# Patient Record
Sex: Female | Born: 1960
Health system: Southern US, Community
[De-identification: ages and names within clinical notes are randomized; demographics above are authoritative.]

## PROBLEM LIST (undated history)

## (undated) DIAGNOSIS — E049 Nontoxic goiter, unspecified: Secondary | ICD-10-CM

## (undated) DIAGNOSIS — E785 Hyperlipidemia, unspecified: Secondary | ICD-10-CM

## (undated) DIAGNOSIS — N189 Chronic kidney disease, unspecified: Secondary | ICD-10-CM

## (undated) DIAGNOSIS — M1990 Unspecified osteoarthritis, unspecified site: Secondary | ICD-10-CM

## (undated) DIAGNOSIS — R030 Elevated blood-pressure reading, without diagnosis of hypertension: Secondary | ICD-10-CM

## (undated) DIAGNOSIS — K449 Diaphragmatic hernia without obstruction or gangrene: Secondary | ICD-10-CM

## (undated) DIAGNOSIS — E079 Disorder of thyroid, unspecified: Secondary | ICD-10-CM

## (undated) DIAGNOSIS — H43393 Other vitreous opacities, bilateral: Secondary | ICD-10-CM

## (undated) DIAGNOSIS — M7070 Other bursitis of hip, unspecified hip: Secondary | ICD-10-CM

## (undated) DIAGNOSIS — K219 Gastro-esophageal reflux disease without esophagitis: Secondary | ICD-10-CM

## (undated) DIAGNOSIS — N8003 Adenomyosis of the uterus: Secondary | ICD-10-CM

## (undated) DIAGNOSIS — D126 Benign neoplasm of colon, unspecified: Secondary | ICD-10-CM

## (undated) DIAGNOSIS — N200 Calculus of kidney: Secondary | ICD-10-CM

## (undated) DIAGNOSIS — J302 Other seasonal allergic rhinitis: Secondary | ICD-10-CM

## (undated) DIAGNOSIS — L940 Localized scleroderma [morphea]: Secondary | ICD-10-CM

## (undated) DIAGNOSIS — T7840XA Allergy, unspecified, initial encounter: Secondary | ICD-10-CM

## (undated) DIAGNOSIS — H35719 Central serous chorioretinopathy, unspecified eye: Secondary | ICD-10-CM

## (undated) DIAGNOSIS — F419 Anxiety disorder, unspecified: Secondary | ICD-10-CM

## (undated) DIAGNOSIS — D649 Anemia, unspecified: Secondary | ICD-10-CM

## (undated) DIAGNOSIS — N800 Endometriosis of uterus: Secondary | ICD-10-CM

## (undated) DIAGNOSIS — N809 Endometriosis, unspecified: Secondary | ICD-10-CM

## (undated) DIAGNOSIS — K589 Irritable bowel syndrome without diarrhea: Secondary | ICD-10-CM

## (undated) DIAGNOSIS — I10 Essential (primary) hypertension: Secondary | ICD-10-CM

## (undated) HISTORY — DX: Central serous chorioretinopathy, unspecified eye: H35.719

## (undated) HISTORY — DX: Irritable bowel syndrome, unspecified: K58.9

## (undated) HISTORY — DX: Chronic kidney disease, unspecified: N18.9

## (undated) HISTORY — PX: WISDOM TOOTH EXTRACTION: SHX21

## (undated) HISTORY — DX: Anxiety disorder, unspecified: F41.9

## (undated) HISTORY — DX: Allergy, unspecified, initial encounter: T78.40XA

## (undated) HISTORY — PX: UPPER GI ENDOSCOPY: SHX6162

## (undated) HISTORY — DX: Nontoxic goiter, unspecified: E04.9

## (undated) HISTORY — DX: Gastro-esophageal reflux disease without esophagitis: K21.9

## (undated) HISTORY — DX: Anemia, unspecified: D64.9

## (undated) HISTORY — DX: Hyperlipidemia, unspecified: E78.5

## (undated) HISTORY — DX: Diaphragmatic hernia without obstruction or gangrene: K44.9

## (undated) HISTORY — PX: TONSILLECTOMY: SUR1361

## (undated) HISTORY — DX: Elevated blood-pressure reading, without diagnosis of hypertension: R03.0

## (undated) HISTORY — DX: Endometriosis of uterus: N80.0

## (undated) HISTORY — DX: Benign neoplasm of colon, unspecified: D12.6

## (undated) HISTORY — PX: COLONOSCOPY: SHX174

## (undated) HISTORY — DX: Essential (primary) hypertension: I10

## (undated) HISTORY — DX: Disorder of thyroid, unspecified: E07.9

## (undated) HISTORY — DX: Adenomyosis of the uterus: N80.03

## (undated) HISTORY — DX: Localized scleroderma (morphea): L94.0

## (undated) HISTORY — DX: Calculus of kidney: N20.0

## (undated) HISTORY — PX: POLYPECTOMY: SHX149

## (undated) HISTORY — DX: Endometriosis, unspecified: N80.9

## (undated) HISTORY — DX: Unspecified osteoarthritis, unspecified site: M19.90

## (undated) HISTORY — PX: MENISCUS REPAIR: SHX5179

---

## 1998-06-19 ENCOUNTER — Other Ambulatory Visit: Admission: RE | Admit: 1998-06-19 | Discharge: 1998-06-19 | Payer: Self-pay | Admitting: Obstetrics and Gynecology

## 1999-01-26 ENCOUNTER — Ambulatory Visit (HOSPITAL_COMMUNITY): Admission: RE | Admit: 1999-01-26 | Discharge: 1999-01-26 | Payer: Self-pay | Admitting: Family Medicine

## 1999-01-26 ENCOUNTER — Encounter: Payer: Self-pay | Admitting: Family Medicine

## 2000-04-15 ENCOUNTER — Other Ambulatory Visit: Admission: RE | Admit: 2000-04-15 | Discharge: 2000-04-15 | Payer: Self-pay | Admitting: Obstetrics and Gynecology

## 2001-07-10 ENCOUNTER — Encounter: Payer: Self-pay | Admitting: Obstetrics and Gynecology

## 2001-07-10 ENCOUNTER — Ambulatory Visit (HOSPITAL_COMMUNITY): Admission: RE | Admit: 2001-07-10 | Discharge: 2001-07-10 | Payer: Self-pay | Admitting: Obstetrics and Gynecology

## 2002-06-02 ENCOUNTER — Ambulatory Visit (HOSPITAL_COMMUNITY): Admission: RE | Admit: 2002-06-02 | Discharge: 2002-06-02 | Payer: Self-pay | Admitting: Obstetrics and Gynecology

## 2002-06-02 ENCOUNTER — Encounter: Payer: Self-pay | Admitting: Obstetrics and Gynecology

## 2003-05-24 ENCOUNTER — Encounter: Payer: Self-pay | Admitting: Obstetrics and Gynecology

## 2003-05-24 ENCOUNTER — Ambulatory Visit (HOSPITAL_COMMUNITY): Admission: RE | Admit: 2003-05-24 | Discharge: 2003-05-24 | Payer: Self-pay | Admitting: Obstetrics and Gynecology

## 2003-06-15 ENCOUNTER — Ambulatory Visit (HOSPITAL_COMMUNITY): Admission: RE | Admit: 2003-06-15 | Discharge: 2003-06-15 | Payer: Self-pay | Admitting: Obstetrics and Gynecology

## 2003-06-15 ENCOUNTER — Encounter: Payer: Self-pay | Admitting: Obstetrics and Gynecology

## 2004-07-11 ENCOUNTER — Ambulatory Visit (HOSPITAL_COMMUNITY): Admission: RE | Admit: 2004-07-11 | Discharge: 2004-07-11 | Payer: Self-pay | Admitting: Obstetrics and Gynecology

## 2005-08-01 ENCOUNTER — Ambulatory Visit (HOSPITAL_COMMUNITY): Admission: RE | Admit: 2005-08-01 | Discharge: 2005-08-01 | Payer: Self-pay | Admitting: Obstetrics and Gynecology

## 2006-08-11 ENCOUNTER — Ambulatory Visit (HOSPITAL_COMMUNITY): Admission: RE | Admit: 2006-08-11 | Discharge: 2006-08-11 | Payer: Self-pay | Admitting: Obstetrics and Gynecology

## 2007-08-13 ENCOUNTER — Ambulatory Visit (HOSPITAL_COMMUNITY): Admission: RE | Admit: 2007-08-13 | Discharge: 2007-08-13 | Payer: Self-pay | Admitting: Obstetrics and Gynecology

## 2008-09-13 ENCOUNTER — Ambulatory Visit (HOSPITAL_COMMUNITY): Admission: RE | Admit: 2008-09-13 | Discharge: 2008-09-13 | Payer: Self-pay | Admitting: Obstetrics and Gynecology

## 2008-09-28 ENCOUNTER — Encounter: Admission: RE | Admit: 2008-09-28 | Discharge: 2008-09-28 | Payer: Self-pay | Admitting: Obstetrics and Gynecology

## 2009-10-16 ENCOUNTER — Ambulatory Visit (HOSPITAL_COMMUNITY): Admission: RE | Admit: 2009-10-16 | Discharge: 2009-10-16 | Payer: Self-pay | Admitting: Obstetrics and Gynecology

## 2010-06-27 ENCOUNTER — Encounter: Admission: RE | Admit: 2010-06-27 | Discharge: 2010-06-27 | Payer: Self-pay | Admitting: Family Medicine

## 2010-10-30 ENCOUNTER — Ambulatory Visit (HOSPITAL_COMMUNITY)
Admission: RE | Admit: 2010-10-30 | Discharge: 2010-10-30 | Payer: Self-pay | Source: Home / Self Care | Attending: Obstetrics and Gynecology | Admitting: Obstetrics and Gynecology

## 2011-01-03 ENCOUNTER — Encounter (INDEPENDENT_AMBULATORY_CARE_PROVIDER_SITE_OTHER): Payer: Self-pay | Admitting: *Deleted

## 2011-01-08 NOTE — Letter (Signed)
Summary: New Patient letter  [facility]  [address]   [place], [place] [address]   Phone: [phone]  Fax: [phone]       01/03/2011 MRN: [id number]  [facility] [address] [place], [place]  [address]  Dear Ms. Crissman,  Welcome to the Gastroenterology Division at [facility].    You are scheduled to see Dr.  Russella Dar on 02/15/2011 at 3:30 on the 3rd floor at [facility], [address]  We ask that you try to arrive at our office 15 minutes prior to your appointment time to allow for check-in.  We would like you to complete the enclosed self-administered evaluation form prior to your visit and bring it with you on the day of your appointment.  We will review it with you.  Also, please bring a complete list of all your medications or, if you prefer, bring the medication bottles and we will list them.  Please bring your insurance card so that we may make a copy of it.  If your insurance requires a referral to see a specialist, please bring your referral form from your primary care physician.  Co-payments are due at the time of your visit and may be paid by cash, check or credit card.     Your office visit will consist of a consult with your physician (includes a physical exam), any laboratory testing he/she may order, scheduling of any necessary diagnostic testing (e.g. x-ray, ultrasound, CT-scan), and scheduling of a procedure (e.g. Endoscopy, Colonoscopy) if required.  Please allow enough time on your schedule to allow for any/all of these possibilities.    If you cannot keep your appointment, please call [phone] to cancel or reschedule prior to your appointment date.  This allows [place] the opportunity to schedule an appointment for another patient in need of care.  If you do not cancel or reschedule by 5 p.m. the business day prior to your appointment date, you will be charged a $50.00 late cancellation/no-show fee.    Thank you for choosing [facility] for your medical needs.  We appreciate the opportunity to care for you.  Please visit [place] at our website  to learn more about our practice.                     Sincerely,                                                             The Gastroenterology Division

## 2011-02-15 ENCOUNTER — Ambulatory Visit: Payer: Self-pay | Admitting: Gastroenterology

## 2011-03-15 ENCOUNTER — Telehealth: Payer: Self-pay | Admitting: Gastroenterology

## 2011-03-15 NOTE — Telephone Encounter (Signed)
Wanted to make sure patient has not had a recent work up for her abdominal pain. The reason we have for her coming in to the office next week was consult for gallbladder. Patient states she has not had a recent work up and the only work up she has had was with [place] in 1995.

## 2011-03-20 ENCOUNTER — Ambulatory Visit (INDEPENDENT_AMBULATORY_CARE_PROVIDER_SITE_OTHER): Payer: BC Managed Care – PPO | Admitting: Gastroenterology

## 2011-03-20 ENCOUNTER — Encounter: Payer: Self-pay | Admitting: Gastroenterology

## 2011-03-20 VITALS — BP 124/80 | HR 72 | Ht 62.0 in | Wt 126.0 lb

## 2011-03-20 DIAGNOSIS — R1011 Right upper quadrant pain: Secondary | ICD-10-CM

## 2011-03-20 DIAGNOSIS — Z8371 Family history of colonic polyps: Secondary | ICD-10-CM

## 2011-03-20 DIAGNOSIS — Z800 Family history of malignant neoplasm of digestive organs: Secondary | ICD-10-CM

## 2011-03-20 MED ORDER — HYOSCYAMINE SULFATE 0.125 MG SL SUBL: SUBLINGUAL_TABLET | SUBLINGUAL | Status: DC

## 2011-03-20 MED ORDER — PEG-KCL-NACL-NASULF-NA ASC-C 100 G PO SOLR: 1.0000 | Freq: Once | ORAL | Status: AC

## 2011-03-20 NOTE — Patient Instructions (Addendum)
You have been scheduled for a Colonoscopy. Separate instructions given to patient. You have also been scheduled for a Abdominal Ultrasound at [facility] on 03/25/11 at 8:30am. Nothing to eat or drink after midnight.  Your prescription has been sent to your pharmacy.  cc. Leonides Sake, MD

## 2011-03-20 NOTE — Progress Notes (Signed)
History of Present Illness: This is a 50 year old woman that I seen in the past. In addition she has been by Dr. Garnette Czech our. He has chronic right-sided abdominal pain that radiates along her right costal margin to her right flank and back. Her symptoms are sometimes associated with abdominal bloating and belching. They have been fairly constant problem for over 20 years. Around 1990 she was evaluated with an oral cholecystogram a HIDA scan and upper endoscopy small bowel series and a CT scan of the abdomen and pelvis which were all unremarkable. She was reevaluated in 1994 with an abdominal ultrasound and a CCP I an ERCP which were all unremarkable. Her symptoms slightly with anti-spasmodic. There are frequently improved when she lays on her right side. They still correlate with bloating and belching occasionally but occasionally occur on her own. Her symptoms are exacerbated by sweet tea, nuts and fried foods. She has mild chronic constipation. She has had several colonoscopies by Dr. West Carbo due to family history of colon polyps and colon cancer. Her last colonoscopy was was in 2007 she states she is due for followup. She denies vomiting, dysphagia, odynophagia, change in bowel habits, change in stool caliber, melena and hematochezia.  . Past Medical History  Diagnosis Date  . IBS (irritable bowel syndrome)   . Hiatal hernia   . Hemorrhoids    Past Surgical History  Procedure Date  . Tonsillectomy     reports that she has never smoked. She has never used smokeless tobacco. She reports that she drinks alcohol. She reports that she does not use illicit drugs. family history includes Colon cancer in her paternal grandfather; Colon polyps in her brother; and Diabetes in her father. No Known Allergies  Outpatient Encounter Prescriptions as of 03/20/2011  Medication Sig Dispense Refill  . KRILL OIL PO Take by mouth daily.        . Multiple Vitamin (MULTIVITAMIN PO) Take by mouth daily.          Marland Kitchen VITAMIN D, ERGOCALCIFEROL, PO Take by mouth daily.        . hyoscyamine (LEVSIN/SL) 0.125 MG SL tablet 1-2 tablets by mouth four times a day as needed  100 tablet  5  . peg 3350 powder (MOVIPREP) 100 G SOLR Take 1 kit (100 g total) by mouth once.  1 kit  0    Review of Systems: Pertinent positive and negative review of systems were noted in the above HPI section. All other review of systems were otherwise negative.  Physical Exam: General: Well developed , well nourished, no acute distress Head: Normocephalic and atraumatic Eyes:  sclerae anicteric, EOMI Ears: Normal auditory acuity Mouth: No deformity or lesions Neck: Supple, no masses or thyromegaly Lungs: Clear throughout to auscultation. Mild tenderness along her right lateral costal margin Heart: Regular rate and rhythm; no murmurs, rubs or bruits Abdomen: Soft, non tender and non distended. No masses, hepatosplenomegaly or hernias noted. Normal Bowel sounds Rectal: Deferred to colonoscopy Musculoskeletal: Symmetrical with no gross deformities  Skin: No lesions on visible extremities Pulses:  Normal pulses noted Extremities: No clubbing, cyanosis, edema or deformities noted Neurological: Alert oriented x 4, grossly nonfocal Cervical Nodes:  No significant cervical adenopathy Inguinal Nodes: No significant inguinal adenopathy Psychological:  Alert and cooperative. Normal mood and affect  Assessment and Recommendations:  1. Chronic right upper quadrant, right costal margin, right flank and right back pain. Her symptoms are occasionally exacerbated by certain dietary stressors and gas. The etiology remains unclear. She  may have a component of abdominal wall pain and/or intestinal gas with bowel spasm. Given that it has been many years since she was evaluated will proceed with an abdominal ultrasound to rule out cholelithiasis and other lesions. Consider a repeat [facility] HIDA scan. Trial of hyoscyamine 1 to 2, 4 times a day when  necessary. Trial of Advil 400-600 mg 3 times a day when necessary  2. Family history of colon polyps in her brother and family history of colon cancer in her paternal grandfather. She has been maintained on a 5 year screening colonoscopy program. Schedule colonoscopy. The risks, benefits, and alternatives to colonoscopy with possible biopsy and possible polypectomy were discussed with the patient and they consent to proceed.

## 2011-03-21 ENCOUNTER — Encounter: Payer: Self-pay | Admitting: Gastroenterology

## 2011-03-25 ENCOUNTER — Ambulatory Visit (HOSPITAL_COMMUNITY)
Admission: RE | Admit: 2011-03-25 | Discharge: 2011-03-25 | Disposition: A | Discharge status: Home / Self Care | Payer: BC Managed Care – PPO | Source: Ambulatory Visit | Attending: Gastroenterology | Admitting: Gastroenterology

## 2011-03-25 DIAGNOSIS — R1011 Right upper quadrant pain: Secondary | ICD-10-CM

## 2011-04-21 DIAGNOSIS — D126 Benign neoplasm of colon, unspecified: Secondary | ICD-10-CM

## 2011-04-21 HISTORY — DX: Benign neoplasm of colon, unspecified: D12.6

## 2011-05-03 ENCOUNTER — Other Ambulatory Visit: Payer: BC Managed Care – PPO | Admitting: Gastroenterology

## 2011-05-03 ENCOUNTER — Encounter: Payer: Self-pay | Admitting: Gastroenterology

## 2011-05-03 ENCOUNTER — Ambulatory Visit (AMBULATORY_SURGERY_CENTER): Payer: BC Managed Care – PPO | Admitting: Gastroenterology

## 2011-05-03 DIAGNOSIS — Z800 Family history of malignant neoplasm of digestive organs: Secondary | ICD-10-CM

## 2011-05-03 DIAGNOSIS — D126 Benign neoplasm of colon, unspecified: Secondary | ICD-10-CM

## 2011-05-03 DIAGNOSIS — K648 Other hemorrhoids: Secondary | ICD-10-CM

## 2011-05-03 DIAGNOSIS — Z8371 Family history of colonic polyps: Secondary | ICD-10-CM

## 2011-05-03 DIAGNOSIS — Z1211 Encounter for screening for malignant neoplasm of colon: Secondary | ICD-10-CM

## 2011-05-03 DIAGNOSIS — R1011 Right upper quadrant pain: Secondary | ICD-10-CM

## 2011-05-03 MED ORDER — SODIUM CHLORIDE 0.9 % IV SOLN: 500.0000 mL | INTRAVENOUS | Status: DC

## 2011-05-03 NOTE — Progress Notes (Signed)
PT STATES THE PREP HAS IRRITATED HER HEMORRHOIDS AND THEY ARE LARGE AND BLEEDING.   E Matteus Mcnelly RN  In RR, pt not able to pass air while in stretcher.  Pt assisted up to BR with husband to attempt to pass air while on toilet.  C/O cramping to abd, rating as a "5."  Pt and husband state that she has been "belching a lot but not farting."  Levsin 2 tablets SL given.  Pt taken back to bay 6.  Pt pointed to upper abd, states, "It doesn't hurt, just feels like cramping, trapped air is there."  Warm tea given.  Writer discussed option of going home, trying to get air there or staying at [facility] to pass air.  Pt voices that she wants to go home.  Abd. Soft, palpable, nondistended.  Pt and husband told to call back if needed

## 2011-05-03 NOTE — Patient Instructions (Signed)
Please review discharge instructions  Please review information about polyps, hemorrhoids, and high fiber diets

## 2011-05-06 ENCOUNTER — Telehealth: Payer: Self-pay

## 2011-05-06 NOTE — Telephone Encounter (Signed)

## 2011-05-07 ENCOUNTER — Encounter: Payer: Self-pay | Admitting: Gastroenterology

## 2011-10-09 ENCOUNTER — Other Ambulatory Visit (HOSPITAL_COMMUNITY): Payer: Self-pay | Admitting: Obstetrics and Gynecology

## 2011-10-09 DIAGNOSIS — Z1231 Encounter for screening mammogram for malignant neoplasm of breast: Secondary | ICD-10-CM

## 2011-11-13 ENCOUNTER — Ambulatory Visit (HOSPITAL_COMMUNITY)
Admission: RE | Admit: 2011-11-13 | Discharge: 2011-11-13 | Disposition: A | Discharge status: Home / Self Care | Payer: BC Managed Care – PPO | Source: Ambulatory Visit | Attending: Obstetrics and Gynecology | Admitting: Obstetrics and Gynecology

## 2011-11-13 DIAGNOSIS — Z1231 Encounter for screening mammogram for malignant neoplasm of breast: Secondary | ICD-10-CM

## 2012-07-02 ENCOUNTER — Other Ambulatory Visit (INDEPENDENT_AMBULATORY_CARE_PROVIDER_SITE_OTHER): Payer: BC Managed Care – PPO

## 2012-07-02 ENCOUNTER — Ambulatory Visit (INDEPENDENT_AMBULATORY_CARE_PROVIDER_SITE_OTHER): Payer: BC Managed Care – PPO | Admitting: Gastroenterology

## 2012-07-02 ENCOUNTER — Encounter: Payer: Self-pay | Admitting: Gastroenterology

## 2012-07-02 VITALS — BP 120/80 | HR 88 | Ht 62.5 in | Wt 123.0 lb

## 2012-07-02 DIAGNOSIS — R1031 Right lower quadrant pain: Secondary | ICD-10-CM

## 2012-07-02 DIAGNOSIS — Z86010 Personal history of colonic polyps: Secondary | ICD-10-CM

## 2012-07-02 LAB — CBC WITH DIFFERENTIAL/PLATELET
Basophils Relative: 0.4 % (ref 0.0–3.0)
Eosinophils Absolute: 0.1 10*3/uL (ref 0.0–0.7)
Hemoglobin: 13.7 g/dL (ref 12.0–15.0)
Lymphocytes Relative: 27.1 % (ref 12.0–46.0)
MCHC: 32.8 g/dL (ref 30.0–36.0)
Monocytes Relative: 9.1 % (ref 3.0–12.0)
Neutrophils Relative %: 61.5 % (ref 43.0–77.0)
RBC: 4.82 Mil/uL (ref 3.87–5.11)
WBC: 5.9 10*3/uL (ref 4.5–10.5)

## 2012-07-02 LAB — COMPREHENSIVE METABOLIC PANEL
ALT: 13 U/L (ref 0–35)
AST: 19 U/L (ref 0–37)
Albumin: 4.1 g/dL (ref 3.5–5.2)
BUN: 15 mg/dL (ref 6–23)
CO2: 21 mEq/L (ref 19–32)
Calcium: 9.4 mg/dL (ref 8.4–10.5)
Chloride: 108 mEq/L (ref 96–112)
GFR: 84.28 mL/min (ref >60.00)
Potassium: 4.5 mEq/L (ref 3.5–5.1)

## 2012-07-02 MED ORDER — GLYCOPYRROLATE 1 MG PO TABS: 1.0000 mg | ORAL_TABLET | Freq: Two times a day (BID) | ORAL | Status: DC

## 2012-07-02 MED ORDER — OMEPRAZOLE 20 MG PO CPDR: 20.0000 mg | DELAYED_RELEASE_CAPSULE | Freq: Every day | ORAL | Status: DC

## 2012-07-02 MED ORDER — HYOSCYAMINE SULFATE 0.125 MG SL SUBL: SUBLINGUAL_TABLET | SUBLINGUAL | Status: DC

## 2012-07-02 NOTE — Progress Notes (Signed)
History of Present Illness: This is a 51 year old female with chronic right sided abd pain, flank pain and back pain. She has had the symptoms for over 20 years. Symptoms were more bothersome several weeks ago. She notes that her symptoms are exacerbated by certain foods and stress. She relates similar symptoms as she did last year when I saw her. She does not recall trying hyoscyamine as prescribed her symptoms. Her symptoms are primarily in the right lower quadrant, right lower flight and right back. She describes a burning, stretching sensation fairly constantly which is exacerbated by lying in bed on the opposite side, exacerbated by certain seated positions and exacerbated by bowel movements. She's been under more stress recently and notes her stools have been slightly looser. An abdominal ultrasound in 2012 was unremarkable. A colonoscopy in July 2012 showed a small adenomatous colon polyp and hemorrhoids. She relates frequent belching no heartburn or regurgitation.  Current Medications, Allergies, Past Medical History, Past Surgical History, Family History and Social History were reviewed in [employer] record.  Physical Exam: General: Well developed , well nourished, no acute distress Head: Normocephalic and atraumatic Eyes:  sclerae anicteric, EOMI Ears: Normal auditory acuity Mouth: No deformity or lesions Lungs: Clear throughout to auscultation Heart: Regular rate and rhythm; no murmurs, rubs or bruits Abdomen: Soft, non tender and non distended. Mild right lower flank tenderness. No masses, hepatosplenomegaly or hernias noted. Normal Bowel sounds Musculoskeletal: Symmetrical with no gross deformities  Pulses:  Normal pulses noted Extremities: No clubbing, cyanosis, edema or deformities noted Neurological: Alert oriented x 4, grossly nonfocal Psychological:  Alert and cooperative. Normal mood and affect  Assessment and Recommendations:  1. Chronic right sided,  right flank and right back pain. Her symptoms are exacerbated by certain foods, stress, certain positions and gas. She has frequent belching. She probably has abdominal wall pain, IBS and gas. She may have GERD manifested by frequent belching. An abdominal ultrasound last year was negative. Schedule abd/plevic CT and obtain blood work. Consider a repeat [facility] HIDA scan and an EGD if symptoms not improved at her return visit. Trial of glycopyrrolate bid, omeprazole daily and hyoscyamine 1 to 2, 4 times a day when necessary. Trial of Advil 400-600 mg 3 times a day when necessary.   2. Family history of colon polyps in her brother, family history of colon cancer in her paternal grandfather and personal history of adenomatous colon polyps. She has had 5 year interval screening colonoscopies. Surveillance colonoscopy in 02/2016.

## 2012-07-02 NOTE — Patient Instructions (Signed)
Your physician has requested that you go to the basement for the following lab work before leaving today: TSH, Cmet, Celiac panel, CBC.  We have sent the following medications to your pharmacy for you to pick up at your convenience:Robinul, Omeprazole, Levsin.   You have been scheduled for a CT scan of the abdomen and pelvis at [facility] ([address]---this is in the same building as Architectural technologist).   You are scheduled on 07/03/12 at 3:00pm. You should arrive 15 minutes prior to your appointment time for registration. Please follow the written instructions below on the day of your exam:  WARNING: IF YOU ARE ALLERGIC TO IODINE/X-RAY DYE, PLEASE NOTIFY RADIOLOGY IMMEDIATELY AT [phone]! YOU WILL BE GIVEN A 13 HOUR PREMEDICATION PREP.  1) Do not eat or drink anything after 11:00am (4 hours prior to your test) 2) You have been given 2 bottles of oral contrast to drink. The solution may taste better if refrigerated, but do NOT add ice or any other liquid to this solution. Shake well before drinking.    Drink 1 bottle of contrast @ 1:00pm (2 hours prior to your exam)  Drink 1 bottle of contrast @ 2:00pm (1 hour prior to your exam)  You may take any medications as prescribed with a small amount of water except for the following: Metformin, Glucophage, Glucovance, Avandamet, Riomet, Fortamet, Actoplus Met, Janumet, Glumetza or Metaglip. The above medications must be held the day of the exam AND 48 hours after the exam.  The purpose of you drinking the oral contrast is to aid in the visualization of your intestinal tract. The contrast solution may cause some diarrhea. Before your exam is started, you will be given a small amount of fluid to drink. Depending on your individual set of symptoms, you may also receive an intravenous injection of x-ray contrast/dye. Plan on being at [facility] for 30 minutes or long, depending on the type of exam you are having performed.  If  you have any questions regarding your exam or if you need to reschedule, you may call the CT department at [phone] between the hours of 8:00 am and 5:00 pm, Monday-Friday.  ________________________________________________________________________  cc: Johny Blamer, MD

## 2012-07-03 ENCOUNTER — Ambulatory Visit (INDEPENDENT_AMBULATORY_CARE_PROVIDER_SITE_OTHER)
Admission: RE | Admit: 2012-07-03 | Discharge: 2012-07-03 | Disposition: A | Discharge status: Home / Self Care | Payer: BC Managed Care – PPO | Source: Ambulatory Visit | Attending: Gastroenterology | Admitting: Gastroenterology

## 2012-07-03 DIAGNOSIS — R1031 Right lower quadrant pain: Secondary | ICD-10-CM

## 2012-07-03 LAB — CELIAC PANEL 10
Gliadin IgA: 4 U/mL (ref <20)
IgA: 127 mg/dL (ref 69–380)
Tissue Transglut Ab: 4.8 U/mL (ref <20)

## 2012-07-03 MED ORDER — IOHEXOL 300 MG/ML  SOLN
100.0000 mL | Freq: Once | INTRAMUSCULAR | Status: AC | PRN
  Administered 2012-07-03: 100 mL via INTRAVENOUS

## 2012-07-07 ENCOUNTER — Telehealth: Payer: Self-pay | Admitting: Gastroenterology

## 2012-07-07 NOTE — Telephone Encounter (Signed)
Patient advised of the results 

## 2012-08-03 ENCOUNTER — Ambulatory Visit: Payer: BC Managed Care – PPO | Admitting: Gastroenterology

## 2012-08-31 ENCOUNTER — Ambulatory Visit: Payer: BC Managed Care – PPO | Admitting: Gastroenterology

## 2012-09-14 ENCOUNTER — Ambulatory Visit: Payer: BC Managed Care – PPO | Admitting: Gastroenterology

## 2012-09-29 ENCOUNTER — Other Ambulatory Visit: Payer: Self-pay | Admitting: Otolaryngology

## 2012-09-29 DIAGNOSIS — J329 Chronic sinusitis, unspecified: Secondary | ICD-10-CM

## 2012-09-29 DIAGNOSIS — R0981 Nasal congestion: Secondary | ICD-10-CM

## 2012-09-30 ENCOUNTER — Ambulatory Visit
Admission: RE | Admit: 2012-09-30 | Discharge: 2012-09-30 | Disposition: A | Discharge status: Home / Self Care | Payer: BC Managed Care – PPO | Source: Ambulatory Visit | Attending: Otolaryngology | Admitting: Otolaryngology

## 2012-09-30 DIAGNOSIS — R0981 Nasal congestion: Secondary | ICD-10-CM

## 2012-09-30 DIAGNOSIS — J329 Chronic sinusitis, unspecified: Secondary | ICD-10-CM

## 2012-10-19 ENCOUNTER — Other Ambulatory Visit (HOSPITAL_COMMUNITY): Payer: Self-pay | Admitting: Obstetrics and Gynecology

## 2012-10-19 DIAGNOSIS — Z1231 Encounter for screening mammogram for malignant neoplasm of breast: Secondary | ICD-10-CM

## 2012-10-23 ENCOUNTER — Ambulatory Visit: Payer: BC Managed Care – PPO | Admitting: Gastroenterology

## 2012-11-11 ENCOUNTER — Other Ambulatory Visit: Payer: Self-pay | Admitting: Otolaryngology

## 2012-11-11 DIAGNOSIS — R52 Pain, unspecified: Secondary | ICD-10-CM

## 2012-11-13 ENCOUNTER — Ambulatory Visit (HOSPITAL_COMMUNITY)
Admission: RE | Admit: 2012-11-13 | Discharge: 2012-11-13 | Disposition: A | Discharge status: Home / Self Care | Payer: BC Managed Care – PPO | Source: Ambulatory Visit | Attending: Obstetrics and Gynecology | Admitting: Obstetrics and Gynecology

## 2012-11-13 DIAGNOSIS — Z1231 Encounter for screening mammogram for malignant neoplasm of breast: Secondary | ICD-10-CM

## 2012-11-16 ENCOUNTER — Ambulatory Visit
Admission: RE | Admit: 2012-11-16 | Discharge: 2012-11-16 | Disposition: A | Discharge status: Home / Self Care | Payer: BC Managed Care – PPO | Source: Ambulatory Visit | Attending: Otolaryngology | Admitting: Otolaryngology

## 2012-11-16 ENCOUNTER — Other Ambulatory Visit: Payer: Self-pay | Admitting: Obstetrics and Gynecology

## 2012-11-16 DIAGNOSIS — R52 Pain, unspecified: Secondary | ICD-10-CM

## 2012-11-16 DIAGNOSIS — R928 Other abnormal and inconclusive findings on diagnostic imaging of breast: Secondary | ICD-10-CM

## 2012-11-16 MED ORDER — IOHEXOL 300 MG/ML  SOLN
75.0000 mL | Freq: Once | INTRAMUSCULAR | Status: AC | PRN
  Administered 2012-11-16: 75 mL via INTRAVENOUS

## 2012-11-20 ENCOUNTER — Ambulatory Visit
Admission: RE | Admit: 2012-11-20 | Discharge: 2012-11-20 | Disposition: A | Discharge status: Home / Self Care | Payer: BC Managed Care – PPO | Source: Ambulatory Visit | Attending: Obstetrics and Gynecology | Admitting: Obstetrics and Gynecology

## 2012-11-20 DIAGNOSIS — R928 Other abnormal and inconclusive findings on diagnostic imaging of breast: Secondary | ICD-10-CM

## 2012-11-30 ENCOUNTER — Ambulatory Visit: Payer: BC Managed Care – PPO | Admitting: Gastroenterology

## 2013-02-23 ENCOUNTER — Other Ambulatory Visit: Payer: Self-pay | Admitting: Obstetrics and Gynecology

## 2013-02-23 DIAGNOSIS — N63 Unspecified lump in unspecified breast: Secondary | ICD-10-CM

## 2013-02-23 DIAGNOSIS — M79621 Pain in right upper arm: Secondary | ICD-10-CM

## 2013-04-27 ENCOUNTER — Ambulatory Visit
Admission: RE | Admit: 2013-04-27 | Discharge: 2013-04-27 | Disposition: A | Discharge status: Home / Self Care | Payer: BC Managed Care – PPO | Source: Ambulatory Visit | Attending: Obstetrics and Gynecology | Admitting: Obstetrics and Gynecology

## 2013-04-27 DIAGNOSIS — N63 Unspecified lump in unspecified breast: Secondary | ICD-10-CM

## 2013-04-27 DIAGNOSIS — M79621 Pain in right upper arm: Secondary | ICD-10-CM

## 2013-09-14 ENCOUNTER — Other Ambulatory Visit: Payer: Self-pay | Admitting: Physician Assistant

## 2013-09-24 ENCOUNTER — Other Ambulatory Visit: Payer: Self-pay | Admitting: Obstetrics and Gynecology

## 2013-09-24 DIAGNOSIS — N63 Unspecified lump in unspecified breast: Secondary | ICD-10-CM

## 2013-10-29 ENCOUNTER — Ambulatory Visit
Admission: RE | Admit: 2013-10-29 | Discharge: 2013-10-29 | Disposition: A | Discharge status: Home / Self Care | Payer: BC Managed Care – PPO | Source: Ambulatory Visit | Attending: Obstetrics and Gynecology | Admitting: Obstetrics and Gynecology

## 2013-10-29 DIAGNOSIS — N63 Unspecified lump in unspecified breast: Secondary | ICD-10-CM

## 2013-12-21 ENCOUNTER — Other Ambulatory Visit: Payer: Self-pay | Admitting: Neurosurgery

## 2013-12-21 DIAGNOSIS — Q762 Congenital spondylolisthesis: Secondary | ICD-10-CM

## 2013-12-26 ENCOUNTER — Ambulatory Visit
Admission: RE | Admit: 2013-12-26 | Discharge: 2013-12-26 | Disposition: A | Discharge status: Home / Self Care | Payer: BC Managed Care – PPO | Source: Ambulatory Visit | Attending: Neurosurgery | Admitting: Neurosurgery

## 2013-12-26 DIAGNOSIS — Q762 Congenital spondylolisthesis: Secondary | ICD-10-CM

## 2014-01-06 ENCOUNTER — Other Ambulatory Visit: Payer: Self-pay | Admitting: Neurosurgery

## 2014-01-12 ENCOUNTER — Encounter (HOSPITAL_COMMUNITY): Payer: Self-pay | Admitting: Pharmacy Technician

## 2014-01-21 ENCOUNTER — Encounter (HOSPITAL_COMMUNITY)
Admission: RE | Admit: 2014-01-21 | Discharge: 2014-01-21 | Disposition: A | Discharge status: Home / Self Care | Payer: BC Managed Care – PPO | Source: Ambulatory Visit | Attending: Neurosurgery | Admitting: Neurosurgery

## 2014-01-21 ENCOUNTER — Encounter (HOSPITAL_COMMUNITY): Payer: Self-pay

## 2014-01-21 DIAGNOSIS — Z01812 Encounter for preprocedural laboratory examination: Secondary | ICD-10-CM | POA: Insufficient documentation

## 2014-01-21 HISTORY — DX: Other seasonal allergic rhinitis: J30.2

## 2014-01-21 LAB — BASIC METABOLIC PANEL
BUN: 15 mg/dL (ref 6–23)
CO2: 23 mEq/L (ref 19–32)
Calcium: 9.4 mg/dL (ref 8.4–10.5)
Chloride: 106 mEq/L (ref 96–112)
Creatinine, Ser: 0.69 mg/dL (ref 0.50–1.10)
GFR calc Af Amer: >90 mL/min (ref >90)
Glucose, Bld: 102 mg/dL — ABNORMAL HIGH (ref 70–99)
POTASSIUM: 4.6 meq/L (ref 3.7–5.3)
SODIUM: 142 meq/L (ref 137–147)

## 2014-01-21 LAB — CBC
HCT: 38.3 % (ref 36.0–46.0)
Hemoglobin: 12.2 g/dL (ref 12.0–15.0)
MCH: 26.7 pg (ref 26.0–34.0)
MCHC: 31.9 g/dL (ref 30.0–36.0)
MCV: 83.8 fL (ref 78.0–100.0)
PLATELETS: 243 10*3/uL (ref 150–400)
RBC: 4.57 MIL/uL (ref 3.87–5.11)
RDW: 16 % — ABNORMAL HIGH (ref 11.5–15.5)
WBC: 4.8 10*3/uL (ref 4.0–10.5)

## 2014-01-21 LAB — HCG, SERUM, QUALITATIVE: Preg, Serum: NEGATIVE

## 2014-01-21 LAB — SURGICAL PCR SCREEN
MRSA, PCR: NEGATIVE
STAPHYLOCOCCUS AUREUS: NEGATIVE

## 2014-01-21 NOTE — Pre-Procedure Instructions (Signed)
Shelly Gilbert  01/21/2014   Your procedure is scheduled on:  April 14 at 1100  Report to [facility]  2 * 3 at 0800AM.  Call this number if you have problems the morning of surgery: [phone]   Remember:   Do not eat food or drink liquids after midnight.   Take these medicines the morning of surgery with A SIP OF WATER:  NA  Stop taking Aspirin, Aleve, Ibuprofen, BC's, Goody's, Herbal Medications, Fish Oil   Do not wear jewelry, make-up or nail polish.  Do not wear lotions, powders, or perfumes. You may wear deodorant.  Do not shave 48 hours prior to surgery. Men may shave face and neck.  Do not bring valuables to the hospital.  [facility] is not responsible  for any belongings or valuables.               Contacts, dentures or bridgework may not be worn into surgery.  Leave suitcase in the car. After surgery it may be brought to your room.  For patients admitted to the hospital, discharge time is determined by your treatment team.               Patients discharged the day of surgery will not be allowed to drive home.    Special Instructions: [facility] - Preparing for Surgery  Before surgery, you can play an important role.  Because skin is not sterile, your skin needs to be as free of germs as possible.  You can reduce the number of germs on you skin by washing with CHG (chlorahexidine gluconate) soap before surgery.  CHG is an antiseptic cleaner which kills germs and bonds with the skin to continue killing germs even after washing.  Please DO NOT use if you have an allergy to CHG or antibacterial soaps.  If your skin becomes reddened/irritated stop using the CHG and inform your nurse when you arrive at Short Stay.  Do not shave (including legs and underarms) for at least 48 hours prior to the first CHG shower.  You may shave your face.  Please follow these instructions carefully:   1.  Shower with CHG Soap the night before surgery and the morning of  Surgery.  2.  If you choose to wash your hair, wash your hair first as usual with your normal shampoo.  3.  After you shampoo, rinse your hair and body thoroughly to remove the  Shampoo.  4.  Use CHG as you would any other liquid soap.  You can apply chg directly  to the skin and wash gently with scrungie or a clean washcloth.  5.  Apply the CHG Soap to your body ONLY FROM THE NECK DOWN.   Do not use on open wounds or open sores.  Avoid contact with your eyes,       ears, mouth and genitals (private parts).  Wash genitals (private parts) with your normal soap.  6.  Wash thoroughly, paying special attention to the area where your surgery will be performed.  7.  Thoroughly rinse your body with warm water from the neck down.  8.  DO NOT shower/wash with your normal soap after using and rinsing off the CHG Soap.  9.  Pat yourself dry with a clean towel.            10.  Wear clean pajamas.            11.  Place clean sheets on  your bed the night of your first shower and do not sleep with pets.  Day of Surgery  Do not apply any lotions/deoderants the morning of surgery.  Please wear clean clothes to the hospital/surgery center.     Please read over the following fact sheets that you were given: Pain Booklet, Coughing and Deep Breathing, Blood Transfusion Information, MRSA Information and Surgical Site Infection Prevention

## 2014-01-31 MED ORDER — CEFAZOLIN SODIUM-DEXTROSE 2-3 GM-% IV SOLR
2.0000 g | INTRAVENOUS | Status: AC
  Administered 2014-02-01: 2 g via INTRAVENOUS
  Filled 2014-01-31: qty 50

## 2014-02-01 ENCOUNTER — Inpatient Hospital Stay (HOSPITAL_COMMUNITY)
Admission: RE | Admit: 2014-02-01 | Discharge: 2014-02-03 | DRG: 460 | Disposition: A | Discharge status: Home / Self Care | Payer: BC Managed Care – PPO | Source: Ambulatory Visit | Attending: Neurosurgery | Admitting: Neurosurgery

## 2014-02-01 ENCOUNTER — Encounter (HOSPITAL_COMMUNITY): Payer: BC Managed Care – PPO | Admitting: Certified Registered"

## 2014-02-01 ENCOUNTER — Ambulatory Visit (HOSPITAL_COMMUNITY): Payer: BC Managed Care – PPO | Admitting: Certified Registered"

## 2014-02-01 ENCOUNTER — Encounter (HOSPITAL_COMMUNITY): Payer: Self-pay | Admitting: *Deleted

## 2014-02-01 ENCOUNTER — Ambulatory Visit (HOSPITAL_COMMUNITY): Payer: BC Managed Care – PPO

## 2014-02-01 ENCOUNTER — Encounter (HOSPITAL_COMMUNITY)
Admission: RE | Disposition: A | Discharge status: Home / Self Care | Payer: Self-pay | Source: Ambulatory Visit | Attending: Neurosurgery

## 2014-02-01 DIAGNOSIS — Z833 Family history of diabetes mellitus: Secondary | ICD-10-CM

## 2014-02-01 DIAGNOSIS — Z8249 Family history of ischemic heart disease and other diseases of the circulatory system: Secondary | ICD-10-CM

## 2014-02-01 DIAGNOSIS — Q762 Congenital spondylolisthesis: Principal | ICD-10-CM

## 2014-02-01 DIAGNOSIS — M4307 Spondylolysis, lumbosacral region: Secondary | ICD-10-CM | POA: Diagnosis present

## 2014-02-01 HISTORY — PX: MAXIMUM ACCESS (MAS)POSTERIOR LUMBAR INTERBODY FUSION (PLIF) 1 LEVEL: SHX6368

## 2014-02-01 LAB — TYPE AND SCREEN
ABO/RH(D): O POS
ANTIBODY SCREEN: NEGATIVE

## 2014-02-01 LAB — ABO/RH: ABO/RH(D): O POS

## 2014-02-01 SURGERY — FOR MAXIMUM ACCESS (MAS) POSTERIOR LUMBAR INTERBODY FUSION (PLIF) 1 LEVEL
Anesthesia: General | Site: Spine Lumbar

## 2014-02-01 MED ORDER — PROMETHAZINE HCL 25 MG/ML IJ SOLN: 6.2500 mg | INTRAMUSCULAR | Status: DC | PRN

## 2014-02-01 MED ORDER — BUPIVACAINE HCL (PF) 0.5 % IJ SOLN
INTRAMUSCULAR | Status: DC | PRN
  Administered 2014-02-01: 4 mL

## 2014-02-01 MED ORDER — DOCUSATE SODIUM 100 MG PO CAPS
100.0000 mg | ORAL_CAPSULE | Freq: Two times a day (BID) | ORAL | Status: DC
  Administered 2014-02-01 – 2014-02-03 (×4): 100 mg via ORAL
  Filled 2014-02-01 (×5): qty 1

## 2014-02-01 MED ORDER — FENTANYL CITRATE 0.05 MG/ML IJ SOLN
INTRAMUSCULAR | Status: AC
  Filled 2014-02-01: qty 5

## 2014-02-01 MED ORDER — PROPOFOL INFUSION 10 MG/ML OPTIME: INTRAVENOUS | Status: DC | PRN

## 2014-02-01 MED ORDER — PANTOPRAZOLE SODIUM 40 MG PO TBEC
40.0000 mg | DELAYED_RELEASE_TABLET | Freq: Every day | ORAL | Status: DC
  Administered 2014-02-01 – 2014-02-02 (×2): 40 mg via ORAL
  Filled 2014-02-01 (×2): qty 1

## 2014-02-01 MED ORDER — HYDROMORPHONE HCL PF 1 MG/ML IJ SOLN
INTRAMUSCULAR | Status: AC
  Filled 2014-02-01: qty 1

## 2014-02-01 MED ORDER — PANTOPRAZOLE SODIUM 40 MG IV SOLR: 40.0000 mg | Freq: Every day | INTRAVENOUS | Status: DC

## 2014-02-01 MED ORDER — ACETAMINOPHEN 325 MG PO TABS
650.0000 mg | ORAL_TABLET | ORAL | Status: DC | PRN
  Administered 2014-02-02: 650 mg via ORAL
  Filled 2014-02-01: qty 2

## 2014-02-01 MED ORDER — THROMBIN 20000 UNITS EX SOLR
CUTANEOUS | Status: DC | PRN
  Administered 2014-02-01: 12:00:00 via TOPICAL

## 2014-02-01 MED ORDER — HYDROMORPHONE HCL 2 MG PO TABS
2.0000 mg | ORAL_TABLET | ORAL | Status: DC | PRN
  Administered 2014-02-02 – 2014-02-03 (×6): 2 mg via ORAL
  Filled 2014-02-01 (×6): qty 1

## 2014-02-01 MED ORDER — 0.9 % SODIUM CHLORIDE (POUR BTL) OPTIME
TOPICAL | Status: DC | PRN
  Administered 2014-02-01: 1000 mL

## 2014-02-01 MED ORDER — SUCCINYLCHOLINE CHLORIDE 20 MG/ML IJ SOLN
INTRAMUSCULAR | Status: DC | PRN
  Administered 2014-02-01: 90 mg via INTRAVENOUS

## 2014-02-01 MED ORDER — OXYCODONE-ACETAMINOPHEN 5-325 MG PO TABS: 1.0000 | ORAL_TABLET | ORAL | Status: DC | PRN

## 2014-02-01 MED ORDER — FERROUS SULFATE 325 (65 FE) MG PO TABS
325.0000 mg | ORAL_TABLET | Freq: Every day | ORAL | Status: DC
  Filled 2014-02-01 (×3): qty 1

## 2014-02-01 MED ORDER — OXYCODONE HCL 5 MG/5ML PO SOLN: 5.0000 mg | Freq: Once | ORAL | Status: DC | PRN

## 2014-02-01 MED ORDER — SODIUM CHLORIDE 0.9 % IJ SOLN
3.0000 mL | Freq: Two times a day (BID) | INTRAMUSCULAR | Status: DC
  Administered 2014-02-01 – 2014-02-02 (×3): 3 mL via INTRAVENOUS

## 2014-02-01 MED ORDER — HYDROMORPHONE HCL PF 1 MG/ML IJ SOLN
0.2500 mg | INTRAMUSCULAR | Status: DC | PRN
  Administered 2014-02-01 (×4): 0.5 mg via INTRAVENOUS

## 2014-02-01 MED ORDER — MIDAZOLAM HCL 2 MG/2ML IJ SOLN
INTRAMUSCULAR | Status: AC
  Filled 2014-02-01: qty 2

## 2014-02-01 MED ORDER — ONDANSETRON HCL 4 MG/2ML IJ SOLN
4.0000 mg | INTRAMUSCULAR | Status: DC | PRN
  Administered 2014-02-02: 4 mg via INTRAVENOUS
  Filled 2014-02-01: qty 2

## 2014-02-01 MED ORDER — HYDROCODONE-ACETAMINOPHEN 5-325 MG PO TABS
1.0000 | ORAL_TABLET | ORAL | Status: DC | PRN
  Administered 2014-02-01: 1 via ORAL
  Filled 2014-02-01: qty 1

## 2014-02-01 MED ORDER — ACETAMINOPHEN 650 MG RE SUPP: 650.0000 mg | RECTAL | Status: DC | PRN

## 2014-02-01 MED ORDER — PROPOFOL 10 MG/ML IV BOLUS
INTRAVENOUS | Status: AC
  Filled 2014-02-01: qty 20

## 2014-02-01 MED ORDER — VITAMIN D (ERGOCALCIFEROL) 1.25 MG (50000 UNIT) PO CAPS
50000.0000 [IU] | ORAL_CAPSULE | ORAL | Status: DC
Start: 2014-02-07 — End: 2014-02-03

## 2014-02-01 MED ORDER — SUPER B-COMPLEX/IRON/VITAMIN C PO TABS: 1.0000 | ORAL_TABLET | Freq: Every day | ORAL | Status: DC

## 2014-02-01 MED ORDER — ONDANSETRON HCL 4 MG/2ML IJ SOLN
INTRAMUSCULAR | Status: DC | PRN
  Administered 2014-02-01: 4 mg via INTRAVENOUS

## 2014-02-01 MED ORDER — PROPOFOL INFUSION 10 MG/ML OPTIME
INTRAVENOUS | Status: DC | PRN
  Administered 2014-02-01: 50 ug/kg/min via INTRAVENOUS

## 2014-02-01 MED ORDER — PHENYLEPHRINE HCL 10 MG/ML IJ SOLN
INTRAMUSCULAR | Status: DC | PRN
  Administered 2014-02-01 (×2): 80 ug via INTRAVENOUS

## 2014-02-01 MED ORDER — SENNA 8.6 MG PO TABS
1.0000 | ORAL_TABLET | Freq: Two times a day (BID) | ORAL | Status: DC
  Administered 2014-02-01 – 2014-02-03 (×4): 8.6 mg via ORAL
  Filled 2014-02-01 (×5): qty 1

## 2014-02-01 MED ORDER — ARTIFICIAL TEARS OP OINT
TOPICAL_OINTMENT | OPHTHALMIC | Status: DC | PRN
  Administered 2014-02-01: 1 via OPHTHALMIC

## 2014-02-01 MED ORDER — SODIUM CHLORIDE 0.9 % IJ SOLN: 3.0000 mL | INTRAMUSCULAR | Status: DC | PRN

## 2014-02-01 MED ORDER — LIDOCAINE HCL (CARDIAC) 20 MG/ML IV SOLN
INTRAVENOUS | Status: DC | PRN
  Administered 2014-02-01: 70 mg via INTRAVENOUS

## 2014-02-01 MED ORDER — DIAZEPAM 5 MG/ML IJ SOLN
5.0000 mg | Freq: Once | INTRAMUSCULAR | Status: AC
  Administered 2014-02-01: 5 mg via INTRAVENOUS

## 2014-02-01 MED ORDER — MORPHINE SULFATE 2 MG/ML IJ SOLN
1.0000 mg | INTRAMUSCULAR | Status: DC | PRN
  Administered 2014-02-01 – 2014-02-02 (×2): 2 mg via INTRAVENOUS
  Filled 2014-02-01 (×2): qty 1

## 2014-02-01 MED ORDER — EPHEDRINE SULFATE 50 MG/ML IJ SOLN
INTRAMUSCULAR | Status: DC | PRN
  Administered 2014-02-01: 10 mg via INTRAVENOUS

## 2014-02-01 MED ORDER — FENTANYL CITRATE 0.05 MG/ML IJ SOLN
INTRAMUSCULAR | Status: DC | PRN
  Administered 2014-02-01: 100 ug via INTRAVENOUS
  Administered 2014-02-01 (×5): 50 ug via INTRAVENOUS

## 2014-02-01 MED ORDER — DIAZEPAM 5 MG/ML IJ SOLN
INTRAMUSCULAR | Status: AC
  Filled 2014-02-01: qty 2

## 2014-02-01 MED ORDER — DIAZEPAM 5 MG PO TABS
5.0000 mg | ORAL_TABLET | Freq: Four times a day (QID) | ORAL | Status: DC | PRN
  Administered 2014-02-01: 5 mg via ORAL
  Filled 2014-02-01: qty 1

## 2014-02-01 MED ORDER — AZELASTINE HCL 0.1 % NA SOLN
2.0000 | Freq: Two times a day (BID) | NASAL | Status: DC
  Administered 2014-02-02: 2 via NASAL
  Filled 2014-02-01: qty 30

## 2014-02-01 MED ORDER — MENTHOL 3 MG MT LOZG: 1.0000 | LOZENGE | OROMUCOSAL | Status: DC | PRN

## 2014-02-01 MED ORDER — CEFAZOLIN SODIUM 1-5 GM-% IV SOLN
1.0000 g | Freq: Three times a day (TID) | INTRAVENOUS | Status: AC
  Administered 2014-02-01 – 2014-02-02 (×2): 1 g via INTRAVENOUS
  Filled 2014-02-01 (×2): qty 50

## 2014-02-01 MED ORDER — PROPOFOL 10 MG/ML IV BOLUS
INTRAVENOUS | Status: DC | PRN
  Administered 2014-02-01: 30 mg via INTRAVENOUS
  Administered 2014-02-01: 140 mg via INTRAVENOUS

## 2014-02-01 MED ORDER — SENNOSIDES-DOCUSATE SODIUM 8.6-50 MG PO TABS
1.0000 | ORAL_TABLET | Freq: Every evening | ORAL | Status: DC | PRN
  Filled 2014-02-01: qty 1

## 2014-02-01 MED ORDER — B COMPLEX-C PO TABS
1.0000 | ORAL_TABLET | Freq: Every day | ORAL | Status: DC
  Filled 2014-02-01 (×3): qty 1

## 2014-02-01 MED ORDER — BUPIVACAINE LIPOSOME 1.3 % IJ SUSP
20.0000 mL | Freq: Once | INTRAMUSCULAR | Status: AC
  Administered 2014-02-01: 20 mL
  Filled 2014-02-01: qty 20

## 2014-02-01 MED ORDER — OXYCODONE HCL 5 MG PO TABS: 5.0000 mg | ORAL_TABLET | Freq: Once | ORAL | Status: DC | PRN

## 2014-02-01 MED ORDER — MIDAZOLAM HCL 5 MG/5ML IJ SOLN
INTRAMUSCULAR | Status: DC | PRN
  Administered 2014-02-01: 2 mg via INTRAVENOUS

## 2014-02-01 MED ORDER — PHENOL 1.4 % MT LIQD: 1.0000 | OROMUCOSAL | Status: DC | PRN

## 2014-02-01 MED ORDER — LACTATED RINGERS IV SOLN
INTRAVENOUS | Status: DC
  Administered 2014-02-01 (×3): via INTRAVENOUS

## 2014-02-01 MED ORDER — LIDOCAINE-EPINEPHRINE 1 %-1:100000 IJ SOLN
INTRAMUSCULAR | Status: DC | PRN
  Administered 2014-02-01: 4 mL

## 2014-02-01 MED ORDER — DIAZEPAM 5 MG PO TABS: 5.0000 mg | ORAL_TABLET | Freq: Four times a day (QID) | ORAL | Status: DC | PRN

## 2014-02-01 MED ORDER — ALUM & MAG HYDROXIDE-SIMETH 200-200-20 MG/5ML PO SUSP
30.0000 mL | Freq: Four times a day (QID) | ORAL | Status: DC | PRN
  Filled 2014-02-01: qty 30

## 2014-02-01 MED ORDER — BISACODYL 10 MG RE SUPP
10.0000 mg | Freq: Every day | RECTAL | Status: DC | PRN
  Administered 2014-02-03: 10 mg via RECTAL
  Filled 2014-02-01: qty 1

## 2014-02-01 MED ORDER — KCL IN DEXTROSE-NACL 20-5-0.45 MEQ/L-%-% IV SOLN
INTRAVENOUS | Status: DC
  Administered 2014-02-01: 19:00:00 via INTRAVENOUS
  Filled 2014-02-01 (×5): qty 1000

## 2014-02-01 MED ORDER — FLEET ENEMA 7-19 GM/118ML RE ENEM
1.0000 | ENEMA | Freq: Once | RECTAL | Status: AC | PRN
  Filled 2014-02-01: qty 1

## 2014-02-01 SURGICAL SUPPLY — 98 items
ADH SKN CLS APL DERMABOND .7 (GAUZE/BANDAGES/DRESSINGS)
ADH SKN CLS LQ APL DERMABOND (GAUZE/BANDAGES/DRESSINGS) ×1
APL SKNCLS STERI-STRIP NONHPOA (GAUZE/BANDAGES/DRESSINGS)
BAG DECANTER FOR FLEXI CONT (MISCELLANEOUS) ×1 IMPLANT
BENZOIN TINCTURE PRP APPL 2/3 (GAUZE/BANDAGES/DRESSINGS) ×1 IMPLANT
BLADE SURG ROTATE 9660 (MISCELLANEOUS) IMPLANT
BONE MATRIX OSTEOCEL PRO MED (Bone Implant) ×1 IMPLANT
BUR MATCHSTICK NEURO 3.0 LAGG (BURR) ×2 IMPLANT
BUR PRECISION FLUTE 5.0 (BURR) ×2 IMPLANT
CAGE COROENT MP 8X23 (Cage) ×2 IMPLANT
CANISTER SUCT 3000ML (MISCELLANEOUS) ×2 IMPLANT
CLIP NEUROVISION LG (CLIP) ×1 IMPLANT
CONT SPEC 4OZ CLIKSEAL STRL BL (MISCELLANEOUS) ×4 IMPLANT
COVER BACK TABLE 24X17X13 BIG (DRAPES) IMPLANT
COVER TABLE BACK 60X90 (DRAPES) ×2 IMPLANT
DERMABOND ADHESIVE PROPEN (GAUZE/BANDAGES/DRESSINGS) ×1
DERMABOND ADVANCED (GAUZE/BANDAGES/DRESSINGS)
DERMABOND ADVANCED .7 DNX12 (GAUZE/BANDAGES/DRESSINGS) ×1 IMPLANT
DERMABOND ADVANCED .7 DNX6 (GAUZE/BANDAGES/DRESSINGS) IMPLANT
DRAPE C-ARM 42X72 X-RAY (DRAPES) ×3 IMPLANT
DRAPE C-ARMOR (DRAPES) ×1 IMPLANT
DRAPE LAPAROTOMY 100X72X124 (DRAPES) ×2 IMPLANT
DRAPE POUCH INSTRU U-SHP 10X18 (DRAPES) ×2 IMPLANT
DRAPE SURG 17X23 STRL (DRAPES) ×2 IMPLANT
DRESSING TELFA 8X3 (GAUZE/BANDAGES/DRESSINGS) ×1 IMPLANT
DRSG OPSITE POSTOP 4X6 (GAUZE/BANDAGES/DRESSINGS) ×1 IMPLANT
DURAPREP 26ML APPLICATOR (WOUND CARE) ×2 IMPLANT
ELECT BLADE 4.0 EZ CLEAN MEGAD (MISCELLANEOUS) ×2
ELECT REM PT RETURN 9FT ADLT (ELECTROSURGICAL) ×2
ELECTRODE BLDE 4.0 EZ CLN MEGD (MISCELLANEOUS) IMPLANT
ELECTRODE REM PT RTRN 9FT ADLT (ELECTROSURGICAL) ×1 IMPLANT
EVACUATOR 1/8 PVC DRAIN (DRAIN) ×1 IMPLANT
GAUZE SPONGE 4X4 16PLY XRAY LF (GAUZE/BANDAGES/DRESSINGS) IMPLANT
GLOVE BIO SURGEON STRL SZ 6.5 (GLOVE) ×2 IMPLANT
GLOVE BIO SURGEON STRL SZ7 (GLOVE) ×1 IMPLANT
GLOVE BIO SURGEON STRL SZ8 (GLOVE) ×3 IMPLANT
GLOVE BIOGEL PI IND STRL 7.0 (GLOVE) IMPLANT
GLOVE BIOGEL PI IND STRL 7.5 (GLOVE) IMPLANT
GLOVE BIOGEL PI IND STRL 8 (GLOVE) ×2 IMPLANT
GLOVE BIOGEL PI IND STRL 8.5 (GLOVE) ×2 IMPLANT
GLOVE BIOGEL PI INDICATOR 7.0 (GLOVE) ×1
GLOVE BIOGEL PI INDICATOR 7.5 (GLOVE) ×1
GLOVE BIOGEL PI INDICATOR 8 (GLOVE) ×1
GLOVE BIOGEL PI INDICATOR 8.5 (GLOVE) ×1
GLOVE ECLIPSE 7.0 STRL STRAW (GLOVE) ×1 IMPLANT
GLOVE ECLIPSE 8.0 STRL XLNG CF (GLOVE) ×3 IMPLANT
GLOVE EXAM NITRILE LRG STRL (GLOVE) IMPLANT
GLOVE EXAM NITRILE MD LF STRL (GLOVE) IMPLANT
GLOVE EXAM NITRILE XL STR (GLOVE) IMPLANT
GLOVE EXAM NITRILE XS STR PU (GLOVE) IMPLANT
GOWN BRE IMP SLV AUR LG STRL (GOWN DISPOSABLE) ×2 IMPLANT
GOWN BRE IMP SLV AUR XL STRL (GOWN DISPOSABLE) ×3 IMPLANT
GOWN STRL REIN 2XL LVL4 (GOWN DISPOSABLE) ×4 IMPLANT
KIT BASIN OR (CUSTOM PROCEDURE TRAY) ×2 IMPLANT
KIT NDL NVM5 EMG ELECT (KITS) IMPLANT
KIT NEEDLE NVM5 EMG ELECT (KITS) ×1 IMPLANT
KIT NEEDLE NVM5 EMG ELECTRODE (KITS) ×1
KIT POSITION SURG JACKSON T1 (MISCELLANEOUS) ×2 IMPLANT
KIT ROOM TURNOVER OR (KITS) ×2 IMPLANT
MILL MEDIUM DISP (BLADE) ×2 IMPLANT
NDL HYPO 21X1.5 SAFETY (NEEDLE) IMPLANT
NDL HYPO 25X1 1.5 SAFETY (NEEDLE) ×1 IMPLANT
NDL SPNL 18GX3.5 QUINCKE PK (NEEDLE) IMPLANT
NEEDLE HYPO 21X1.5 SAFETY (NEEDLE) ×2 IMPLANT
NEEDLE HYPO 25X1 1.5 SAFETY (NEEDLE) ×2 IMPLANT
NEEDLE SPNL 18GX3.5 QUINCKE PK (NEEDLE) IMPLANT
NS IRRIG 1000ML POUR BTL (IV SOLUTION) ×2 IMPLANT
PACK LAMINECTOMY NEURO (CUSTOM PROCEDURE TRAY) ×2 IMPLANT
PAD ARMBOARD 7.5X6 YLW CONV (MISCELLANEOUS) ×6 IMPLANT
PATTIES SURGICAL .5 X.5 (GAUZE/BANDAGES/DRESSINGS) IMPLANT
PATTIES SURGICAL .5 X1 (DISPOSABLE) IMPLANT
PATTIES SURGICAL 1X1 (DISPOSABLE) IMPLANT
ROD 35MM (Rod) ×1 IMPLANT
ROD 5.5X40MM (Rod) ×1 IMPLANT
SCREW LOCK (Screw) ×8 IMPLANT
SCREW LOCK FXNS SPNE MAS PL (Screw) IMPLANT
SCREW SHANK 6.5X45 (Screw) ×1 IMPLANT
SCREW SHANK 6.5X65 (Screw) ×2 IMPLANT
SCREW SHANK PLIF MAS 5.5X40 (Screw) ×1 IMPLANT
SCREW TULIP 5.5 (Screw) ×3 IMPLANT
SPONGE GAUZE 4X4 12PLY (GAUZE/BANDAGES/DRESSINGS) ×1 IMPLANT
SPONGE LAP 4X18 X RAY DECT (DISPOSABLE) IMPLANT
SPONGE SURGIFOAM ABS GEL 100 (HEMOSTASIS) ×2 IMPLANT
STAPLER SKIN PROX WIDE 3.9 (STAPLE) IMPLANT
STRIP CLOSURE SKIN 1/2X4 (GAUZE/BANDAGES/DRESSINGS) ×1 IMPLANT
SUT VIC AB 1 CT1 18XBRD ANBCTR (SUTURE) ×2 IMPLANT
SUT VIC AB 1 CT1 8-18 (SUTURE) ×2
SUT VIC AB 2-0 CT1 18 (SUTURE) ×3 IMPLANT
SUT VIC AB 3-0 SH 8-18 (SUTURE) ×3 IMPLANT
SYR 20CC LL (SYRINGE) ×1 IMPLANT
SYR 20ML ECCENTRIC (SYRINGE) ×2 IMPLANT
SYR 3ML LL SCALE MARK (SYRINGE) ×2 IMPLANT
SYR 5ML LL (SYRINGE) IMPLANT
TOWEL OR 17X24 6PK STRL BLUE (TOWEL DISPOSABLE) ×2 IMPLANT
TOWEL OR 17X26 10 PK STRL BLUE (TOWEL DISPOSABLE) ×2 IMPLANT
TRAP SPECIMEN MUCOUS 40CC (MISCELLANEOUS) ×2 IMPLANT
TRAY FOLEY CATH 14FRSI W/METER (CATHETERS) ×2 IMPLANT
WATER STERILE IRR 1000ML POUR (IV SOLUTION) ×2 IMPLANT

## 2014-02-01 NOTE — Anesthesia Preprocedure Evaluation (Addendum)
Anesthesia Evaluation  Patient identified by MRN, date of birth, ID band Patient awake    Reviewed: Allergy & Precautions, H&P , NPO status , Patient's Chart, lab work & pertinent test results  Airway Mallampati: II TM Distance: >3 FB Neck ROM: Full    Dental  (+) Teeth Intact, Dental Advisory Given   Pulmonary          Cardiovascular Rhythm:Regular Rate:Normal     Neuro/Psych  Neuromuscular disease    GI/Hepatic hiatal hernia,   Endo/Other    Renal/GU      Musculoskeletal   Abdominal   Peds  Hematology   Anesthesia Other Findings Numbness in B/L feet when sitting. Bond on front tooth but intact.  Reproductive/Obstetrics                           Anesthesia Physical Anesthesia Plan  ASA: I  Anesthesia Plan: General   Post-op Pain Management:    Induction: Intravenous  Airway Management Planned: Oral ETT  Additional Equipment: None  Intra-op Plan:   Post-operative Plan: Extubation in OR  Informed Consent: I have reviewed the patients History and Physical, chart, labs and discussed the procedure including the risks, benefits and alternatives for the proposed anesthesia with the patient or authorized representative who has indicated his/her understanding and acceptance.   Dental advisory given  Plan Discussed with: CRNA, Anesthesiologist and Surgeon  Anesthesia Plan Comments:        Anesthesia Quick Evaluation
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
## 2014-02-01 NOTE — Transfer of Care (Signed)
Immediate Anesthesia Transfer of Care Note  Patient: Shelly Gilbert  Procedure(s) Performed: Procedure(s): LUMBAR FIVE-SACFAL ONE POSTERIOR LUMBAR INTERBODY FUSION,GIL PROCEDURE (N/A)  Patient Location: PACU  Anesthesia Type:General  Level of Consciousness: lethargic and responds to stimulation  Airway & Oxygen Therapy: Patient Spontanous Breathing and Patient connected to nasal cannula oxygen  Post-op Assessment: Report given to PACU RN and Post -op Vital signs reviewed and stable  Post vital signs: Reviewed and stable  Complications: No apparent anesthesia complications

## 2014-02-01 NOTE — Plan of Care (Signed)
Problem: Consults Goal: Diagnosis - Spinal Surgery Outcome: Completed/Met Date Met:  02/01/14 Thoraco/Lumbar Spine Fusion     

## 2014-02-01 NOTE — Progress Notes (Signed)
Awake, alert, conversant.  MAEW with good strength.  Doing well. 

## 2014-02-01 NOTE — Anesthesia Procedure Notes (Signed)
Procedure Name: Intubation Date/Time: 02/01/2014 11:02 AM Performed by: Storm Frisk E Pre-anesthesia Checklist: Patient identified, Timeout performed, Emergency Drugs available, Suction available and Patient being monitored Patient Re-evaluated:Patient Re-evaluated prior to inductionOxygen Delivery Method: Circle system utilized Preoxygenation: Pre-oxygenation with 100% oxygen Intubation Type: IV induction and Cricoid Pressure applied Ventilation: Mask ventilation without difficulty Laryngoscope Size: Mac and 3 Grade View: Grade I Tube type: Oral Tube size: 7.0 mm Number of attempts: 1 Airway Equipment and Method: Stylet Placement Confirmation: ETT inserted through vocal cords under direct vision,  breath sounds checked- equal and bilateral and positive ETCO2 Secured at: 22 cm Tube secured with: Tape Dental Injury: Teeth and Oropharynx as per pre-operative assessment

## 2014-02-01 NOTE — Interval H&P Note (Signed)
History and Physical Interval Note:  02/01/2014 7:14 AM  Shelly Gilbert  has presented today for surgery, with the diagnosis of Congenital Lumbar spondylosis and spondylolisthesis, Radiculopathy, Lumbago  The various methods of treatment have been discussed with the patient and family. After consideration of risks, benefits and other options for treatment, the patient has consented to  Procedure(s) with comments: FOR MAXIMUM ACCESS (MAS) POSTERIOR LUMBAR INTERBODY FUSION (PLIF) 1 LEVEL (N/A) - L5 Gill procedure with L5-S1 For maximum access posterior lumbar interbody fusion as a surgical intervention .  The patient's history has been reviewed, patient examined, no change in status, stable for surgery.  I have reviewed the patient's chart and labs.  Questions were answered to the patient's satisfaction.     Erline Levine

## 2014-02-01 NOTE — Anesthesia Postprocedure Evaluation (Signed)
  Anesthesia Post-op Note  Patient: Shelly Gilbert  Procedure(s) Performed: Procedure(s): LUMBAR FIVE-SACFAL ONE POSTERIOR LUMBAR INTERBODY FUSION,GIL PROCEDURE (N/A)  Patient Location: PACU  Anesthesia Type:General  Level of Consciousness: awake and alert   Airway and Oxygen Therapy: Patient Spontanous Breathing  Post-op Pain: mild  Post-op Assessment: Post-op Vital signs reviewed  Post-op Vital Signs: stable  Last Vitals:  Filed Vitals:   02/01/14 1552  BP:   Pulse: 100  Temp: 36.7 C  Resp: 13    Complications: No apparent anesthesia complications

## 2014-02-01 NOTE — Brief Op Note (Signed)
02/01/2014  2:28 PM  PATIENT:  Shelly Gilbert  53 y.o. female  PRE-OPERATIVE DIAGNOSIS:  Congenital Lumbar spondylosis and spondylolisthesis, Radiculopathy, Lumbago L 5 S 1 level  POST-OPERATIVE DIAGNOSIS:  Congenital Lumbar spondylosis and spondylolisthesis, Radiculopathy, Lumbago L 5 S 1 level  PROCEDURE:  Procedure(s): LUMBAR FIVE-SACFAL ONE POSTERIOR LUMBAR INTERBODY FUSION,GIL PROCEDURE (N/A) with PEEK cages, autograft, allograft, pedicle screws L 5 - S 1 levels with posterolateral arthrodesis with autograft/allograft   Decompression in excess of that which would typically be performed with standard PLIF procedure.  SURGEON:  Surgeon(s) and Role:    * Erline Levine, MD - Primary    * Consuella Lose, MD - Assisting  PHYSICIAN ASSISTANT:   ASSISTANTS: [place], RN   ANESTHESIA:   general  EBL:  Total I/O In: 1500 [I.V.:1500] Out: 254 [Urine:255; Blood:400]  BLOOD ADMINISTERED:none  DRAINS: none   LOCAL MEDICATIONS USED:  MARCAINE     SPECIMEN:  No Specimen  DISPOSITION OF SPECIMEN:  N/A  COUNTS:  YES  TOURNIQUET:  * No tourniquets in log *  DICTATION: Patient is 53 year old woman with Grade II mobile spondylolisthesis of L5 on S1 with lumbar stenosis and bilateral L 5 pars defects. She has severe bilateral L5 radiculopathies. It was elected to take her to surgery for L 5 Gill decompression and fusion at this level.  Procedure: Patient was placed in a prone position on the [place] table after smooth and uncomplicated induction of general endotracheal anesthesia. Her low back was prepped and draped in usual sterile fashion with betadine scrub and DuraPrep. Area of incision was infiltrated with local lidocaine. Incision was made to the lumbodorsal fascia was incised and exposure was performed of the L5 through S1 spinous processes laminae facet joint and transverse processes. Intraoperative x-ray was obtained which confirmed correct orientation. A Gill procedure with  total laminectomy of L5 was performed with disarticulation of the facet joints at this level and thorough decompression was performed of both L5 and S1 nerve roots along with the common dural tube. This decompression was more involved than would be typical of that performed for PLIF alone and included painstaking dissection of adherent ligament compressing the thecal sac and wide decompression of all neural elements. A thorough discectomy was initially performed on the left with preparation of the endplates for grafting a trial spacer was placed this level and a thorough discectomy was performed on the right as well after using chisel to remove superior aspect of S 1 vertebra to gain access to the interspace. This was difficult due to severity of slip and high pelvic incidence.  Bone autograft was packed within the interspace bilaterally along with Osteocel. Bilateral 23 x 4 degree x  8 mm peek cages were packed with autograft and extender and was inserted the interspace and countersunk appropriately along with 6 cc of morselized bone autograft. The posterolateral region was extensively decorticated and pedicle probes were placed at L5 and S1 bilaterally. Intraoperative fluoroscopy confirmed correct orientationin the AP and lateral plane. 45 x 6.5 mm pedicle screws were placed at L5 on the right  and 40 x 5.5 mm screw was placed on the left at L 5 and 6.5 x35 mm screws were placed at S 1 bilaterally. Final x-rays demonstrated well-positioned interbody grafts and pedicle screw fixation. A 35 mm lordotic rod was placed on the right and a 40 mm rod was placed on the left locked down in situ and the posterolateral region was packed with the remaining  bone  graft extender bilaterally. The wound was irrigated. Fascia was closed with 1 Vicryl sutures skin edges were reapproximated 2 and 3-0 Vicryl sutures. The wound is dressed with Dermabond and an occlusive dressing.  The patient was extubated in the operating room and  taken to recovery in stable satisfactory condition. She tolerated the operation well counts were correct at the end of the case.   PLAN OF CARE: Admit to inpatient   PATIENT DISPOSITION:  PACU - hemodynamically stable.   Delay start of Pharmacological VTE agent (>24hrs) due to surgical blood loss or risk of bleeding: yes

## 2014-02-01 NOTE — Progress Notes (Signed)
Patient ID: Shelly Gilbert, female   DOB: 04/11/1961, 53 y.o.   MRN: [id number] Alert,conversant.  Good strength BLE. Ambulated with nurse. Incision with Honeycomb & dermabond. No erythema, swelling, or drainage.  Mild bilat hip /buttock pain - controlled with po meds.  Verdis Prime, RN, BSN

## 2014-02-01 NOTE — Op Note (Signed)
02/01/2014  2:28 PM  PATIENT:  Shelly Gilbert  53 y.o. female  PRE-OPERATIVE DIAGNOSIS:  Congenital Lumbar spondylosis and spondylolisthesis, Radiculopathy, Lumbago L 5 S 1 level  POST-OPERATIVE DIAGNOSIS:  Congenital Lumbar spondylosis and spondylolisthesis, Radiculopathy, Lumbago L 5 S 1 level  PROCEDURE:  Procedure(s): LUMBAR FIVE-SACFAL ONE POSTERIOR LUMBAR INTERBODY FUSION,GIL PROCEDURE (N/A) with PEEK cages, autograft, allograft, pedicle screws L 5 - S 1 levels with posterolateral arthrodesis with autograft/allograft   Decompression in excess of that which would typically be performed with standard PLIF procedure.  SURGEON:  Surgeon(s) and Role:    * Erline Levine, MD - Primary    * Consuella Lose, MD - Assisting  PHYSICIAN ASSISTANT:   ASSISTANTS: Poteat, RN   ANESTHESIA:   general  EBL:  Total I/O In: 1500 [I.V.:1500] Out: 161 [Urine:255; Blood:400]  BLOOD ADMINISTERED:none  DRAINS: none   LOCAL MEDICATIONS USED:  MARCAINE     SPECIMEN:  No Specimen  DISPOSITION OF SPECIMEN:  N/A  COUNTS:  YES  TOURNIQUET:  * No tourniquets in log *  DICTATION: Patient is 53 year old woman with Grade II mobile spondylolisthesis of L5 on S1 with lumbar stenosis and bilateral L 5 pars defects. She has severe bilateral L5 radiculopathies. It was elected to take her to surgery for L 5 Gill decompression and fusion at this level.  Procedure: Patient was placed in a prone position on the Kirkland table after smooth and uncomplicated induction of general endotracheal anesthesia. Her low back was prepped and draped in usual sterile fashion with betadine scrub and DuraPrep. Area of incision was infiltrated with local lidocaine. Incision was made to the lumbodorsal fascia was incised and exposure was performed of the L5 through S1 spinous processes laminae facet joint and transverse processes. Intraoperative x-ray was obtained which confirmed correct orientation. A Gill procedure with  total laminectomy of L5 was performed with disarticulation of the facet joints at this level and thorough decompression was performed of both L5 and S1 nerve roots along with the common dural tube. This decompression was more involved than would be typical of that performed for PLIF alone and included painstaking dissection of adherent ligament compressing the thecal sac and wide decompression of all neural elements. A thorough discectomy was initially performed on the left with preparation of the endplates for grafting a trial spacer was placed this level and a thorough discectomy was performed on the right as well after using chisel to remove superior aspect of S 1 vertebra to gain access to the interspace. This was difficult due to severity of slip and high pelvic incidence.  Bone autograft was packed within the interspace bilaterally along with Osteocel. Bilateral 23 x 4 degree x  8 mm peek cages were packed with autograft and extender and was inserted the interspace and countersunk appropriately along with 6 cc of morselized bone autograft. The posterolateral region was extensively decorticated and pedicle probes were placed at L5 and S1 bilaterally. Intraoperative fluoroscopy confirmed correct orientationin the AP and lateral plane. 45 x 6.5 mm pedicle screws were placed at L5 on the right  and 40 x 5.5 mm screw was placed on the left at L 5 and 6.5 x35 mm screws were placed at S 1 bilaterally. Final x-rays demonstrated well-positioned interbody grafts and pedicle screw fixation. A 35 mm lordotic rod was placed on the right and a 40 mm rod was placed on the left locked down in situ and the posterolateral region was packed with the remaining  bone  graft extender bilaterally. The wound was irrigated. Fascia was closed with 1 Vicryl sutures skin edges were reapproximated 2 and 3-0 Vicryl sutures. The wound is dressed with Dermabond and an occlusive dressing.  The patient was extubated in the operating room and  taken to recovery in stable satisfactory condition. She tolerated the operation well counts were correct at the end of the case.   PLAN OF CARE: Admit to inpatient   PATIENT DISPOSITION:  PACU - hemodynamically stable.   Delay start of Pharmacological VTE agent (>24hrs) due to surgical blood loss or risk of bleeding: yes

## 2014-02-02 ENCOUNTER — Encounter (HOSPITAL_COMMUNITY): Payer: Self-pay | Admitting: Neurosurgery

## 2014-02-02 MED ORDER — TIZANIDINE HCL 2 MG PO TABS
2.0000 mg | ORAL_TABLET | Freq: Four times a day (QID) | ORAL | Status: DC | PRN
  Administered 2014-02-02 – 2014-02-03 (×5): 2 mg via ORAL
  Filled 2014-02-02 (×4): qty 1

## 2014-02-02 NOTE — Progress Notes (Signed)
Patient ID: Shelly Gilbert, female   DOB: [birth year], 53 y.o.   MRN: [id number]  Alert, eating lunch, family present. Pt reports improved pain control with Dilaudid & Tizanidine, with less side effects. Some lumbar pain persists - reassured EL:[id number] course of healing. Ambulated with PT.  Expect d/c to home in AM when husband available for transportation.  Verdis Prime RN, BSN

## 2014-02-02 NOTE — Evaluation (Addendum)
Physical Therapy Evaluation Patient Details Name: Shelly Gilbert MRN: [id number] DOB: 1961-02-06 Today's Date: 02/02/2014   History of Present Illness  53 y.o. female admitted to [facility] on 02/01/14 for elective L5-S1 PLIF.  No significant PMHx and no previous h/o back surgery.    Clinical Impression  Pt is POD #1 s/p lumbar surgery.  She is moving slowly, and guarded, but well with not much assistance.  She feels "fuzzy" from the pain medicine and would like for PT to reinforce education and stair training with her husband present tomorrow.   PT to follow acutely for deficits listed below.       Follow Up Recommendations No PT follow up    Equipment Recommendations  None recommended by PT    Recommendations for Other Services   None    Precautions / Restrictions Precautions Precautions: Back Precaution Booklet Issued: Yes (comment) Precaution Comments: Back handout given and back precautions reviewed.   Required Braces or Orthoses: Spinal Brace Spinal Brace: Applied in sitting position;Lumbar corset Restrictions Weight Bearing Restrictions: No      Mobility  Bed Mobility Overal bed mobility: Needs Assistance Bed Mobility: Rolling;Sidelying to Sit;Sit to Sidelying Rolling: Supervision Sidelying to sit: Supervision     Sit to sidelying: Min assist General bed mobility comments: Supervision for safety and verbal cues to reinforce log roll.  Min assist needed to get one leg back into the bed when transitioning to sidelying.    Transfers Overall transfer level: Needs assistance Equipment used: Rolling walker (2 wheeled) Transfers: Sit to/from Stand Sit to Stand: Min guard Stand pivot transfers: Supervision       General transfer comment: Min guard assist for safety during transitions as pt is moving slowly.    Ambulation/Gait Ambulation/Gait assistance: Min guard Ambulation Distance (Feet): 90 Feet Assistive device: None Gait Pattern/deviations: Step-through  pattern Gait velocity: decreased Gait velocity interpretation: Below normal speed for age/gender General Gait Details: pt moving slowly and guarded.  No signs of LE buckling.    Stairs Stairs: Yes Stairs assistance: Min assist Stair Management: One rail Left (hand held assist left) Number of Stairs: 5 General stair comments: Pt using step to pattern up and down for safety.  Pt assessing if she feels better going up/down with right or left leg leading.  Pt would like reinforcement with husband here tomorrow, especially how to guard/assist her safely as she has no rails to enter her home.           Balance Overall balance assessment: Needs assistance Sitting-balance support: Feet supported Sitting balance-Leahy Scale: Good     Standing balance support: No upper extremity supported Standing balance-Leahy Scale: Fair                               Pertinent Vitals/Pain See vitals flow sheet.     Home Living Family/patient expects to be discharged to:: Private residence Living Arrangements: Spouse/significant other Available Help at Discharge: Family;Available 24 hours/day (husband 24/7 x 1 week) Type of Home: House Home Access: Stairs to enter Entrance Stairs-Rails: None (10 steps in the back with bil rails) Entrance Stairs-Number of Steps: 5 Home Layout: Able to live on main level with bedroom/bathroom Home Equipment: Cane - quad;Bedside commode;Walker - 2 wheels      Prior Function Level of Independence: Independent         Comments: pt still working out and riding horses PTA     Hand Dominance  Dominant Hand: Right    Extremity/Trunk Assessment   Upper Extremity Assessment: Defer to OT evaluation           Lower Extremity Assessment: Generalized weakness      Cervical / Trunk Assessment: Normal  Communication   Communication: No difficulties  Cognition Arousal/Alertness: Awake/alert Behavior During Therapy: WFL for tasks  assessed/performed Overall Cognitive Status: Within Functional Limits for tasks assessed                      General Comments General comments (skin integrity, edema, etc.): Educated pt on walking as main form of exercise, and walking "purposefully" 3 times per day.  No sitting for longer than 30-45 mins at a time wihtout getting up and moving around.            Assessment/Plan    PT Assessment Patient needs continued PT services  PT Diagnosis Difficulty walking;Abnormality of gait;Generalized weakness;Acute pain   PT Problem List Decreased strength;Decreased activity tolerance;Decreased balance;Decreased mobility;Decreased knowledge of use of DME;Decreased knowledge of precautions;Pain  PT Treatment Interventions DME instruction;Gait training;Stair training;Functional mobility training;Therapeutic activities;Therapeutic exercise;Balance training;Cognitive remediation;Neuromuscular re-education;Patient/family education;Modalities   PT Goals (Current goals can be found in the Care Plan section) Acute Rehab PT Goals Patient Stated Goal: she wants the pain to go away PT Goal Formulation: With patient Time For Goal Achievement: 02/09/14 Potential to Achieve Goals: Good    Frequency Min 5X/week    End of Session Equipment Utilized During Treatment: Back brace Activity Tolerance: Patient limited by fatigue;Patient limited by pain Patient left: in bed;with call bell/phone within reach Nurse Communication: Mobility status         Time: 0254-2706 PT Time Calculation (min): 28 min   Charges:   PT Evaluation $Initial PT Evaluation Tier I: 1 Procedure PT Treatments $Gait Training: 8-22 mins        Djeneba Barsch B. [place], [address], DPT [phone]   02/02/2014, 12:28 PM

## 2014-02-02 NOTE — Evaluation (Signed)
Occupational Therapy Evaluation Patient Details Name: Shelly Gilbert MRN: [id number] DOB: 1961/08/25 Today's Date: 02/02/2014    History of Present Illness Congenital Lumbar spondylosis and spondylolisthesis, Radiculopathy, Lumbago L 5 S 1 level   Clinical Impression   Pt admitted for above diagnosis. Pt min guard to supervision with basic ADLs; able to state 2/3 precautions; reviewed back precautions. Pt unable to don/doff socks this date due to pain. Educated and demonstrated use of AE and techniques for LB dressing. Discussed tips for safe completion of basic ADLs with back precautions. 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 No further acute OT needs at this time.    Follow Up Recommendations  No OT follow up    Equipment Recommendations  None recommended by OT    Recommendations for Other Services       Precautions / Restrictions Precautions Precautions: Back Precaution Booklet Issued: Yes (comment) Precaution Comments: pt able to state 2/3 precautions. reviewed precautions with pt. Required Braces or Orthoses: Spinal Brace Spinal Brace: Applied in sitting position;Lumbar corset Restrictions Weight Bearing Restrictions: No      Mobility Bed Mobility Overal bed mobility: Needs Assistance Bed Mobility: Rolling;Sidelying to Sit;Sit to Sidelying Rolling: Supervision Sidelying to sit: Supervision     Sit to sidelying: Supervision General bed mobility comments: good technique. extra time   Transfers Overall transfer level: Needs assistance Equipment used: Rolling walker (2 wheeled) Transfers: Sit to/from Stand Sit to Stand: Supervision Stand pivot transfers: Supervision       General transfer comment: good technique. cues for observing back precautions during turns with rw    Balance Overall balance assessment: Needs assistance Sitting-balance support: Feet supported Sitting balance-Leahy Scale:  Fair     Standing balance support: Bilateral upper extremity supported Standing balance-Leahy Scale: Fair                              ADL Overall ADL's : Needs assistance/impaired Eating/Feeding: Set up;Sitting   Grooming: Set up;Standing   Upper Body Bathing: Min guard;With adaptive equipment;Sitting   Lower Body Bathing: Min guard;Sit to/from stand;With adaptive equipment   Upper Body Dressing : Set up;Sitting   Lower Body Dressing: Min guard;Sit to/from stand;Cueing for back precautions   Toilet Transfer: Supervision/safety   Toileting- Clothing Manipulation and Hygiene: Supervision/safety;Cueing for back precautions;Sit to/from stand   Tub/ Shower Transfer: Tub transfer;Min guard;3 in 1;Ambulation   Functional mobility during ADLs: Min guard;Rolling walker General ADL Comments: Pt unable to access feet to don/doff socks this date. Educated on AE and techniques for safe completion of basic ADLs with back precautions. Educated on walker and home safety. Pt has 3n1 at home and knows shower transfer technique.      Vision                     Perception     Praxis      Pertinent Vitals/Pain 6-7/10 at surgical site. Increased activity during session     Hand Dominance Right   Extremity/Trunk Assessment Upper Extremity Assessment Upper Extremity Assessment: [facility] for tasks assessed    Lower Extremity Assessment: defer to PT Lower Extremity Assessment:    Cervical / Trunk Assessment Cervical / Trunk Assessment:    Communication Communication Communication: No difficulties   Cognition Arousal/Alertness: Awake/alert Behavior During Therapy: WFL for tasks assessed/performed Overall Cognitive Status: Within Functional Limits for tasks assessed  General Comments       Exercises       Shoulder Instructions      Home Living Family/patient expects to be discharged to:: Private residence Living Arrangements:  Spouse/significant other Available Help at Discharge: Family Type of Home: House Home Access: Stairs to enter Technical brewer of Steps: 5 Entrance Stairs-Rails: None (pt plans to have spouse and son assist) Home Layout: Able to live on main level with bedroom/bathroom     Bathroom Shower/Tub: Teacher, early years/pre: Standard Bathroom Accessibility: Yes How Accessible: Accessible via walker Home Equipment: Cane - quad;Bedside commode;Walker - 2 wheels          Prior Functioning/Environment Level of Independence: Independent        Comments: pt still working out and riding horses PTA    OT Diagnosis:     OT Problem List:     OT Treatment/Interventions:      OT Goals(Current goals can be found in the care plan section) Acute Rehab OT Goals Patient Stated Goal: not stated  OT Frequency:     Barriers to D/C:            Co-evaluation              End of Session Equipment Utilized During Treatment: Gait belt;Rolling walker;Back brace Nurse Communication: Mobility status  Activity Tolerance: Patient limited by pain;Patient tolerated treatment well Patient left: in bed;with call bell/phone within reach   Time: 0923-0950 OT Time Calculation (min): 27 min Charges:  OT General Charges $OT Visit: 1 Procedure OT Evaluation $Initial OT Evaluation Tier I: 1 Procedure OT Treatments $Self Care/Home Management : 8-22 mins G-Codes:    Shelly Gilbert 2014/02/17, 12:14 PM

## 2014-02-02 NOTE — Progress Notes (Signed)
Subjective: Patient reports "I didn't sleep well. I hurt but I don't like the way the medicine makes me feel"  Objective: Vital signs in last 24 hours: Temp:  [97.6 F (36.4 C)-99.7 F (37.6 C)] 99.1 F (37.3 C) (04/15 0319) Pulse Rate:  [85-111] 87 (04/15 0319) Resp:  [10-20] 16 (04/15 0319) BP: (91-147)/(55-95) 91/55 mmHg (04/15 0319) SpO2:  [97 %-100 %] 99 % (04/15 0319) Weight:  [57.607 kg (127 lb)] 57.607 kg (127 lb) (04/14 0808)  Intake/Output from previous day: 04/14 0701 - 04/15 0700 In: 2220 [P.O.:720; I.V.:1500] Out: 730 [Urine:330; Blood:400] Intake/Output this shift:    Alert, reporting lumbar & bilat buttock pain. Good strength BLE. Incision with honeycomb drsg & Dermabond. Site without erythema, swelling, or drainage. Pt tearful, anxious about causing injury with turning in bed. Ambulated in hallway last night without difficulty.     Lab Results: No results found for this basename: WBC, HGB, HCT, PLT,  in the last 72 hours BMET No results found for this basename: NA, K, CL, CO2, GLUCOSE, BUN, CREATININE, CALCIUM,  in the last 72 hours  Studies/Results: Dg Lumbar Spine 2-3 Views  02/01/2014   CLINICAL DATA:  L5-S1 posterior lumbar interbody fusion  EXAM: DG C-ARM 61-120 MIN; LUMBAR SPINE - 2-3 VIEW  TECHNIQUE: Intraoperative radiographs obtained and submitted for radiologic review.  FLUOROSCOPY TIME:  Please see operative report.  COMPARISON:  Lumbar spine MRI 12/26/2013  FINDINGS: Frontal and lateral radiographs of the lumbar spine and demonstrate interval placement of bilateral pedicle screws at L5 and S1 and an interbody graft within the interspace. Posterior rods has not yet been placed. There is persistent grade 1 anterolisthesis of L5 on S1.  IMPRESSION: In progress L5-S1 posterior lumbar interbody fusion as above.   Electronically Signed   By: Jacqulynn Cadet M.D.   On: 02/01/2014 14:38   Dg C-arm 61-120 Min  02/01/2014   CLINICAL DATA:  L5-S1 posterior  lumbar interbody fusion  EXAM: DG C-ARM 61-120 MIN; LUMBAR SPINE - 2-3 VIEW  TECHNIQUE: Intraoperative radiographs obtained and submitted for radiologic review.  FLUOROSCOPY TIME:  Please see operative report.  COMPARISON:  Lumbar spine MRI 12/26/2013  FINDINGS: Frontal and lateral radiographs of the lumbar spine and demonstrate interval placement of bilateral pedicle screws at L5 and S1 and an interbody graft within the interspace. Posterior rods has not yet been placed. There is persistent grade 1 anterolisthesis of L5 on S1.  IMPRESSION: In progress L5-S1 posterior lumbar interbody fusion as above.   Electronically Signed   By: Jacqulynn Cadet M.D.   On: 02/01/2014 14:38    Assessment/Plan: Improving   LOS: 1 day  Discussed with pt need for effective medication and gradually increased activity.  Per Dr. Vertell Limber, will continue Dilaudid & change Valium to Tizanidine for spasms. Pt agrees to plan.   Aaron Edelman Emmah Bratcher 02/02/2014, 7:45 AM

## 2014-02-03 MED ORDER — MAGNESIUM CITRATE PO SOLN
1.0000 | Freq: Once | ORAL | Status: DC
  Filled 2014-02-03: qty 296

## 2014-02-03 NOTE — Progress Notes (Signed)
Subjective: Patient reports "I feel better with that new medicine. I can tell when its wearing off. My muscles get tighter"  Objective: Vital signs in last 24 hours: Temp:  [99 F (37.2 C)-100.1 F (37.8 C)] 99.1 F (37.3 C) (04/16 0750) Pulse Rate:  [78-99] 90 (04/16 1036) Resp:  [18-20] 18 (04/16 0750) BP: (85-104)/(45-68) 96/54 mmHg (04/16 1036) SpO2:  [96 %-98 %] 97 % (04/16 0750)  Intake/Output from previous day: 04/15 0701 - 04/16 0700 In: 600 [P.O.:600] Out: -  Intake/Output this shift:    Alert, conversant, reporting improved pain control. Good strength BLE. Incision without erythema, swelling, or drainage beneath Dermabond & honeycomb drsg. Lumbar and right buttock pain controlled by Dilaudid. No leg pain. BP's this AM upper 80's, low 95'G - now 96 systolic. She has been asymptomatic all morning, including walk in hallway. Her only report of discomfort is "bloated feeling". No BM yet. BS active x4. Belly nontender, somewhat firm. She reports hx constipation when off pain meds.   Lab Results: No results found for this basename: WBC, HGB, HCT, PLT,  in the last 72 hours BMET No results found for this basename: NA, K, CL, CO2, GLUCOSE, BUN, CREATININE, CALCIUM,  in the last 72 hours  Studies/Results: Dg Lumbar Spine 2-3 Views  02/01/2014   CLINICAL DATA:  L5-S1 posterior lumbar interbody fusion  EXAM: DG C-ARM 61-120 MIN; LUMBAR SPINE - 2-3 VIEW  TECHNIQUE: Intraoperative radiographs obtained and submitted for radiologic review.  FLUOROSCOPY TIME:  Please see operative report.  COMPARISON:  Lumbar spine MRI 12/26/2013  FINDINGS: Frontal and lateral radiographs of the lumbar spine and demonstrate interval placement of bilateral pedicle screws at L5 and S1 and an interbody graft within the interspace. Posterior rods has not yet been placed. There is persistent grade 1 anterolisthesis of L5 on S1.  IMPRESSION: In progress L5-S1 posterior lumbar interbody fusion as above.    Electronically Signed   By: Jacqulynn Cadet M.D.   On: 02/01/2014 14:38   Dg C-arm 61-120 Min  02/01/2014   CLINICAL DATA:  L5-S1 posterior lumbar interbody fusion  EXAM: DG C-ARM 61-120 MIN; LUMBAR SPINE - 2-3 VIEW  TECHNIQUE: Intraoperative radiographs obtained and submitted for radiologic review.  FLUOROSCOPY TIME:  Please see operative report.  COMPARISON:  Lumbar spine MRI 12/26/2013  FINDINGS: Frontal and lateral radiographs of the lumbar spine and demonstrate interval placement of bilateral pedicle screws at L5 and S1 and an interbody graft within the interspace. Posterior rods has not yet been placed. There is persistent grade 1 anterolisthesis of L5 on S1.  IMPRESSION: In progress L5-S1 posterior lumbar interbody fusion as above.   Electronically Signed   By: Jacqulynn Cadet M.D.   On: 02/01/2014 14:38    Assessment/Plan: Improving   LOS: 2 days  Will work on bowels this AM, monitoring BP as well. Pt will request suppository. Ok per DrStern to d/c to home after BM if she remains asymptomatic and BP staying in or above 38'V systolic.  Pt agrees with plan & verbalizes understanding of d/c instructions.  She will call office to schedule 3-4 week f/u appt in office.    Aaron Edelman Brittanny Levenhagen 02/03/2014, 11:15 AM

## 2014-02-03 NOTE — Discharge Summary (Signed)
Physician Discharge Summary  Patient ID: Shelly Gilbert MRN: [id number] DOB/AGE: [birth year] 52 y.o.  Admit date: 02/01/2014 Discharge date: 02/03/2014  Admission Diagnoses: Congenital Lumbar spondylosis and spondylolisthesis, Radiculopathy, Lumbago L 5 S 1 level   Discharge Diagnoses: Congenital Lumbar spondylosis and spondylolisthesis, Radiculopathy, Lumbago L 5 S 1 level s/p LUMBAR FIVE-SACFAL ONE POSTERIOR LUMBAR INTERBODY FUSION,GIL PROCEDURE (N/A) with PEEK cages, autograft, allograft, pedicle screws L 5 - S 1 levels with posterolateral arthrodesis with autograft/allograft  Active Problems:   Spondylolysis of lumbosacral region   Discharged Condition: good  Hospital Course: Shelly Gilbert was admitted for surgery with Dx spondylolisthesis and radiculopathy. Following uncomplicated PLIF, she recovered nicely and transferred to 3500 for nursing care and therapies. She has progressed well.  Consults: None  Significant Diagnostic Studies: radiology: X-Ray: intra-operative  Treatments: surgery: LUMBAR FIVE-SACFAL ONE POSTERIOR LUMBAR INTERBODY FUSION,GIL PROCEDURE (N/A) with PEEK cages, autograft, allograft, pedicle screws L 5 - S 1 levels with posterolateral arthrodesis with autograft/allograft   Discharge Exam: Blood pressure 96/54, pulse 90, temperature 99.1 F (37.3 C), temperature source Oral, resp. rate 18, weight 57.607 kg (127 lb), SpO2 97.00%.  Alert, conversant, reporting improved pain control. Good strength BLE. Incision without erythema, swelling, or drainage beneath Dermabond & honeycomb drsg. Lumbar and right buttock pain controlled by Dilaudid. No leg pain. BP's this AM upper 80's, low 35'W - now 96 systolic. She has been asymptomatic all morning, including walk in hallway. Her only report of discomfort is "bloated feeling". No BM yet. BS active x4. Belly nontender, somewhat firm. She reports hx constipation when off pain meds. Will administer meds separately today monitoring  BP, while working on her bowels.    Disposition: Discharge to home after bowels moving if systolic pressures staying in 90's or above & pt remains asymptomatic.   Pt agrees with plan & verbalizes understanding of d/c instructions.  She will call office to schedule 3-4 week f/u appt in office. Rx's Dilaudid and Tizanidine to chart.      Medication List    ASK your doctor about these medications       azelastine 137 MCG/SPRAY nasal spray  Commonly known as:  ASTELIN  Place 2 sprays into both nostrils 2 (two) times daily. Use in each nostril as directed     Fiber Chew  Chew 1 tablet by mouth daily as needed.     KRILL OIL PO  Take 1 capsule by mouth daily.     MULTIVITAMIN PO  Take 1 tablet by mouth daily.     SUPER B-COMPLEX/IRON/VITAMIN C Tabs  Take 1 tablet by mouth daily.     VITAMIN D (ERGOCALCIFEROL) PO  Take 1,000 Units by mouth daily.         SignedVerdis Prime 02/03/2014, 11:26 AM

## 2014-02-03 NOTE — Progress Notes (Signed)
Physical Therapy Treatment Patient Details Name: Shelly Gilbert MRN: [id number] DOB: Dec 25, 1960 Today's Date: 02/03/2014    History of Present Illness 53 y.o. female admitted to [facility] on 02/01/14 for elective L5-S1 PLIF.  No significant PMHx and no previous h/o back surgery.      PT Comments    Pt is POD #2 now and continues to move well.  She is concerned about her lower BPs (surgical RN and floor RN made aware), however, she is asymptomatic with me while walking and doing the stairs and reports that the pain medicine she is on is doing a decent job to help with her pain.  Education reinforced with her husband present and they showed proficiency at helping each other on the stairs to enter the home.    Follow Up Recommendations  No PT follow up     Equipment Recommendations  None recommended by PT    Recommendations for Other Services   None     Precautions / Restrictions Precautions Precautions: Back Precaution Comments: pt able to report 3/3 back precautions.  Required Braces or Orthoses: Spinal Brace Spinal Brace: Applied in sitting position;Lumbar corset Restrictions Weight Bearing Restrictions: No    Mobility  Bed Mobility Overal bed mobility: Needs Assistance Bed Mobility: Rolling;Sidelying to Sit;Sit to Sidelying Rolling: Min guard Sidelying to sit: Supervision     Sit to sidelying: Supervision General bed mobility comments: Min guard assist to help her initiate roll from supine due to PT took away her railing and put bed flat.    Transfers Overall transfer level: Needs assistance Equipment used: None Transfers: Sit to/from Stand Sit to Stand: Supervision         General transfer comment: supervision for safety  Ambulation/Gait Ambulation/Gait assistance: Supervision Ambulation Distance (Feet): 100 Feet Assistive device: None Gait Pattern/deviations: Step-through pattern Gait velocity: decreased Gait velocity interpretation: Below normal speed for  age/gender     Stairs Stairs: Yes Stairs assistance: Min assist Stair Management: Forwards;Step to pattern (min hand held assist provided by husband) Number of Stairs: 5 General stair comments: Educated pt/husband on safest guarding and sequencing techniqe of bil legs.  Husband providing assistance.           Balance Overall balance assessment: Needs assistance Sitting-balance support: Feet supported Sitting balance-Leahy Scale: Good     Standing balance support: Single extremity supported;No upper extremity supported Standing balance-Leahy Scale: Fair                      Cognition Arousal/Alertness: Awake/alert Behavior During Therapy: WFL for tasks assessed/performed Overall Cognitive Status: Within Functional Limits for tasks assessed                         General Comments General comments (skin integrity, edema, etc.): Reviewed back precautions, mobility education from yesterday with both pt and now husband.  Encouraged pt to sit on the side of the bed and stand at the side of the bed for a minute before walking as a precaution due to her lower BP.        Pertinent Vitals/Pain  02/03/14 1017  Vital Signs  Pulse Rate 96  BP 103/65 mmHg  BP Location Left arm  BP Method Automatic  Patient Position, if appropriate Lying (before walking with PT)    02/03/14 1018  Vital Signs  Pulse Rate 99  BP ! 87/56 mmHg  BP Location Left arm  BP Method Automatic  Patient Position, if  appropriate Standing (after walking with PT)              PT Goals (current goals can now be found in the care plan section) Acute Rehab PT Goals Patient Stated Goal: wants right hip pain to go away, states left hip is better now.  Progress towards PT goals: Progressing toward goals    Frequency  Min 5X/week    PT Plan Current plan remains appropriate       End of Session Equipment Utilized During Treatment: Back brace Activity Tolerance: Patient limited by  pain Patient left: in bed;with call bell/phone within reach;with family/visitor present     Time: 8115-7262 PT Time Calculation (min): 35 min  Charges:  $Gait Training: 8-22 mins $Therapeutic Activity: 8-22 mins                      Tawania Daponte B. Aneisa Karren, PT, DPT [phone]   02/03/2014, 1:16 PM

## 2014-02-03 NOTE — Progress Notes (Signed)
Pt doing well. Pt's BP is stable. Pt and husband given D/C instructions with Rx's, verbal understanding was given. Pt D/C'd home via wheelchair @ 1645 per MD order. Pt's IV was removed prior to D/C. Pt is stable @ D/C and has no other needs. Holli Humbles, RN

## 2014-04-04 ENCOUNTER — Telehealth: Payer: Self-pay | Admitting: Gastroenterology

## 2014-04-04 NOTE — Telephone Encounter (Signed)
Patient reports that her primary care has been treating her for hemorrhoids after back surgery.  Proctosol is helping, but she is asking for additional recommendations.  She feels recovery process is very slow.  Soak in tub of warm water/sitz bath 10 minutes BID, Tucs pads, meticulous cleaning between bowel movements.  She is also advised to get Recticare OTC and apply as needed.  She is using a stool softener.  She will call back for any additional questions or concerns

## 2014-09-30 ENCOUNTER — Other Ambulatory Visit: Payer: Self-pay | Admitting: Obstetrics and Gynecology

## 2014-09-30 DIAGNOSIS — N6001 Solitary cyst of right breast: Secondary | ICD-10-CM

## 2014-10-31 ENCOUNTER — Ambulatory Visit
Admission: RE | Admit: 2014-10-31 | Discharge: 2014-10-31 | Disposition: A | Discharge status: Home / Self Care | Payer: BLUE CROSS/BLUE SHIELD | Source: Ambulatory Visit | Attending: Obstetrics and Gynecology | Admitting: Obstetrics and Gynecology

## 2014-10-31 ENCOUNTER — Encounter (INDEPENDENT_AMBULATORY_CARE_PROVIDER_SITE_OTHER): Payer: Self-pay

## 2014-10-31 DIAGNOSIS — N6001 Solitary cyst of right breast: Secondary | ICD-10-CM

## 2015-11-02 ENCOUNTER — Other Ambulatory Visit: Payer: Self-pay

## 2015-11-02 DIAGNOSIS — Z1231 Encounter for screening mammogram for malignant neoplasm of breast: Secondary | ICD-10-CM

## 2015-11-23 ENCOUNTER — Ambulatory Visit
Admission: RE | Admit: 2015-11-23 | Discharge: 2015-11-23 | Disposition: A | Discharge status: Home / Self Care | Payer: BLUE CROSS/BLUE SHIELD | Source: Ambulatory Visit

## 2015-11-23 DIAGNOSIS — Z1231 Encounter for screening mammogram for malignant neoplasm of breast: Secondary | ICD-10-CM

## 2016-02-19 ENCOUNTER — Ambulatory Visit: Payer: BLUE CROSS/BLUE SHIELD | Admitting: Gastroenterology

## 2016-02-26 ENCOUNTER — Encounter: Payer: Self-pay | Admitting: Gastroenterology

## 2016-02-26 ENCOUNTER — Ambulatory Visit (INDEPENDENT_AMBULATORY_CARE_PROVIDER_SITE_OTHER): Payer: BLUE CROSS/BLUE SHIELD | Admitting: Gastroenterology

## 2016-02-26 VITALS — BP 138/86 | HR 80 | Ht 62.25 in | Wt 127.4 lb

## 2016-02-26 DIAGNOSIS — G8929 Other chronic pain: Secondary | ICD-10-CM | POA: Diagnosis not present

## 2016-02-26 DIAGNOSIS — Z86010 Personal history of colonic polyps: Secondary | ICD-10-CM

## 2016-02-26 DIAGNOSIS — R1011 Right upper quadrant pain: Secondary | ICD-10-CM | POA: Diagnosis not present

## 2016-02-26 DIAGNOSIS — R109 Unspecified abdominal pain: Principal | ICD-10-CM

## 2016-02-26 MED ORDER — NA SULFATE-K SULFATE-MG SULF 17.5-3.13-1.6 GM/177ML PO SOLN: 1.0000 | Freq: Once | ORAL | Status: DC

## 2016-02-26 MED ORDER — GLYCOPYRROLATE 1 MG PO TABS
1.0000 mg | ORAL_TABLET | Freq: Two times a day (BID) | ORAL | Status: DC | PRN
Start: 2016-02-26 — End: 2017-12-26

## 2016-02-26 NOTE — Patient Instructions (Signed)
We have sent the following medications to your pharmacy for you to pick up at your convenience:glycopyrrolate.   You have been scheduled for a colonoscopy. Please follow written instructions given to you at your visit today.  Please pick up your prep supplies at the pharmacy within the next 1-3 days. If you use inhalers (even only as needed), please bring them with you on the day of your procedure. Your physician has requested that you go to www.startemmi.com and enter the access code given to you at your visit today. This web site gives a general overview about your procedure. However, you should still follow specific instructions given to you by our office regarding your preparation for the procedure.   Thank you for choosing me and [facility].  Pricilla Riffle. Dagoberto Ligas., MD., Marval Regal

## 2016-02-26 NOTE — Progress Notes (Signed)
    History of Present Illness: This is a 55 year old female referred by Shirline Frees, MD for the evaluation of right flank/right abdominal pain and a personal history of adenomatous colon polyps. Prior colonoscopy in July 2012 showed internal hemorrhoids and one adenomatous colon polyp. She has had chronic problems with right sided abdominal pain/right flank pain and right back pain which has been previously evaluated and felt to be a combination of musculoskeletal and gastrointestinal. She states after undergoing back surgery in 2015 she had no problems at all with these symptoms until a few months ago. She notes that peanuts, high fat foods and caffeine exacerbate the symptoms. She has some variation between mild constipation and mild diarrhea but this pattern has not changed. She has chronic problems with allergies and sinus drainage and vocal changes. She was treated by ENT for possible GERD with LPR long course of PPI therapy which did not improve her symptoms. Denies weight loss, change in stool caliber, melena, hematochezia, nausea, vomiting, dysphagia, reflux symptoms, chest pain.  Review of Systems: Pertinent positive and negative review of systems were noted in the above HPI section. All other review of systems were otherwise negative.  Current Medications, Allergies, Past Medical History, Past Surgical History, Family History and Social History were reviewed in [employer] record.  Physical Exam: General: Well developed, well nourished, no acute distress Head: Normocephalic and atraumatic Eyes:  sclerae anicteric, EOMI Ears: Normal auditory acuity Mouth: No deformity or lesions Neck: Supple, no masses or thyromegaly Lungs: Clear throughout to auscultation Heart: Regular rate and rhythm; no murmurs, rubs or bruits Abdomen: Soft, non tender and non distended. No masses, hepatosplenomegaly or hernias noted. Normal Bowel sounds Rectal: Deferred to  colonoscopy Musculoskeletal: Symmetrical with no gross deformities  Skin: No lesions on visible extremities Pulses:  Normal pulses noted Extremities: No clubbing, cyanosis, edema or deformities noted Neurological: Alert oriented x 4, grossly nonfocal Cervical Nodes:  No significant cervical adenopathy Inguinal Nodes: No significant inguinal adenopathy Psychological:  Alert and cooperative. Normal mood and affect  Assessment and Recommendations:  1. Chronic right flank, right abdomen pain likely a combination of musculoskeletal and gastrointestinal symptoms. Avoid peanuts, high fat foods and caffeine. Trial of glycopyrrolate 1 mg twice daily. Consider trial of a low FODMAP diet.   2. Personal history of adenomatous colon polyps. She is due for surveillance colonoscopy. The risks (including bleeding, perforation, infection, missed lesions, medication reactions and possible hospitalization or surgery if complications occur), benefits, and alternatives to colonoscopy with possible biopsy and possible polypectomy were discussed with the patient and they consent to proceed.    cc: Shirline Frees, MD

## 2016-04-05 ENCOUNTER — Encounter: Payer: Self-pay | Admitting: Gastroenterology

## 2016-04-12 ENCOUNTER — Encounter: Payer: BLUE CROSS/BLUE SHIELD | Admitting: Gastroenterology

## 2016-05-17 DIAGNOSIS — Z23 Encounter for immunization: Secondary | ICD-10-CM | POA: Diagnosis not present

## 2016-05-17 DIAGNOSIS — E785 Hyperlipidemia, unspecified: Secondary | ICD-10-CM | POA: Diagnosis not present

## 2016-05-17 DIAGNOSIS — Z0000 Encounter for general adult medical examination without abnormal findings: Secondary | ICD-10-CM | POA: Diagnosis not present

## 2016-05-24 ENCOUNTER — Encounter: Payer: Self-pay | Admitting: Gastroenterology

## 2016-05-24 ENCOUNTER — Ambulatory Visit (AMBULATORY_SURGERY_CENTER): Payer: BLUE CROSS/BLUE SHIELD | Admitting: Gastroenterology

## 2016-05-24 VITALS — BP 117/71 | HR 89 | Temp 98.4°F | Resp 16 | Ht 62.0 in | Wt 127.0 lb

## 2016-05-24 DIAGNOSIS — Z86010 Personal history of colonic polyps: Secondary | ICD-10-CM

## 2016-05-24 MED ORDER — SODIUM CHLORIDE 0.9 % IV SOLN: 500.0000 mL | INTRAVENOUS | Status: DC

## 2016-05-24 NOTE — Patient Instructions (Signed)
Handout given: Hemmorroids   YOU HAD AN ENDOSCOPIC PROCEDURE TODAY AT [facility]:   Refer to the procedure report that was given to you for any specific questions about what was found during the examination.  If the procedure report does not answer your questions, please call your gastroenterologist to clarify.  If you requested that your care partner not be given the details of your procedure findings, then the procedure report has been included in a sealed envelope for you to review at your convenience later.  YOU SHOULD EXPECT: Some feelings of bloating in the abdomen. Passage of more gas than usual.  Walking can help get rid of the air that was put into your GI tract during the procedure and reduce the bloating. If you had a lower endoscopy (such as a colonoscopy or flexible sigmoidoscopy) you may notice spotting of blood in your stool or on the toilet paper. If you underwent a bowel prep for your procedure, you may not have a normal bowel movement for a few days.  Please Note:  You might notice some irritation and congestion in your nose or some drainage.  This is from the oxygen used during your procedure.  There is no need for concern and it should clear up in a day or so.  SYMPTOMS TO REPORT IMMEDIATELY:   Following lower endoscopy (colonoscopy or flexible sigmoidoscopy):  Excessive amounts of blood in the stool  Significant tenderness or worsening of abdominal pains  Swelling of the abdomen that is new, acute  Fever of 100F or higher  For urgent or emergent issues, a gastroenterologist can be reached at any hour by calling [phone].   DIET: Your first meal following the procedure should be a small meal and then it is ok to progress to your normal diet. Heavy or fried foods are harder to digest and may make you feel nauseous or bloated.  Likewise, meals heavy in dairy and vegetables can increase bloating.  Drink plenty of fluids but you should avoid alcoholic  beverages for 24 hours.  ACTIVITY:  You should plan to take it easy for the rest of today and you should NOT DRIVE or use heavy machinery until tomorrow (because of the sedation medicines used during the test).    FOLLOW UP: Our staff will call the number listed on your records the next business day following your procedure to check on you and address any questions or concerns that you may have regarding the information given to you following your procedure. If we do not reach you, we will leave a message.  However, if you are feeling well and you are not experiencing any problems, there is no need to return our call.  We will assume that you have returned to your regular daily activities without incident.  If any biopsies were taken you will be contacted by phone or by letter within the next 1-3 weeks.  Please call us at [phone] if you have not heard about the biopsies in 3 weeks.    SIGNATURES/CONFIDENTIALITY: You and/or your care partner have signed paperwork which will be entered into your electronic medical record.  These signatures attest to the fact that that the information above on your After Visit Summary has been reviewed and is understood.  Full responsibility of the confidentiality of this discharge information lies with you and/or your care-partner.

## 2016-05-24 NOTE — Progress Notes (Signed)
  [facility] Post-op Note  Patient: Shelly Gilbert  Procedure(s) Performed: colonoscopy  Patient Location: [facility] - Recovery Area  Anesthesia Type: Deep Sedation/Propofol  Level of Consciousness: awake, oriented and patient cooperative  Airway and Oxygen Therapy: Patient Spontanous Breathing  Post-op Pain: none  Post-op Assessment:  Post-op Vital signs reviewed, Patient's Cardiovascular Status Stable, Respiratory Function Stable, Patent Airway, No signs of Nausea or vomiting and Pain level controlled  Post-op Vital Signs: Reviewed and stable  Complications: No apparent anesthesia complications  Raylan Hanton E 3:55 PM

## 2016-05-24 NOTE — Op Note (Signed)
[place] Patient Name: Shelly Gilbert Procedure Date: 05/24/2016 3:28 PM MRN: [id number] Endoscopist: Ladene Artist , MD Age: 55 Referring MD:  Date of Birth: December 23, 1960 Gender: Female Account #: [id number] Procedure:                Colonoscopy Indications:              Surveillance: Personal history of adenomatous                            polyps on last colonoscopy > 5 years ago Medicines:                Monitored Anesthesia Care Procedure:                Pre-Anesthesia Assessment:                           - Prior to the procedure, a History and Physical                            was performed, and patient medications and                            allergies were reviewed. The patient's tolerance of                            previous anesthesia was also reviewed. The risks                            and benefits of the procedure and the sedation                            options and risks were discussed with the patient.                            All questions were answered, and informed consent                            was obtained. Prior Anticoagulants: The patient has                            taken no previous anticoagulant or antiplatelet                            agents. ASA Grade Assessment: II - A patient with                            mild systemic disease. After reviewing the risks                            and benefits, the patient was deemed in                            satisfactory condition to undergo the procedure.  After obtaining informed consent, the colonoscope                            was passed under direct vision. Throughout the                            procedure, the patient's blood pressure, pulse, and                            oxygen saturations were monitored continuously. The                            Model PCF-H190L [id number]) scope was introduced                            through the anus and  advanced to the the cecum,                            identified by appendiceal orifice and ileocecal                            valve. The ileocecal valve, appendiceal orifice,                            and rectum were photographed. The quality of the                            bowel preparation was excellent. The colonoscopy                            was performed without difficulty. The patient                            tolerated the procedure well. Scope In: 3:35:03 PM Scope Out: 3:48:28 PM Scope Withdrawal Time: 0 hours 9 minutes 55 seconds  Total Procedure Duration: 0 hours 13 minutes 25 seconds  Findings:                 Internal hemorrhoids were found during                            retroflexion. The hemorrhoids were medium-sized and                            Grade I (internal hemorrhoids that do not prolapse).                           The exam was otherwise without abnormality on                            direct and retroflexion views. Complications:            No immediate complications. Estimated blood loss:  None. Estimated Blood Loss:     Estimated blood loss: none. Impression:               - Internal hemorrhoids.                           - The examination was otherwise normal on direct                            and retroflexion views.                           - No specimens collected. Recommendation:           - Repeat colonoscopy in 5 years for surveillance.                           - Patient has a contact number available for                            emergencies. The signs and symptoms of potential                            delayed complications were discussed with the                            patient. Return to normal activities tomorrow.                            Written discharge instructions were provided to the                            patient.                           - Resume previous diet.                            - Continue present medications. Ladene Artist, MD 05/24/2016 3:51:42 PM This report has been signed electronically.

## 2016-05-27 ENCOUNTER — Telehealth: Payer: Self-pay | Admitting: *Deleted

## 2016-05-27 NOTE — Telephone Encounter (Signed)
  Follow up Call-  Call back number 05/24/2016  Post procedure Call Back phone  # [phone]  Permission to leave phone message Yes  Some recent data might be hidden     Patient questions:  Do you have a fever, pain , or abdominal swelling? No. Pain Score  0 *  Have you tolerated food without any problems? Yes.    Have you been able to return to your normal activities? Yes.    Do you have any questions about your discharge instructions: Diet   No. Medications  No. Follow up visit  No.  Do you have questions or concerns about your Care? No.  Actions: * If pain score is 4 or above: No action needed, pain <4.

## 2016-06-03 DIAGNOSIS — S70369A Insect bite (nonvenomous), unspecified thigh, initial encounter: Secondary | ICD-10-CM | POA: Diagnosis not present

## 2016-06-28 DIAGNOSIS — T07 Unspecified multiple injuries: Secondary | ICD-10-CM | POA: Diagnosis not present

## 2016-06-28 DIAGNOSIS — L821 Other seborrheic keratosis: Secondary | ICD-10-CM | POA: Diagnosis not present

## 2016-06-28 DIAGNOSIS — L820 Inflamed seborrheic keratosis: Secondary | ICD-10-CM | POA: Diagnosis not present

## 2016-06-28 DIAGNOSIS — D235 Other benign neoplasm of skin of trunk: Secondary | ICD-10-CM | POA: Diagnosis not present

## 2016-06-28 DIAGNOSIS — D1801 Hemangioma of skin and subcutaneous tissue: Secondary | ICD-10-CM | POA: Diagnosis not present

## 2016-07-25 DIAGNOSIS — H35719 Central serous chorioretinopathy, unspecified eye: Secondary | ICD-10-CM | POA: Diagnosis not present

## 2016-07-25 DIAGNOSIS — H31092 Other chorioretinal scars, left eye: Secondary | ICD-10-CM | POA: Diagnosis not present

## 2016-07-25 DIAGNOSIS — H5310 Unspecified subjective visual disturbances: Secondary | ICD-10-CM | POA: Diagnosis not present

## 2016-08-05 ENCOUNTER — Encounter (INDEPENDENT_AMBULATORY_CARE_PROVIDER_SITE_OTHER): Payer: Self-pay | Admitting: Ophthalmology

## 2016-08-16 ENCOUNTER — Encounter (INDEPENDENT_AMBULATORY_CARE_PROVIDER_SITE_OTHER): Payer: BLUE CROSS/BLUE SHIELD | Admitting: Ophthalmology

## 2016-08-16 DIAGNOSIS — H353132 Nonexudative age-related macular degeneration, bilateral, intermediate dry stage: Secondary | ICD-10-CM | POA: Diagnosis not present

## 2016-08-16 DIAGNOSIS — H35713 Central serous chorioretinopathy, bilateral: Secondary | ICD-10-CM | POA: Diagnosis not present

## 2016-08-16 DIAGNOSIS — H43813 Vitreous degeneration, bilateral: Secondary | ICD-10-CM | POA: Diagnosis not present

## 2016-08-16 DIAGNOSIS — H31002 Unspecified chorioretinal scars, left eye: Secondary | ICD-10-CM | POA: Diagnosis not present

## 2016-10-25 ENCOUNTER — Other Ambulatory Visit: Payer: Self-pay | Admitting: Obstetrics & Gynecology

## 2016-10-25 DIAGNOSIS — Z1231 Encounter for screening mammogram for malignant neoplasm of breast: Secondary | ICD-10-CM

## 2016-10-30 ENCOUNTER — Other Ambulatory Visit (HOSPITAL_COMMUNITY)
Admission: RE | Admit: 2016-10-30 | Discharge: 2016-10-30 | Disposition: A | Discharge status: Home / Self Care | Payer: BLUE CROSS/BLUE SHIELD | Source: Ambulatory Visit | Attending: Ophthalmology | Admitting: Ophthalmology

## 2016-10-30 DIAGNOSIS — H31002 Unspecified chorioretinal scars, left eye: Secondary | ICD-10-CM | POA: Diagnosis not present

## 2016-10-30 LAB — CBC
HCT: 35.3 % — ABNORMAL LOW (ref 36.0–46.0)
HEMOGLOBIN: 11.3 g/dL — AB (ref 12.0–15.0)
MCH: 26.8 pg (ref 26.0–34.0)
MCHC: 32 g/dL (ref 30.0–36.0)
MCV: 83.6 fL (ref 78.0–100.0)
Platelets: 268 10*3/uL (ref 150–400)
RBC: 4.22 MIL/uL (ref 3.87–5.11)
RDW: 13.8 % (ref 11.5–15.5)
WBC: 7 10*3/uL (ref 4.0–10.5)

## 2016-10-31 LAB — TOXOPLASMA ANTIBODIES- IGG AND  IGM

## 2016-11-15 ENCOUNTER — Encounter: Payer: BLUE CROSS/BLUE SHIELD | Admitting: Obstetrics & Gynecology

## 2016-11-29 ENCOUNTER — Ambulatory Visit
Admission: RE | Admit: 2016-11-29 | Discharge: 2016-11-29 | Disposition: A | Discharge status: Home / Self Care | Payer: BLUE CROSS/BLUE SHIELD | Source: Ambulatory Visit | Attending: Obstetrics & Gynecology | Admitting: Obstetrics & Gynecology

## 2016-11-29 DIAGNOSIS — Z1231 Encounter for screening mammogram for malignant neoplasm of breast: Secondary | ICD-10-CM | POA: Diagnosis not present

## 2016-12-18 DIAGNOSIS — M7711 Lateral epicondylitis, right elbow: Secondary | ICD-10-CM | POA: Diagnosis not present

## 2016-12-18 DIAGNOSIS — M7551 Bursitis of right shoulder: Secondary | ICD-10-CM | POA: Diagnosis not present

## 2016-12-18 DIAGNOSIS — M25511 Pain in right shoulder: Secondary | ICD-10-CM | POA: Diagnosis not present

## 2017-01-10 ENCOUNTER — Encounter: Payer: Self-pay | Admitting: Obstetrics & Gynecology

## 2017-01-10 ENCOUNTER — Other Ambulatory Visit (HOSPITAL_COMMUNITY)
Admission: RE | Admit: 2017-01-10 | Discharge: 2017-01-10 | Disposition: A | Discharge status: Home / Self Care | Payer: BLUE CROSS/BLUE SHIELD | Source: Ambulatory Visit | Attending: Obstetrics & Gynecology | Admitting: Obstetrics & Gynecology

## 2017-01-10 ENCOUNTER — Ambulatory Visit (INDEPENDENT_AMBULATORY_CARE_PROVIDER_SITE_OTHER): Payer: BLUE CROSS/BLUE SHIELD | Admitting: Obstetrics & Gynecology

## 2017-01-10 VITALS — BP 146/78 | HR 84 | Resp 14 | Ht 62.25 in | Wt 133.6 lb

## 2017-01-10 DIAGNOSIS — N6310 Unspecified lump in the right breast, unspecified quadrant: Secondary | ICD-10-CM

## 2017-01-10 DIAGNOSIS — R87610 Atypical squamous cells of undetermined significance on cytologic smear of cervix (ASC-US): Secondary | ICD-10-CM | POA: Diagnosis not present

## 2017-01-10 DIAGNOSIS — Z124 Encounter for screening for malignant neoplasm of cervix: Secondary | ICD-10-CM | POA: Diagnosis not present

## 2017-01-10 NOTE — Progress Notes (Signed)
Patient scheduled while in office. Spoke with Anderson Malta at [facility]. Patient scheduled for right breast diagnostic MMG and ultrasound if needed on 01/16/17 at 3:10pm. Patient is agreeable to date and time.

## 2017-01-10 NOTE — Progress Notes (Signed)
56 y.o. G2P2 Married Caucasian F here for annual exam/new patient exam.  Pt reports she is healthy.  Family hx significant for colon cancer and polyps.  Brother is 88 years older and had polyps so follow up was recommended for her.    Cycles have started to change over the past two to three years.  She can skip up to two or three months.  Typically, flow lasts 4-5 days.  On heavier months it will last up to 7.    H/O adenomyosis wtih CT showing findins consistent with adenomyosis and h/o two endometrial biopsies in 9/16 and 1/16.   She does have some concerns about right breast mass that she's felt over the last several months.  She did note this with her last MMG but the report makes no mention of this.  PCP:  Dr. Kenton Kingfisher.  Last appt was in 2017.  Blood work is all done with him.    LMP:  In January.  Pt doesn't keep track.       Sexually active: Yes.    The current method of family planning is vasectomy.    Exercising: Yes.    walking, treadmill  Smoker:  no  Health Maintenance: Pap:  07/06/15 negative, HR HPV negative, 06/23/14 negative, HR HPV negative  History of abnormal Pap:  no MMG:  11/29/16 BIRADS 1 negative  Colonoscopy:  05/24/16 normal- repeat 5 years  BMD:   never TDaP:  2017 Pneumonia vaccine(s):  never Zostavax:   never Hep C testing: has donated blood in last 2 years Screening Labs: PCP, Hb today: PCP   reports that she has never smoked. She has never used smokeless tobacco. She reports that she drinks alcohol. She reports that she does not use drugs.  Past Medical History:  Diagnosis Date  . Adenomyosis   . Allergy    YEAR AROUND ALLERGIES  . Anemia   . Anxiety    situational per pt.  . Arthritis   . GERD (gastroesophageal reflux disease)   . Hemorrhoids   . Hiatal hernia   . Hyperlipidemia    borderline, controlled with diet per pt.  . IBS (irritable bowel syndrome)   . Seasonal allergies   . Tubular adenoma of colon 04/2011    Past Surgical History:   Procedure Laterality Date  . COLONOSCOPY    . MAXIMUM ACCESS (MAS)POSTERIOR LUMBAR INTERBODY FUSION (PLIF) 1 LEVEL N/A 02/01/2014   Procedure: LUMBAR FIVE-SACFAL ONE POSTERIOR LUMBAR INTERBODY FUSION,GIL PROCEDURE;  Surgeon: Erline Levine, MD;  Location: [facility];  Service: Neurosurgery;  Laterality: N/A;  . TONSILLECTOMY      Current Outpatient Prescriptions  Medication Sig Dispense Refill  . B Complex-C-Iron (SUPER B-COMPLEX/IRON/VITAMIN C) TABS Take 1 tablet by mouth as needed.     . DUEXIS 800-26.6 MG TABS Take 1 tablet by mouth 3 (three) times daily as needed.  2  . Fiber CHEW Chew 1 tablet by mouth daily as needed.    Marland Kitchen glycopyrrolate (ROBINUL) 1 MG tablet Take 1 tablet (1 mg total) by mouth 2 (two) times daily as needed. 60 tablet 11  . KRILL OIL PO Take 1 capsule by mouth daily.     . Multiple Vitamin (MULTIVITAMIN PO) Take 1 tablet by mouth daily.     Marland Kitchen PENNSAID 2 % SOLN APPLY TWO PUMPS TOPICALLY TO THE AFFECTED AREA TWICE DAILY AS NEEDED FOR PAIN  2  . Probiotic Product (DIGESTIVE ADVANTAGE PO) Take 1 tablet by mouth daily.    Marland Kitchen  VITAMIN D, ERGOCALCIFEROL, PO Take 1,000 Units by mouth daily.      No current facility-administered medications for this visit.     Family History  Problem Relation Age of Onset  . Colon cancer Paternal Grandfather   . Diabetes Father   . Colon polyps Brother   . Breast cancer Cousin   . Diabetes Mother   . Hypertension Mother   . Hypertension Sister     ROS:  Pertinent items are noted in HPI.  Otherwise, a comprehensive ROS was negative.  Exam:   BP (!) 146/78 (BP Location: Right Arm, Patient Position: Sitting, Cuff Size: Normal)   Pulse 84   Resp 14   Ht 5' 2.25" (1.581 m)   Wt 133 lb 9.6 oz (60.6 kg)   LMP  (Within Months) Comment: ~2 months   BMI 24.24 kg/m    Height: 5' 2.25" (158.1 cm)  Ht Readings from Last 3 Encounters:  01/10/17 5' 2.25" (1.581 m)  05/24/16 5\' 2"  (1.575 m)  02/26/16 5' 2.25" (1.581 m)    General  appearance: alert, cooperative and appears stated age Head: Normocephalic, without obvious abnormality, atraumatic Neck: no adenopathy, supple, symmetrical, trachea midline and thyroid normal to inspection and palpation Lungs: clear to auscultation bilaterally Breasts: Right breast smaller than left, right breast mass noted to right of nipple (pt can find as well), no LAD, left breast normal skin, no masses, no nipple discharge, no LAD Heart: regular rate and rhythm Abdomen: soft, non-tender; bowel sounds normal; no masses,  no organomegaly Extremities: extremities normal, atraumatic, no cyanosis or edema Skin: Skin color, texture, turgor normal. No rashes or lesions Lymph nodes: Cervical, supraclavicular, and axillary nodes normal. No abnormal inguinal nodes palpated Neurologic: Grossly normal   Pelvic: External genitalia:  no lesions              Urethra:  normal appearing urethra with no masses, tenderness or lesions              Bartholins and Skenes: normal                 Vagina: normal appearing vagina with normal color and discharge, no lesions              Cervix: no lesions              Pap taken: Yes.   Bimanual Exam:  Uterus:  normal size, contour, position, consistency, mobility, non-tender              Adnexa: normal adnexa and no mass, fullness, tenderness               Rectovaginal: Confirms               Anus:  normal sphincter tone, no lesions  Chaperone was present for exam.  A:  Well Woman with normal exam White coat hypertension Asymmetric breasts, R<L H/O adenomyosis diagnosed on imaging Perimenopausal bleeding pattern Breast mass Family hx of colon cancer and colon polyps  P:   Mammogram UTD but diagnostic Right MMG with ultrasound will be scheduled pap smear obtained today.  Pt desires yearly pap smears.  Had neg HR HPV 2016.   Lab work done with PCP, Dr. Kenton Kingfisher return annually or prn

## 2017-01-16 ENCOUNTER — Ambulatory Visit
Admission: RE | Admit: 2017-01-16 | Discharge: 2017-01-16 | Disposition: A | Discharge status: Home / Self Care | Payer: BLUE CROSS/BLUE SHIELD | Source: Ambulatory Visit | Attending: Obstetrics & Gynecology | Admitting: Obstetrics & Gynecology

## 2017-01-16 DIAGNOSIS — R922 Inconclusive mammogram: Secondary | ICD-10-CM | POA: Diagnosis not present

## 2017-01-16 DIAGNOSIS — N644 Mastodynia: Secondary | ICD-10-CM | POA: Diagnosis not present

## 2017-01-16 DIAGNOSIS — N6310 Unspecified lump in the right breast, unspecified quadrant: Secondary | ICD-10-CM

## 2017-01-16 LAB — CYTOLOGY - PAP
DIAGNOSIS: UNDETERMINED — AB
HPV: NOT DETECTED

## 2017-03-21 ENCOUNTER — Ambulatory Visit: Payer: BLUE CROSS/BLUE SHIELD | Admitting: Obstetrics & Gynecology

## 2017-03-29 DIAGNOSIS — A932 Colorado tick fever: Secondary | ICD-10-CM | POA: Diagnosis not present

## 2017-09-05 DIAGNOSIS — R21 Rash and other nonspecific skin eruption: Secondary | ICD-10-CM | POA: Diagnosis not present

## 2017-09-05 DIAGNOSIS — J309 Allergic rhinitis, unspecified: Secondary | ICD-10-CM | POA: Diagnosis not present

## 2017-09-05 DIAGNOSIS — L729 Follicular cyst of the skin and subcutaneous tissue, unspecified: Secondary | ICD-10-CM | POA: Diagnosis not present

## 2017-09-05 DIAGNOSIS — R1031 Right lower quadrant pain: Secondary | ICD-10-CM | POA: Diagnosis not present

## 2017-10-09 ENCOUNTER — Telehealth: Payer: Self-pay | Admitting: Obstetrics & Gynecology

## 2017-10-09 NOTE — Telephone Encounter (Signed)
Spoke with patient. Patient states that she has been having increased hot flashes that last all day and all night. Are intermittent through out the day. Having night sweats. Has been having ongoing cramping that feels like uterine and abdominal cramping. Has had a change in bowel habits. Is having frequent diarrhea. Reports she usually has problems with constipation. This has been going on for months. Read that this could be related to hormonal changes. Patient is scheduled to see GI in February. Cant see PCP until March. Wants Dr.Millers advice on if this could be related to menopause or if its most likely a GI issue.

## 2017-10-09 NOTE — Telephone Encounter (Signed)
Cycles were changing at her last exam.  I think she should come in for hormonal testing.  OK to order [facility] and estradiol.  Yes, this could be hormonally related.

## 2017-10-09 NOTE — Telephone Encounter (Signed)
Patient is having menopausal issues. °

## 2017-10-10 NOTE — Telephone Encounter (Signed)
Spoke with patient. Advised of message as seen below from [place]. Patient is agreeable. Lab appointment scheduled for 10/24/2016 at 11 am. Encounter closed.

## 2017-10-24 ENCOUNTER — Other Ambulatory Visit (INDEPENDENT_AMBULATORY_CARE_PROVIDER_SITE_OTHER): Payer: BLUE CROSS/BLUE SHIELD

## 2017-10-24 DIAGNOSIS — N951 Menopausal and female climacteric states: Secondary | ICD-10-CM | POA: Diagnosis not present

## 2017-10-24 DIAGNOSIS — E785 Hyperlipidemia, unspecified: Secondary | ICD-10-CM | POA: Diagnosis not present

## 2017-10-24 DIAGNOSIS — Z0000 Encounter for general adult medical examination without abnormal findings: Secondary | ICD-10-CM | POA: Diagnosis not present

## 2017-10-24 DIAGNOSIS — R1011 Right upper quadrant pain: Secondary | ICD-10-CM | POA: Diagnosis not present

## 2017-10-25 LAB — FOLLICLE STIMULATING HORMONE: FSH: 93.1 m[IU]/mL

## 2017-10-25 LAB — ESTRADIOL: Estradiol: <5 pg/mL

## 2017-10-27 ENCOUNTER — Other Ambulatory Visit: Payer: Self-pay | Admitting: Obstetrics & Gynecology

## 2017-10-27 DIAGNOSIS — Z139 Encounter for screening, unspecified: Secondary | ICD-10-CM

## 2017-10-29 ENCOUNTER — Telehealth: Payer: Self-pay

## 2017-10-29 ENCOUNTER — Encounter: Payer: Self-pay | Admitting: Obstetrics & Gynecology

## 2017-10-29 DIAGNOSIS — N8000 Endometriosis of the uterus, unspecified: Secondary | ICD-10-CM | POA: Insufficient documentation

## 2017-10-29 NOTE — Telephone Encounter (Signed)
Spoke with patient and notified of lab results. She plans to keep GI appointment but does not want to make appointment with Dr.Miller at this time stating she would not take any HRT. Symptoms from GYN standpoint have become more tolerable and doesn't wish to do anything for them at this time. She is doing some herbal remedies. She will keep AEX in June with Dr.Miller and call sooner if symptoms worsen. She thanked [place] for our time. Routing to provider

## 2017-10-29 NOTE — Telephone Encounter (Signed)
-----   Message from Megan Salon, MD sent at 10/29/2017  9:26 AM EST ----- Please let pt know her blood work shows she is in menopause.  I think keeping GI appt is good but needs appt to discuss if wants to consider HRT to see if symptoms will improve or discuss other non-hormonal options.

## 2017-11-11 ENCOUNTER — Telehealth: Payer: Self-pay | Admitting: Obstetrics & Gynecology

## 2017-11-11 NOTE — Telephone Encounter (Signed)
Patient is having some menopausal issues and would like to see Dr.Miller on a late Friday afternoon. Patient states she is also having "stomacg pains" and would like to rule out any GYN issue.

## 2017-11-11 NOTE — Telephone Encounter (Signed)
Call to patient. Patient states her LMP was around the beginning of November. Patient states, "since then everything has gone crazy." Patient states she has been experiencing hot flashes, and has been waking up 3-4 times a night having to take the covers off of her or put them back on. Patient also states she typically has constipation issues, but since LMP "food runs completely through me, so I stopped taking my daily fiber to see if that would help and it didn't." States she only has bowel movement once a day, but it is loose which is not normal for her. Patient saw PCP last week for physical and he did "a lot of blood work, and a level to check for inflammation which was normal." Also states he told her to start taking her fiber again daily. Patient states she has had gallbladder issues in the past and has a "spastic colon," but wants to make sure this isn't "female related" as she has also had an ovarian cyst and "it kinda feels like that now. I'm having cramping like I'm going to start and then I don't." Patient states she has an appointment with her GI MD on 2/8. RN advised OV recommended for further evaluation. Patient requests Friday afternoon appointment. Patient scheduled for Friday 11/14/17 at 1530. Patient agreeable to date and time of appointment. Aware will review with Dr. Sabra Heck and if any additional recommendations will return call. Patient agreeable.   Routing to provider for review.

## 2017-11-14 ENCOUNTER — Encounter: Payer: Self-pay | Admitting: Obstetrics & Gynecology

## 2017-11-14 ENCOUNTER — Ambulatory Visit: Payer: BLUE CROSS/BLUE SHIELD | Admitting: Obstetrics & Gynecology

## 2017-11-14 VITALS — BP 144/70 | HR 80 | Resp 16 | Wt 131.0 lb

## 2017-11-14 DIAGNOSIS — N898 Other specified noninflammatory disorders of vagina: Secondary | ICD-10-CM

## 2017-11-14 DIAGNOSIS — R102 Pelvic and perineal pain: Secondary | ICD-10-CM

## 2017-11-14 DIAGNOSIS — N912 Amenorrhea, unspecified: Secondary | ICD-10-CM

## 2017-11-14 DIAGNOSIS — R1011 Right upper quadrant pain: Secondary | ICD-10-CM

## 2017-11-14 LAB — POCT URINALYSIS DIPSTICK
BILIRUBIN UA: NEGATIVE
Glucose, UA: NEGATIVE
KETONES UA: NEGATIVE
Leukocytes, UA: NEGATIVE
NITRITE UA: NEGATIVE
PROTEIN UA: NEGATIVE
Urobilinogen, UA: 0.2 E.U./dL
pH, UA: 5 (ref 5.0–8.0)

## 2017-11-14 LAB — POCT URINE PREGNANCY: Preg Test, Ur: NEGATIVE

## 2017-11-14 MED ORDER — SULFAMETHOXAZOLE-TRIMETHOPRIM 800-160 MG PO TABS: 1.0000 | ORAL_TABLET | Freq: Two times a day (BID) | ORAL | 0 refills | Status: DC

## 2017-11-14 NOTE — Progress Notes (Signed)
GYNECOLOGY  VISIT  CC:   Pelvic pain  HPI: 57 y.o. G2P2 Married Caucasian female here for pelvic pain that started three days ago.  This is associated with dysuria but pt is not sure this is a UTI.  Denies back pain or fever with this.      Last cycle was November 11/18.  Cycle was very short and light.  Prior to that time, she had been having a monthly cycle.  Feels like she's had more vaginal discharge over the past three days.  Feels like she could be ovulating.  Reviewed 10/24/17 [place] which was 93.  D/w pt what results means and, therefore, I think ovulation at this point is fairly unlikely.  Pt also has some GI concerns.  Reports since November, she's been having more lose bowel movements.  She has had two episodes of this being very loose and watery.  Cannot figure out if there a trigger in what she is eating.  Does have appt with [facility] 11/28/16.  This is associated with RUQ pain.  This pain is different than the pelvic pain.  Can be epigastric as well as RUQ.  Denies nausea or vomiting.   Has many, many questions today.  GYNECOLOGIC HISTORY: Patient's last menstrual period was 08/21/2017. Contraception: vasectomy  Menopausal hormone therapy: none  Patient Active Problem List   Diagnosis Date Noted  . Endometriosis of uterus 10/29/2017  . Spondylolysis of lumbosacral region 02/01/2014    Past Medical History:  Diagnosis Date  . Adenomyosis   . Allergy    YEAR AROUND ALLERGIES  . Anemia   . Anxiety    situational per pt.  . Arthritis   . GERD (gastroesophageal reflux disease)   . Hemorrhoids   . Hiatal hernia   . Hyperlipidemia    borderline, controlled with diet per pt.  . IBS (irritable bowel syndrome)   . Seasonal allergies   . Tubular adenoma of colon 04/2011    Past Surgical History:  Procedure Laterality Date  . COLONOSCOPY    . MAXIMUM ACCESS (MAS)POSTERIOR LUMBAR INTERBODY FUSION (PLIF) 1 LEVEL N/A 02/01/2014   Procedure: LUMBAR FIVE-SACFAL ONE POSTERIOR LUMBAR  INTERBODY FUSION,GIL PROCEDURE;  Surgeon: Erline Levine, MD;  Location: [facility];  Service: Neurosurgery;  Laterality: N/A;  . TONSILLECTOMY      MEDS:   Current Outpatient Medications on File Prior to Visit  Medication Sig Dispense Refill  . Fiber CHEW Chew 1 tablet by mouth daily as needed.    Marland Kitchen glycopyrrolate (ROBINUL) 1 MG tablet Take 1 tablet (1 mg total) by mouth 2 (two) times daily as needed. 60 tablet 11  . halobetasol (ULTRAVATE) 0.05 % ointment daily as needed.    Marland Kitchen KRILL OIL PO Take 1 capsule by mouth daily.     . Multiple Vitamin (MULTIVITAMIN PO) Take 1 tablet by mouth daily.     . Probiotic Product (DIGESTIVE ADVANTAGE PO) Take 1 tablet by mouth daily.    Marland Kitchen VITAMIN D, ERGOCALCIFEROL, PO Take 1,000 Units by mouth daily.      No current facility-administered medications on file prior to visit.     ALLERGIES: Codeine  Family History  Problem Relation Age of Onset  . Colon cancer Paternal Grandfather   . Diabetes Father   . Colon polyps Brother   . Breast cancer Cousin   . Diabetes Mother   . Hypertension Mother   . Hypertension Sister     SH:  Married, non smoker  Review of Systems  Gastrointestinal: Positive for abdominal pain.       Bloating  Genitourinary:       Menstrual cycle changes   All other systems reviewed and are negative.   PHYSICAL EXAMINATION:    BP (!) 144/70 (BP Location: Right Arm, Patient Position: Sitting, Cuff Size: Normal)   Pulse 80   Resp 16   Wt 131 lb (59.4 kg)   LMP 08/21/2017   BMI 23.77 kg/m     General appearance: alert, cooperative and appears stated age Neck: no adenopathy, supple, symmetrical, trachea midline and thyroid normal to inspection and palpation CV:  Regular rate and rhythm Lungs:  clear to auscultation, no wheezes, rales or rhonchi, symmetric air entry Abdomen: soft, mild mid-epigastric tenderness to palpation as well as mild suprapubic tenderness to palpation, bowel sounds normal; no masses,  no  organomegaly  Pelvic: External genitalia:  no lesions              Urethra:  normal appearing urethra with no masses, tenderness or lesions              Bartholins and Skenes: normal                 Vagina: normal appearing vagina with normal color and discharge, no lesions              Cervix: no lesions              Bimanual Exam:  Uterus:  normal size, contour, position, consistency, mobility, non-tender              Adnexa: no mass, fullness, tenderness              Anus:  normal sphincter tone, no lesions  Chaperone was present for exam.  Assessment: Pelvic pain Epigastric pain Vaginal discharge without odor Possible cystitis  Plan: Urine culture pending. Will start treatment with Bactrim DS BID x 3 days Amylase, lipase, CBC, liver panle Affirm pending. Pt given strict precautions for going to ER over the weekend if any of her symptoms change or worsen.   ~30 minutes spent with patient >50% of time was in face to face discussion of above.

## 2017-11-15 LAB — HEPATIC FUNCTION PANEL
ALK PHOS: 72 IU/L (ref 39–117)
ALT: 12 IU/L (ref 0–32)
AST: 15 IU/L (ref 0–40)
Albumin: 4.4 g/dL (ref 3.5–5.5)
BILIRUBIN, DIRECT: 0.09 mg/dL (ref 0.00–0.40)
Bilirubin Total: <0.2 mg/dL (ref 0.0–1.2)
Total Protein: 6.6 g/dL (ref 6.0–8.5)

## 2017-11-15 LAB — CBC WITH DIFFERENTIAL/PLATELET
Basophils Absolute: 0.1 10*3/uL (ref 0.0–0.2)
Basos: 1 %
EOS (ABSOLUTE): 0.2 10*3/uL (ref 0.0–0.4)
EOS: 3 %
HEMATOCRIT: 40.5 % (ref 34.0–46.6)
Hemoglobin: 13 g/dL (ref 11.1–15.9)
Immature Grans (Abs): 0 10*3/uL (ref 0.0–0.1)
Immature Granulocytes: 0 %
LYMPHS ABS: 1.8 10*3/uL (ref 0.7–3.1)
Lymphs: 31 %
MCH: 28.3 pg (ref 26.6–33.0)
MCHC: 32.1 g/dL (ref 31.5–35.7)
MCV: 88 fL (ref 79–97)
MONOS ABS: 0.7 10*3/uL (ref 0.1–0.9)
Monocytes: 12 %
Neutrophils Absolute: 3.1 10*3/uL (ref 1.4–7.0)
Neutrophils: 53 %
PLATELETS: 280 10*3/uL (ref 150–379)
RBC: 4.6 x10E6/uL (ref 3.77–5.28)
RDW: 13.4 % (ref 12.3–15.4)
WBC: 5.9 10*3/uL (ref 3.4–10.8)

## 2017-11-15 LAB — AMYLASE: Amylase: 121 U/L (ref 31–124)

## 2017-11-15 LAB — VAGINITIS/VAGINOSIS, DNA PROBE
Candida Species: NEGATIVE
Gardnerella vaginalis: POSITIVE — AB
TRICHOMONAS VAG: NEGATIVE

## 2017-11-15 LAB — LIPASE: Lipase: 40 U/L (ref 14–72)

## 2017-11-18 ENCOUNTER — Other Ambulatory Visit: Payer: Self-pay | Admitting: *Deleted

## 2017-11-18 LAB — URINE CULTURE: ORGANISM ID, BACTERIA: NO GROWTH

## 2017-11-18 MED ORDER — METRONIDAZOLE 0.75 % VA GEL: 1.0000 | Freq: Every day | VAGINAL | 0 refills | Status: AC

## 2017-11-21 ENCOUNTER — Telehealth: Payer: Self-pay | Admitting: Obstetrics & Gynecology

## 2017-11-21 DIAGNOSIS — H2513 Age-related nuclear cataract, bilateral: Secondary | ICD-10-CM | POA: Diagnosis not present

## 2017-11-21 DIAGNOSIS — H25013 Cortical age-related cataract, bilateral: Secondary | ICD-10-CM | POA: Diagnosis not present

## 2017-11-21 DIAGNOSIS — H40053 Ocular hypertension, bilateral: Secondary | ICD-10-CM | POA: Diagnosis not present

## 2017-11-21 DIAGNOSIS — H40013 Open angle with borderline findings, low risk, bilateral: Secondary | ICD-10-CM | POA: Diagnosis not present

## 2017-11-21 NOTE — Telephone Encounter (Signed)
Spoke with patient. Advised urine culture from 11/14/2017 was negative. No signs of infection. Patient verbalizes understanding. Is still using Metrogel for BV feels a lot of improvement, but not 100 %. Reports she still has a day or two left of using Metrogel. Advised to complete 5 day course and if symptoms persist after treatment will need to contact the office. Patient is agreeable.  Routing to provider for final review. Patient agreeable to disposition.

## 2017-11-21 NOTE — Telephone Encounter (Signed)
Patient got a MyChart message stating she has results ready but she can't see them. She said she was recently seen for a urinary tract infection.

## 2017-11-27 ENCOUNTER — Telehealth: Payer: Self-pay | Admitting: Obstetrics & Gynecology

## 2017-11-27 NOTE — Telephone Encounter (Signed)
Reviewed with Dr. Sabra Heck. Keep appt as scheduled.   Call returned to patient, advised will keep OV as scheduled for 2/8 at 3:45pm. Patient verbalizes understanding and is agreeable.   Routing to provider for final review. Patient is agreeable to disposition. Will close encounter.

## 2017-11-27 NOTE — Telephone Encounter (Signed)
Patient called to report her symptoms have not improved since her last visit with the new medications prescribed. She'd like to speak with the nurse about recommendations.

## 2017-11-27 NOTE — Telephone Encounter (Addendum)
Spoke with patient. Patient has completed metrogel for BV, still experiencing pelvic discomfort and cramping, "feels like I am ovulating". White vaginal d/c with odor.   Denies urinary symptoms, N/V, fever/chills.   Recommended OV for further evaluation, patient states she is scheduled with GI on 2/8 at 1:30pm. Patient declined OV for today. OV scheduled for 2/8 at 3:45pm with Dr. Sabra Heck. Advised Dr. Sabra Heck will review, I will return call with any additional recommendations, patient is agreeable.   Routing to provider for final review. Patient is agreeable to disposition. Will close encounter.

## 2017-11-27 NOTE — Telephone Encounter (Signed)
Spoke with patient, advised as seen below per Dr. Sabra Heck. Patient states she is agreeable to PUS if no CT ordered by GI.   Patient still requesting OV for evaluation of vaginal d/c. Patient states she does not believe BV has resolved or yeast has developed. Advised will keep OV as scheduled until further review with Dr. Sabra Heck. Patient is agreeable.   Dr. Sabra Heck -please review.

## 2017-11-27 NOTE — Telephone Encounter (Signed)
I think she should have her GI evaluation first and see what the plan is going to be--for example if a CT is going to be scheduled.  If that is not the recommendation, then I want her to do a PUS and that cannot be done Friday afternoon.  This has been going on several weeks so I think doing the most appropriate test is the best thing at this point.  I would like to cancel that appt, see what GI says and orders and proceed with PUS if no CT is ordered.  Thanks.

## 2017-11-28 ENCOUNTER — Encounter: Payer: Self-pay | Admitting: Gastroenterology

## 2017-11-28 ENCOUNTER — Other Ambulatory Visit: Payer: Self-pay | Admitting: *Deleted

## 2017-11-28 ENCOUNTER — Ambulatory Visit: Payer: BLUE CROSS/BLUE SHIELD | Admitting: Obstetrics & Gynecology

## 2017-11-28 ENCOUNTER — Ambulatory Visit: Payer: BLUE CROSS/BLUE SHIELD | Admitting: Gastroenterology

## 2017-11-28 ENCOUNTER — Encounter: Payer: Self-pay | Admitting: Obstetrics & Gynecology

## 2017-11-28 ENCOUNTER — Other Ambulatory Visit: Payer: Self-pay

## 2017-11-28 VITALS — BP 126/70 | HR 74 | Ht 63.0 in | Wt 130.0 lb

## 2017-11-28 VITALS — BP 144/76 | HR 70 | Resp 14 | Wt 131.0 lb

## 2017-11-28 DIAGNOSIS — N898 Other specified noninflammatory disorders of vagina: Secondary | ICD-10-CM

## 2017-11-28 DIAGNOSIS — R140 Abdominal distension (gaseous): Secondary | ICD-10-CM

## 2017-11-28 DIAGNOSIS — Z86010 Personal history of colonic polyps: Secondary | ICD-10-CM | POA: Diagnosis not present

## 2017-11-28 DIAGNOSIS — K219 Gastro-esophageal reflux disease without esophagitis: Secondary | ICD-10-CM

## 2017-11-28 DIAGNOSIS — N912 Amenorrhea, unspecified: Secondary | ICD-10-CM

## 2017-11-28 DIAGNOSIS — R102 Pelvic and perineal pain: Secondary | ICD-10-CM

## 2017-11-28 DIAGNOSIS — K588 Other irritable bowel syndrome: Secondary | ICD-10-CM

## 2017-11-28 DIAGNOSIS — R3129 Other microscopic hematuria: Secondary | ICD-10-CM

## 2017-11-28 DIAGNOSIS — R1011 Right upper quadrant pain: Secondary | ICD-10-CM

## 2017-11-28 MED ORDER — DICYCLOMINE HCL 10 MG PO CAPS: 10.0000 mg | ORAL_CAPSULE | Freq: Three times a day (TID) | ORAL | 11 refills | Status: DC

## 2017-11-28 MED ORDER — OMEPRAZOLE 20 MG PO CPDR: 20.0000 mg | DELAYED_RELEASE_CAPSULE | Freq: Every day | ORAL | 11 refills | Status: DC

## 2017-11-28 NOTE — Progress Notes (Signed)
    History of Present Illness: This is a 57 year old female who relates right upper quadrant and right flank pain that radiates to the back along with generalized abdominal cramping and bloating.  She tended to have constipation but now her stools are less well formed.  She is having bowel movements once daily.  She states she is going through menopause and has pelvic pain as well that is being evaluated by her gynecologist.  She relates no medication or diet changes.  She notes that peanuts and caffeine tend to exacerbate her symptoms.she relates that meals tend to exacerbate symptoms.  She relates intermittent heartburn and belching.  Blood work from late January reviewed and it was unremarkable.  She states her PCP refilled what she thought was glycopyrrolate and she felt some mild change in perception of thinking after taking it so she discontinued. Denies weight loss, constipation, diarrhea, change in stool caliber, melena, hematochezia, nausea, vomiting, dysphagia, chest pain.  Colonoscopy 05/2016 - Internal hemorrhoids. - The examination was otherwise normal on direct and retroflexion views.  Current Medications, Allergies, Past Medical History, Past Surgical History, Family History and Social History were reviewed in [employer] record.  Physical Exam: General: Well developed, well nourished, no acute distress Head: Normocephalic and atraumatic Eyes:  sclerae anicteric, EOMI Ears: Normal auditory acuity Mouth: No deformity or lesions Lungs: Clear throughout to auscultation Heart: Regular rate and rhythm; no murmurs, rubs or bruits Abdomen: Soft, mild RUQ tenderness and non distended. No masses, hepatosplenomegaly or hernias noted. Normal Bowel sounds Rectal: not done Musculoskeletal: Symmetrical with no gross deformities  Pulses:  Normal pulses noted Extremities: No clubbing, cyanosis, edema or deformities noted Neurological: Alert oriented x 4, grossly  nonfocal Psychological:  Alert and cooperative. Normal mood and affect  Assessment and Recommendations:  1.  Suspected IBS with slight change in bowel function.  Recent blood work unremarkable.  Colonoscopy less than 1.5 years ago unremarkable.  Schedule abdominal ultrasound.  Begin dicyclomine 10 mg 3 times daily ac.  Trial of a complete lactose-free diet for 7 days, if not effective then avoid high fat foods for 7 days and if not effective avoid raw fruits and vegetables for 7 days. REV in 4-6 weeks.  2.  GERD.  Begin standard antireflux measures and omeprazole 20 mg daily. REV in 4-6 weeks.  3.  Personal history of adenomatous colon polyps.  A 5-year interval colonoscopy is recommended in August 2022.

## 2017-11-28 NOTE — Progress Notes (Addendum)
GYNECOLOGY  VISIT  CC:   Vaginal discharge  HPI: 57 y.o. G2P2 Married Caucasian female here for complaint of abdominal pain.  She was seen on 11/14/17 and exam was normal.  Blood work was all normal.  She did have microscopic blood in the dip urine but urine culture was negative.  Microscopic was not performed.  She continues to have lower abdominal pain.  She was seen by Dr. Fuller Plan today.  He believes she has spastic colon and was started on Bentyl and Prilosec 20 mg daily.  She does have follow-up planned.  Abdominal ultrasound is scheduled as well.  Patient has some concerns that gynecologic imaging is not been performed.  Her exam was completely normal but she states she would feel better if she knew everything was normal.  Denies fever.  Reports she still has vaginal discharge.  She thought maybe it was improving but it has not.  Denies itching.  Denies odor.  Has no new sexual partners.  GYNECOLOGIC HISTORY: No LMP recorded. Patient is not currently having periods (Reason: Perimenopausal). Contraception: Vasectomy Menopausal hormone therapy: None  Patient Active Problem List   Diagnosis Date Noted  . Endometriosis of uterus 10/29/2017  . Spondylolysis of lumbosacral region 02/01/2014    Past Medical History:  Diagnosis Date  . Adenomyosis   . Allergy    YEAR AROUND ALLERGIES  . Anemia   . Anxiety    situational per pt.  . Arthritis   . GERD (gastroesophageal reflux disease)   . Hemorrhoids   . Hiatal hernia   . Hyperlipidemia    borderline, controlled with diet per pt.  . IBS (irritable bowel syndrome)   . Seasonal allergies   . Tubular adenoma of colon 04/2011    Past Surgical History:  Procedure Laterality Date  . COLONOSCOPY    . MAXIMUM ACCESS (MAS)POSTERIOR LUMBAR INTERBODY FUSION (PLIF) 1 LEVEL N/A 02/01/2014   Procedure: LUMBAR FIVE-SACFAL ONE POSTERIOR LUMBAR INTERBODY FUSION,GIL PROCEDURE;  Surgeon: Erline Levine, MD;  Location: [place] [facility];  Service:  Neurosurgery;  Laterality: N/A;  . TONSILLECTOMY      MEDS:   Current Outpatient Medications on File Prior to Visit  Medication Sig Dispense Refill  . dicyclomine (BENTYL) 10 MG capsule Take 1 capsule (10 mg total) by mouth 3 (three) times daily before meals. 90 capsule 11  . Fiber CHEW Chew 1 tablet by mouth daily as needed.    Marland Kitchen glycopyrrolate (ROBINUL) 1 MG tablet Take 1 tablet (1 mg total) by mouth 2 (two) times daily as needed. 60 tablet 11  . halobetasol (ULTRAVATE) 0.05 % ointment daily as needed.    Marland Kitchen KRILL OIL PO Take 1 capsule by mouth daily.     . Multiple Vitamin (MULTIVITAMIN PO) Take 1 tablet by mouth daily.     Marland Kitchen omeprazole (PRILOSEC) 20 MG capsule Take 1 capsule (20 mg total) by mouth daily. 30 capsule 11  . Probiotic Product (DIGESTIVE ADVANTAGE PO) Take 1 tablet by mouth daily.    Marland Kitchen VITAMIN D, ERGOCALCIFEROL, PO Take 1,000 Units by mouth daily.      No current facility-administered medications on file prior to visit.     ALLERGIES: Codeine  Family History  Problem Relation Age of Onset  . Colon cancer Paternal Grandfather   . Diabetes Father   . Colon polyps Brother   . Breast cancer Cousin   . Diabetes Mother   . Hypertension Mother   . Hypertension Sister     SH: Married,  non-smoker  Review of Systems  Constitutional: Negative.   Respiratory: Negative.   Cardiovascular: Negative.   Gastrointestinal: Positive for abdominal pain. Negative for heartburn, nausea and vomiting.  Genitourinary: Negative for dysuria, frequency and urgency.    PHYSICAL EXAMINATION:    BP (!) 144/76 (BP Location: Right Arm, Patient Position: Sitting, Cuff Size: Normal)   Pulse 70   Resp 14   Wt 131 lb (59.4 kg)   BMI 23.21 kg/m     General appearance: alert, cooperative and appears stated age Abdomen: soft, non-tender; bowel sounds normal; no masses,  no organomegaly  Pelvic: External genitalia:  no lesions              Urethra:  normal appearing urethra with no  masses, tenderness or lesions              Bartholins and Skenes: normal                 Vagina: normal appearing vagina with normal color and whitish discharge present, no lesions              Cervix: no lesions              Bimanual Exam:  Uterus:  normal size, contour, position, consistency, mobility, non-tender              Adnexa: no mass, fullness, tenderness  Chaperone was present for exam.  Assessment: Pelvic pain Vaginal discharge Microscopic hematuria  Plan: Repeat vaginitis testing obtained today Patient will return for pelvic ultrasound.  She will also then proceed with the abdominal ultrasound later next week. Urine micro obtained today   ~20 minutes spent with patient >50% of time was in face to face discussion of above.

## 2017-11-28 NOTE — Patient Instructions (Signed)
We have sent the following medications to your pharmacy for you to pick up at your convenience: Bentyl and omeprazole.   You have been scheduled for an abdominal ultrasound at [facility] (1st floor of hospital) on 12/03/17 at 9:00am. Please arrive 15 minutes prior to your appointment for registration. Make certain not to have anything to eat or drink 6 hours prior to your appointment. Should you need to reschedule your appointment, please contact radiology at [phone]. This test typically takes about 30 minutes to perform.  Start a lactose free diet x 7 days. If no improvement in symptoms then avoid high fat foods x 7 days. If still no improvement in symptoms avoid raw fruits and vegetables x 7 days.   Thank you for choosing me and [facility].  Pricilla Riffle. Dagoberto Ligas., MD., Marval Regal

## 2017-11-29 LAB — URINALYSIS, MICROSCOPIC ONLY
BACTERIA UA: NONE SEEN
Casts: NONE SEEN /lpf
EPITHELIAL CELLS (NON RENAL): NONE SEEN /HPF (ref 0–10)

## 2017-12-02 ENCOUNTER — Ambulatory Visit (INDEPENDENT_AMBULATORY_CARE_PROVIDER_SITE_OTHER): Payer: BLUE CROSS/BLUE SHIELD | Admitting: Obstetrics & Gynecology

## 2017-12-02 ENCOUNTER — Other Ambulatory Visit: Payer: Self-pay | Admitting: Obstetrics & Gynecology

## 2017-12-02 ENCOUNTER — Ambulatory Visit (INDEPENDENT_AMBULATORY_CARE_PROVIDER_SITE_OTHER): Payer: BLUE CROSS/BLUE SHIELD

## 2017-12-02 DIAGNOSIS — R102 Pelvic and perineal pain: Secondary | ICD-10-CM

## 2017-12-02 DIAGNOSIS — N912 Amenorrhea, unspecified: Secondary | ICD-10-CM

## 2017-12-02 DIAGNOSIS — N8000 Endometriosis of the uterus, unspecified: Secondary | ICD-10-CM

## 2017-12-02 DIAGNOSIS — N393 Stress incontinence (female) (male): Secondary | ICD-10-CM | POA: Diagnosis not present

## 2017-12-02 DIAGNOSIS — N840 Polyp of corpus uteri: Secondary | ICD-10-CM

## 2017-12-02 LAB — NUSWAB BV AND CANDIDA, NAA
CANDIDA ALBICANS, NAA: NEGATIVE
CANDIDA GLABRATA, NAA: NEGATIVE

## 2017-12-02 NOTE — Progress Notes (Signed)
57 y.o. G2P2 MWF here for additional evaluation of pelvic pain.  Has been diagnosed with spastic colon and pt has started medications recommended by Dr. Fuller Plan but this hasn't made any difference yet for her symptoms.  She just is not convinced she is being treated for the correct thing at this time.    Pt was seen again for this on 11/28/17.  Repeat vaginitis testing was performed and was negative.  As well, urine micro was obtained due to microscopic hematuria noted on dip u/a.  The actual micro was negative for blood.  Pt did have a urologic evaluation done years ago.    She has an abdominal ultrasound scheduled for Friday and is here for pelvic ultrasound today.  No LMP recorded. Patient is not currently having periods (Reason: Perimenopausal).  Contraception:  PMP with [place] [facility] on 10/24/17  Technique:  Both transabdominal and transvaginal ultrasound examinations of the pelvis were performed. Transabdominal technique was performed for global imaging of the pelvis including uterus, ovaries, adnexal regions, and pelvic cul-de-sac.  It was necessary to proceed with endovaginal exam following the abdominal ultrasound transabdominal exam to visualize the endometrium and adnexa.  Color and duplex Doppler ultrasound was utilized to evaluate blood flow to the ovaries.    FINDINGS: Uterus: 8.1 x 5.9 x 4.7 cm Endometrium: 5 mm, 2 possible polyp noted Adnexa:  Left: 2.9 x 1.4 x 1.0 cm     Right: 2.8 x 2.0 x 2.1 cm with a 2.3 x 1.6 cm simple appearing cyst/follicle noted Cul de sac: No free fluid  SHSG: Because of the abnormal appearing of her endometrium sonohysterogram was recommended.  After obtaining appropriate verbal consent from patient, the cervix was visualized using a speculum, and prepped with betadine.  A tenaculum  was applied to the cervix.  Dilation of the cervix was necessary. The catheter was passed into the uterus and sterile saline introduced, with the following findings: two polyps each  >1cm, endometrium is thin around polyps.  With insertion of sterile saline, patient exacerbation of the exact pelvic pain that she has been experiencing over the last several weeks.    Discussion: Patient and I reviewed her images and findings with ultrasound sonohysterogram.  Again she is completely convinced that her pelvic pain is of uterine origin.  We discussed hysteroscopy and polyp resection for removal of polyps.  She asks about hysterectomy due to prior history of adenomyosis as well as polyps now today.  In addition she would like cyst and/or ovary removed.  She feels like she has had many years of gynecologic issues and  the last few weeks just adds to that.  She is desirous of being completely done and does not want to do procedure like hysteroscopy and turned around to proceed with hysterectomy.  I understand her concerns about having to have a second surgery but do feel we need to proceed with precertification process.  Patient also asked about management of stress urinary incontinence at the same time.  She does leak if she has coughing or is jumping up and down.  She has modified her exercise so that she generally does not leak.  She has seen urology in the past about this.  Would be interested in having a sling if that is appropriate and could be does have time.  Would like to be referred back to urology.  Assessment: Pelvic cramping Endometrial polyps >1cm each Stress urinary incontinence  Plan:   She is desirous of definitive surgery.  We  will proceed with precertification of hysterectomy refer back to urology.  Patient will need an endometrial biopsy prior to surgery if indeed hysterectomies will proceed with.  Laparoscopic approach, risks and benefits were discussed. Hospital stay, recovery and pain management all discussed.  Risks discussed including but not limited to bleeding, 1% risk of receiving a  transfusion, infection, 3-4% risk of bowel/bladder/ureteral/vascular injury  discussed as well as possible need for additional surgery if injury does occur discussed.  DVT/PE and rare risk of death discussed.  My actual complications with prior surgeries discussed.  Vaginal cuff dehiscence discussed.  Hernia formation discussed.  Positioning and incision locations discussed.  Patient aware if pathology abnormal she may need additional treatment.  All questions answered.  Visit was lengthy.  ~60 minutes spent with patient >50% of time was in face to face discussion of above.

## 2017-12-03 ENCOUNTER — Ambulatory Visit (HOSPITAL_COMMUNITY): Payer: BLUE CROSS/BLUE SHIELD

## 2017-12-05 ENCOUNTER — Encounter: Payer: Self-pay | Admitting: Obstetrics & Gynecology

## 2017-12-06 ENCOUNTER — Encounter: Payer: Self-pay | Admitting: Gastroenterology

## 2017-12-08 ENCOUNTER — Telehealth: Payer: Self-pay | Admitting: Obstetrics & Gynecology

## 2017-12-08 NOTE — Telephone Encounter (Signed)
Patient call to inquire about surgery benefits. Advised patient we are working to pre-certify benefits and will contact as soon as this information is available. Patient is agreeable.  Patient also mentioned in conversation, that she has an ultrasound in our office last week. Patient states she has been experiencing bleeding since the procedure, patient request to speak with a nurse in regards to these symptoms.   Routing to Triage Nurse

## 2017-12-08 NOTE — Telephone Encounter (Signed)
Spoke with patient. Patient states she is aware there would be some spotting after SHGM, spotting has continued, asking when it will stop?   Notices spotting when wiping and with increased activity.   No new or increased pain, pelvic pain is the same as when in the office on 12/02/17.   Denies any urinary symptoms, N/V, fever/chills, odor.   Advised patient to continue to monitor, spotting should resolve. Return call to office should bleeding increase to changing pad q1-2 hrs, pain becomes severe or any new symptoms develop. If after hours, seek care at [facility]. Will review with Dr. Sabra Heck and return call with any additional recommendations, patient is agreeable.   Routing to Dr. Sabra Heck

## 2017-12-09 ENCOUNTER — Telehealth: Payer: Self-pay | Admitting: Obstetrics & Gynecology

## 2017-12-09 NOTE — Telephone Encounter (Signed)
Appointment scheduled with Dr Matilde Sprang for 01-09-18.  When patient was notified of appointment by referral department, expressed that she didn't understand that this appointment was necessary.   Call to patient to review urology referral. Patient states she does not feel urinary leaking is an issue for her. She only experiences leaking with significant coughing or activity that involves jumping (trampoline).  After reviewing pros/cons with Dr Sabra Heck, she felt sling was not route she wanted unless Dr Sabra Heck feels there is significant risk of increased incontinence post op.  She is advised of potential for future procedure should incontinence become an issue later.   Date of 01-12-18 discussed.  Patient also agreeable to urology consult first but does not want it to delay surgery. Advised consult on 01-09-18 would not allow for combined procedure on 01-12-18.   Patient would like to proceed with hysterectomy, preferably in March, if Dr Sabra Heck feels this is acceptable.  Offered office visit to discuss, patient declines. Just wants to confirm with Dr Sabra Heck, ok to defer urology appointment.

## 2017-12-09 NOTE — Telephone Encounter (Signed)
Spoke with patient regarding benefit for surgery. Patient understood and agreeable. Patient has confirmed and is ready to proceed with scheduling. Patient aware this is professional benefit only. Patient aware will be contacted by hospital for separate benefits. Forwarding to Nurse Supervisor for scheduling ° °Routing to Sally Yeakley, RN °

## 2017-12-10 NOTE — Telephone Encounter (Signed)
Call to patient. Advised of response from Dr Sabra Heck.  Patient very pleased and desires to proceed with surgery. Surgery scheduled for 01-12-18 at 0730 at [facility]. Surgery instruction sheet reviewed and printed copy will be mailed. Urology appointment canceled.  Encounter closed.

## 2017-12-10 NOTE — Telephone Encounter (Signed)
Not have urology is fine with me as long as she is ok (and it looks like she is).  We discsused this in length at the [facility] and she was not completely decided but now looks like she is.  Fine to proceed with surgical planning.  Do not feel her urologist symptoms will worsen with hysterectomy.  Thanks.

## 2017-12-12 ENCOUNTER — Ambulatory Visit (HOSPITAL_COMMUNITY): Payer: BLUE CROSS/BLUE SHIELD

## 2017-12-12 ENCOUNTER — Ambulatory Visit
Admission: RE | Admit: 2017-12-12 | Discharge: 2017-12-12 | Disposition: A | Discharge status: Home / Self Care | Payer: BLUE CROSS/BLUE SHIELD | Source: Ambulatory Visit | Attending: Obstetrics & Gynecology | Admitting: Obstetrics & Gynecology

## 2017-12-12 ENCOUNTER — Encounter (HOSPITAL_COMMUNITY): Payer: Self-pay

## 2017-12-12 ENCOUNTER — Ambulatory Visit (HOSPITAL_COMMUNITY)
Admission: RE | Admit: 2017-12-12 | Discharge: 2017-12-12 | Disposition: A | Discharge status: Home / Self Care | Payer: BLUE CROSS/BLUE SHIELD | Source: Ambulatory Visit | Attending: Gastroenterology | Admitting: Gastroenterology

## 2017-12-12 ENCOUNTER — Other Ambulatory Visit: Payer: Self-pay | Admitting: Gastroenterology

## 2017-12-12 DIAGNOSIS — R1011 Right upper quadrant pain: Secondary | ICD-10-CM | POA: Insufficient documentation

## 2017-12-12 DIAGNOSIS — K7689 Other specified diseases of liver: Secondary | ICD-10-CM | POA: Insufficient documentation

## 2017-12-12 DIAGNOSIS — Z1231 Encounter for screening mammogram for malignant neoplasm of breast: Secondary | ICD-10-CM | POA: Diagnosis not present

## 2017-12-12 DIAGNOSIS — K588 Other irritable bowel syndrome: Secondary | ICD-10-CM

## 2017-12-12 DIAGNOSIS — Z139 Encounter for screening, unspecified: Secondary | ICD-10-CM

## 2017-12-16 ENCOUNTER — Other Ambulatory Visit: Payer: Self-pay | Admitting: Obstetrics & Gynecology

## 2017-12-24 NOTE — Patient Instructions (Addendum)
Your procedure is scheduled on:  Monday, March 25  Enter through the Main Entrance of [facility] at: 6 am  Pick up the phone at the desk and dial [phone].  Call this number if you have problems the morning of surgery: [phone].  Remember: Do NOT eat or Do NOT drink clear liquids (including water) after midnight Sunday  Take these medicines the morning of surgery with a SIP OF WATER: omeprazole  Stop herbal medications and supplements 1 week prior to surgery.  Do NOT wear jewelry (body piercing), metal hair clips/bobby pins, make-up, or nail polish. Do NOT wear lotions, powders, or perfumes.  You may wear deoderant. Do NOT shave for 48 hours prior to surgery. Do NOT bring valuables to the hospital.  Leave suitcase in car.  After surgery it may be brought to your room.  For patients admitted to the hospital, checkout time is 11:00 AM the day of discharge. Have a responsible adult drive you home and stay with you for 24 hours after your procedure.  Home Daughter-In-Law Vicente Males cell [phone].

## 2017-12-26 ENCOUNTER — Encounter: Payer: Self-pay | Admitting: Obstetrics & Gynecology

## 2017-12-26 ENCOUNTER — Ambulatory Visit (INDEPENDENT_AMBULATORY_CARE_PROVIDER_SITE_OTHER): Payer: BLUE CROSS/BLUE SHIELD | Admitting: Obstetrics & Gynecology

## 2017-12-26 VITALS — BP 136/70 | HR 96 | Resp 14 | Wt 128.0 lb

## 2017-12-26 DIAGNOSIS — R102 Pelvic and perineal pain: Secondary | ICD-10-CM | POA: Diagnosis not present

## 2017-12-26 DIAGNOSIS — N858 Other specified noninflammatory disorders of uterus: Secondary | ICD-10-CM | POA: Diagnosis not present

## 2017-12-26 DIAGNOSIS — R9389 Abnormal findings on diagnostic imaging of other specified body structures: Secondary | ICD-10-CM

## 2017-12-26 MED ORDER — ESTRADIOL 0.1 MG/GM VA CREA: TOPICAL_CREAM | VAGINAL | 0 refills | Status: DC

## 2017-12-26 NOTE — Addendum Note (Signed)
Addended by: Megan Salon on: 12/26/2017 05:15 PM   Modules accepted: Orders

## 2017-12-26 NOTE — Progress Notes (Signed)
57 y.o. G2P2 MarriedCaucasian female here for discussion of upcoming procedure scheduled for 01/12/18.  Total laparoscopic hysterectomy with bilateral salpingoophrectomy, cystoscopy due to chronic pelvic pain, endometrial polyp, ovarian cyst.  She absolutely wants her ovary removed.  Aware of risks of ovary removal include both cardiovascular disease and bone disease.    Procedure discussed with patient.  Hospital stay, recovery and pain management all discussed.  Risks discussed including but not limited to bleeding, 1% risk of receiving a  transfusion, infection, 3-4% risk of bowel/bladder/ureteral/vascular injury discussed as well as possible need for additional surgery if injury does occur discussed.  DVT/PE and rare risk of death discussed.  My actual complications with prior surgeries discussed.  Vaginal cuff dehiscence discussed.  Hernia formation discussed.  Positioning and incision locations discussed.  Patient aware if pathology abnormal she may need additional treatment.  All questions answered.    Ob Hx:   Patient's last menstrual period was 08/21/2017 (approximate).          Sexually active: Yes.   Birth control: vasectomy Last pap: 01/10/17 ASCUS. HR HPV:neg  Last MMG: 12/12/17 BIRADS1:neg  Tobacco: No  Past Surgical History:  Procedure Laterality Date  . COLONOSCOPY    . MAXIMUM ACCESS (MAS)POSTERIOR LUMBAR INTERBODY FUSION (PLIF) 1 LEVEL N/A 02/01/2014   Procedure: LUMBAR FIVE-SACFAL ONE POSTERIOR LUMBAR INTERBODY FUSION,GIL PROCEDURE;  Surgeon: Erline Levine, MD;  Location: [place] [facility];  Service: Neurosurgery;  Laterality: N/A;  . TONSILLECTOMY      Past Medical History:  Diagnosis Date  . Adenomyosis   . Allergy    YEAR AROUND ALLERGIES  . Anemia   . Anxiety    situational per pt.  . Arthritis   . GERD (gastroesophageal reflux disease)   . Hemorrhoids   . Hiatal hernia   . Hyperlipidemia    borderline, controlled with diet per pt.  . IBS (irritable bowel syndrome)   .  Seasonal allergies   . Tubular adenoma of colon 04/2011    Allergies: Codeine  Current Outpatient Medications  Medication Sig Dispense Refill  . Fiber CHEW Chew 1 tablet by mouth daily.     Marland Kitchen KRILL OIL PO Take 1 capsule by mouth daily.     . Nutritional Supplements (JUICE PLUS FIBRE PO) Take 12 each by mouth daily.    Marland Kitchen omeprazole (PRILOSEC) 20 MG capsule Take 1 capsule (20 mg total) by mouth daily. 30 capsule 11  . Plant Stanol Ester (BENECOL PO) Take 4 tablets by mouth daily.    . Probiotic Product (DIGESTIVE ADVANTAGE PO) Take 1 tablet by mouth daily.    Marland Kitchen VITAMIN D, ERGOCALCIFEROL, PO Take 1,000 Units by mouth daily.      No current facility-administered medications for this visit.     ROS: A comprehensive review of systems was negative except for: Genitourinary: positive for urinary incontinence  Exam:    BP 136/70 (BP Location: Right Arm, Patient Position: Sitting, Cuff Size: Normal)   Pulse 96   Resp 14   Wt 128 lb (58.1 kg)   LMP 08/21/2017 (Approximate)   BMI 22.67 kg/m   General appearance: alert and cooperative Head: Normocephalic, without obvious abnormality, atraumatic Neck: no adenopathy, supple, symmetrical, trachea midline and thyroid not enlarged, symmetric, no tenderness/mass/nodules Lungs: clear to auscultation bilaterally Heart: regular rate and rhythm, S1, S2 normal, no murmur, click, rub or gallop Abdomen: soft, non-tender; bowel sounds normal; no masses,  no organomegaly Extremities: extremities normal, atraumatic, no cyanosis or edema Skin: Skin color, texture,  turgor normal. No rashes or lesions Lymph nodes: Cervical, supraclavicular, and axillary nodes normal. no inguinal nodes palpated Neurologic: Grossly normal  Pelvic: External genitalia:  no lesions              Urethra: normal appearing urethra with no masses, tenderness or lesions              Bartholins and Skenes: Bartholin's, Urethra, Skene's normal                 Vagina: normal  appearing vagina with normal color and discharge, no lesions              Cervix: normal appearance              Pap taken: No.        Bimanual Exam:  Uterus:  uterus is normal size, shape, consistency and nontender                                      Adnexa:    normal adnexa in size, nontender and no masses                                      Rectovaginal: Deferred                                      Anus:  normal sphincter tone, no lesions  Endometrial biopsy recommended.  Discussed with patient.  Verbal and written consent obtained.   Procedure:  Speculum placed.  Cervix visualized and cleansed with betadine prep.  A single toothed tenaculum was applied to the anterior lip of the cervix.  Endometrial pipelle was advanced through the cervix into the endometrial cavity without difficulty.  Pipelle passed to 7cm.  Suction applied and pipelle removed with good tissue sample obtained.  Tenculum removed.  No bleeding noted.  Patient tolerated procedure well.  A: Pelvic Pain  Ovarian cyst Endometrial polyps, thickened endometrium     P:  TLH/BSO, cystoscopy planned Estrace cream externally nightly Medications/Vitamins reviewed. Hysterectomy brochure given for pre and post op instructions.

## 2017-12-26 NOTE — Addendum Note (Signed)
Addended by: Polly Cobia on: 12/26/2017 03:11 PM   Modules accepted: Orders

## 2017-12-27 LAB — VAGINITIS/VAGINOSIS, DNA PROBE
CANDIDA SPECIES: NEGATIVE
GARDNERELLA VAGINALIS: POSITIVE — AB
TRICHOMONAS VAG: NEGATIVE

## 2017-12-29 ENCOUNTER — Other Ambulatory Visit: Payer: Self-pay | Admitting: *Deleted

## 2017-12-29 MED ORDER — METRONIDAZOLE 500 MG PO TABS: 500.0000 mg | ORAL_TABLET | Freq: Two times a day (BID) | ORAL | 0 refills | Status: DC

## 2017-12-31 ENCOUNTER — Telehealth: Payer: Self-pay | Admitting: *Deleted

## 2017-12-31 MED ORDER — MISOPROSTOL 200 MCG PO TABS: ORAL_TABLET | ORAL | 0 refills | Status: DC

## 2017-12-31 NOTE — Telephone Encounter (Signed)
Call patient to review biospy results. Left message to call back.

## 2017-12-31 NOTE — Telephone Encounter (Signed)
-----   Message from Megan Salon, MD sent at 12/31/2017  6:33 AM EDT ----- Gay Filler,  Would you call this pt and let her know that the biopsy was negative but no endometrial tissue was present in the biopsy.  Needs to be repeated.  As surgery is scheduled for May 25th, probably needs to be repeated tomorrow or Friday.  Would recommend using cytotec pm and am before procedure to help with cervical dilation.  287mcg pv dosing.  I think tomorrow might work better than Friday.  Thanks.

## 2017-12-31 NOTE — Telephone Encounter (Signed)
Call to patient. Advised of results as directed by Dr Sabra Heck.  Appointment for repeat biopsy scheduled for 01-01-18 at 4:15pm. Instructions given.  Encounter closed.

## 2018-01-01 ENCOUNTER — Telehealth: Payer: Self-pay | Admitting: Obstetrics & Gynecology

## 2018-01-01 ENCOUNTER — Encounter: Payer: Self-pay | Admitting: Obstetrics & Gynecology

## 2018-01-01 ENCOUNTER — Ambulatory Visit (INDEPENDENT_AMBULATORY_CARE_PROVIDER_SITE_OTHER): Payer: BLUE CROSS/BLUE SHIELD | Admitting: Obstetrics & Gynecology

## 2018-01-01 VITALS — BP 138/80 | HR 88 | Resp 16

## 2018-01-01 DIAGNOSIS — R2231 Localized swelling, mass and lump, right upper limb: Secondary | ICD-10-CM

## 2018-01-01 DIAGNOSIS — N840 Polyp of corpus uteri: Secondary | ICD-10-CM | POA: Diagnosis not present

## 2018-01-01 DIAGNOSIS — R9389 Abnormal findings on diagnostic imaging of other specified body structures: Secondary | ICD-10-CM

## 2018-01-01 NOTE — Telephone Encounter (Signed)
Patient has an endometrial biopsy scheduled this afternoon and has significant bleeding. Patient is asking to talk with a nurse.

## 2018-01-01 NOTE — Telephone Encounter (Signed)
Reviewed with Dr. Sabra Heck, call returned to patient. Advised to keep appointment as scheduled for today at 4:15pm for EMB. Reminded patient of Motrin 800 mg with food and water one hour before procedure.  Routing to provider for final review. Patient is agreeable to disposition. Will close encounter.

## 2018-01-01 NOTE — Progress Notes (Signed)
GYNECOLOGY  VISIT  CC:   Repeat endometrial biopsy  HPI: 57 y.o. G2P2 Married Caucasian female here for repeat endometrial biopsy due to probable endometrial polyp(s) with thickened endometrium.  Pt has decided to proceed with hysterectomy.  She underwent a biopsy last week but there was insufficient endometrial tissue.  She did use cytotec last night and this morning.  Has experienced some pelvic cramping and vaginal bleeding with the cytotec.  Also has an axillary mass she's felt that she would like me to evaluated today as well.  Is tender.  Had last MMG 12/12/17 which was normal.  GYNECOLOGIC HISTORY: No LMP recorded. Patient is not currently having periods (Reason: Perimenopausal). Contraception: post menopausal  Menopausal hormone therapy: none  Patient Active Problem List   Diagnosis Date Noted  . Endometriosis of uterus 10/29/2017  . Spondylolysis of lumbosacral region 02/01/2014    Past Medical History:  Diagnosis Date  . Adenomyosis   . Allergy    YEAR AROUND ALLERGIES  . Anemia   . Anxiety    situational per pt.  . Arthritis   . GERD (gastroesophageal reflux disease)   . Hemorrhoids   . Hiatal hernia   . Hyperlipidemia    borderline, controlled with diet per pt.  . IBS (irritable bowel syndrome)   . Seasonal allergies   . Tubular adenoma of colon 04/2011    Past Surgical History:  Procedure Laterality Date  . COLONOSCOPY    . MAXIMUM ACCESS (MAS)POSTERIOR LUMBAR INTERBODY FUSION (PLIF) 1 LEVEL N/A 02/01/2014   Procedure: LUMBAR FIVE-SACFAL ONE POSTERIOR LUMBAR INTERBODY FUSION,GIL PROCEDURE;  Surgeon: Erline Levine, MD;  Location: [facility];  Service: Neurosurgery;  Laterality: N/A;  . TONSILLECTOMY      MEDS:   Current Outpatient Medications on File Prior to Visit  Medication Sig Dispense Refill  . Fiber CHEW Chew 1 tablet by mouth daily.     Marland Kitchen KRILL OIL PO Take 1 capsule by mouth daily.     . metroNIDAZOLE (FLAGYL) 500 MG tablet Take 1 tablet (500 mg  total) by mouth 2 (two) times daily. 14 tablet 0  . Nutritional Supplements (JUICE PLUS FIBRE PO) Take 12 each by mouth daily.    Marland Kitchen omeprazole (PRILOSEC) 20 MG capsule Take 1 capsule (20 mg total) by mouth daily. 30 capsule 11  . Plant Stanol Ester (BENECOL PO) Take 4 tablets by mouth daily.    . Probiotic Product (DIGESTIVE ADVANTAGE PO) Take 1 tablet by mouth daily.    Marland Kitchen VITAMIN D, ERGOCALCIFEROL, PO Take 1,000 Units by mouth daily.      No current facility-administered medications on file prior to visit.     ALLERGIES: Codeine  Family History  Problem Relation Age of Onset  . Colon cancer Paternal Grandfather   . Diabetes Father   . Colon polyps Brother   . Breast cancer Cousin 75  . Diabetes Mother   . Hypertension Mother   . Hypertension Sister     SH:  Married, non smoker  Review of Systems  All other systems reviewed and are negative.   PHYSICAL EXAMINATION:    BP 138/80 (BP Location: Right Arm, Patient Position: Sitting, Cuff Size: Normal)   Pulse 88   Resp 16     Physical Exam  Constitutional: She is oriented to person, place, and time. She appears well-developed and well-nourished.  Respiratory:    Genitourinary: Vagina normal. There is no rash, tenderness, lesion or injury on the right labia. There is no rash,  tenderness, lesion or injury on the left labia.  Lymphadenopathy:       Right: No inguinal adenopathy present.       Left: No inguinal adenopathy present.  Neurological: She is alert and oriented to person, place, and time.  Skin: Skin is warm and dry.  Psychiatric: She has a normal mood and affect.   Endometrial biopsy recommended.  Discussed with patient.  Verbal and written consent obtained.   Procedure:  Speculum placed.  Cervix visualized and cleansed with betadine prep.  A single toothed tenaculum was applied to the anterior lip of the cervix.  Endometrial pipelle was advanced through the cervix into the endometrial cavity without difficulty.   Pipelle passed to 8cm.  Suction applied and pipelle removed with good tissue sample obtained.  Two passes were performed. Tenculum removed.  No bleeding noted.  Patient tolerated procedure well.  Chaperone was present for exam.  Assessment: Thickened endometrium, endometrial polyp(s) Small axillary mass, lymph node vs occluded gland  Plan: Biopsy results will be called to pt Pt will use some hot compresses and monitory axillary finding.  Due to size, feel monitoring is appropriate management at this time.

## 2018-01-01 NOTE — Telephone Encounter (Signed)
Spoke with patient. Scheduled for EMB today at 4:15pm with Dr. Sabra Heck. Cytotec vaginally last night, up all night d/t cramping, home from work today d/t cramping and bleeding.   Reports very little blood on pad, "heavy" when voiding and wiping.   Asking if ok to keep EMB as scheduled and is cramping normal?   Advised to keep appointment as scheduled, is normal to experience some cramping and spotting with cytotec. Will review with Dr. Sabra Heck and return call with any additional recommendations.   Dr. Sabra Heck, ok to keep EMB as scheduled?

## 2018-01-02 ENCOUNTER — Other Ambulatory Visit: Payer: Self-pay

## 2018-01-02 ENCOUNTER — Encounter (HOSPITAL_COMMUNITY): Payer: Self-pay

## 2018-01-02 ENCOUNTER — Encounter (HOSPITAL_COMMUNITY)
Admission: RE | Admit: 2018-01-02 | Discharge: 2018-01-02 | Disposition: A | Discharge status: Home / Self Care | Payer: BLUE CROSS/BLUE SHIELD | Source: Ambulatory Visit | Attending: Obstetrics & Gynecology | Admitting: Obstetrics & Gynecology

## 2018-01-02 DIAGNOSIS — Z01812 Encounter for preprocedural laboratory examination: Secondary | ICD-10-CM | POA: Diagnosis not present

## 2018-01-02 HISTORY — DX: Other bursitis of hip, unspecified hip: M70.70

## 2018-01-02 HISTORY — DX: Other vitreous opacities, bilateral: H43.393

## 2018-01-02 LAB — CBC
HCT: 41.7 % (ref 36.0–46.0)
Hemoglobin: 13.7 g/dL (ref 12.0–15.0)
MCH: 29 pg (ref 26.0–34.0)
MCHC: 32.9 g/dL (ref 30.0–36.0)
MCV: 88.2 fL (ref 78.0–100.0)
Platelets: 232 10*3/uL (ref 150–400)
RBC: 4.73 MIL/uL (ref 3.87–5.11)
RDW: 12.6 % (ref 11.5–15.5)
WBC: 5 10*3/uL (ref 4.0–10.5)

## 2018-01-02 LAB — BASIC METABOLIC PANEL
ANION GAP: 9 (ref 5–15)
BUN: 14 mg/dL (ref 6–20)
CALCIUM: 9.5 mg/dL (ref 8.9–10.3)
CO2: 24 mmol/L (ref 22–32)
Chloride: 104 mmol/L (ref 101–111)
Creatinine, Ser: 0.72 mg/dL (ref 0.44–1.00)
GFR calc Af Amer: >60 mL/min (ref >60)
GLUCOSE: 114 mg/dL — AB (ref 65–99)
Potassium: 4 mmol/L (ref 3.5–5.1)
Sodium: 137 mmol/L (ref 135–145)

## 2018-01-08 ENCOUNTER — Telehealth: Payer: Self-pay | Admitting: Obstetrics & Gynecology

## 2018-01-08 NOTE — Telephone Encounter (Signed)
Patient is scheduled for surgery Monday and wondering if she can take some over the counter medicine like claritin for sinus issues.

## 2018-01-08 NOTE — Telephone Encounter (Signed)
Spoke with patient. Patient states that she has seasonal allergies and is asking if she can take Claritin for relief. Advised okay to take Claritin after review with Lamont Snowball, RN. Advised not to take any products containing Ibuprofen, Motrin, or Advil. Patient is agreeable. Advised will review with Dr.Miller and return call with any additional recommendations.

## 2018-01-08 NOTE — Telephone Encounter (Signed)
Agree with recommendations.  Ok to close encounter.  Thanks.

## 2018-01-09 DIAGNOSIS — R590 Localized enlarged lymph nodes: Secondary | ICD-10-CM | POA: Diagnosis not present

## 2018-01-09 DIAGNOSIS — D171 Benign lipomatous neoplasm of skin and subcutaneous tissue of trunk: Secondary | ICD-10-CM | POA: Diagnosis not present

## 2018-01-11 ENCOUNTER — Encounter (HOSPITAL_COMMUNITY): Payer: Self-pay | Admitting: Anesthesiology

## 2018-01-11 NOTE — Anesthesia Preprocedure Evaluation (Addendum)
Anesthesia Evaluation  Patient identified by MRN, date of birth, ID band Patient awake    Reviewed: Allergy & Precautions, NPO status , Patient's Chart, lab work & pertinent test results  Airway Mallampati: II  TM Distance: >3 FB Neck ROM: Full    Dental  (+) Teeth Intact   Pulmonary neg pulmonary ROS,    Pulmonary exam normal breath sounds clear to auscultation       Cardiovascular negative cardio ROS Normal cardiovascular exam Rhythm:Regular Rate:Normal     Neuro/Psych Anxiety negative neurological ROS     GI/Hepatic Neg liver ROS, hiatal hernia, GERD  Medicated and Controlled,IBS   Endo/Other  Hyperlipidemia  Renal/GU negative Renal ROS  negative genitourinary   Musculoskeletal   Abdominal (+) - obese,   Peds  Hematology  (+) anemia ,   Anesthesia Other Findings   Reproductive/Obstetrics Pelvic pain Endometrial Polyp Adenomyosis                            Anesthesia Physical Anesthesia Plan  ASA: II  Anesthesia Plan: General   Post-op Pain Management:    Induction: Intravenous  PONV Risk Score and Plan: 4 or greater and Scopolamine patch - Pre-op, Midazolam, Dexamethasone, Ondansetron and Treatment may vary due to age or medical condition  Airway Management Planned: Oral ETT  Additional Equipment:   Intra-op Plan:   Post-operative Plan: Extubation in OR  Informed Consent: I have reviewed the patients History and Physical, chart, labs and discussed the procedure including the risks, benefits and alternatives for the proposed anesthesia with the patient or authorized representative who has indicated his/her understanding and acceptance.   Dental advisory given  Plan Discussed with: CRNA, Anesthesiologist and Surgeon  Anesthesia Plan Comments:        Anesthesia Quick Evaluation

## 2018-01-12 ENCOUNTER — Ambulatory Visit (HOSPITAL_COMMUNITY)
Admission: RE | Admit: 2018-01-12 | Discharge: 2018-01-13 | Disposition: A | Discharge status: Home / Self Care | Payer: BLUE CROSS/BLUE SHIELD | Source: Ambulatory Visit | Attending: Obstetrics & Gynecology | Admitting: Obstetrics & Gynecology

## 2018-01-12 ENCOUNTER — Encounter (HOSPITAL_COMMUNITY): Payer: Self-pay

## 2018-01-12 ENCOUNTER — Ambulatory Visit (HOSPITAL_COMMUNITY): Payer: BLUE CROSS/BLUE SHIELD | Admitting: Anesthesiology

## 2018-01-12 ENCOUNTER — Encounter (HOSPITAL_COMMUNITY)
Admission: RE | Disposition: A | Discharge status: Home / Self Care | Payer: Self-pay | Source: Ambulatory Visit | Attending: Obstetrics & Gynecology

## 2018-01-12 ENCOUNTER — Other Ambulatory Visit: Payer: Self-pay

## 2018-01-12 DIAGNOSIS — Z79899 Other long term (current) drug therapy: Secondary | ICD-10-CM | POA: Diagnosis not present

## 2018-01-12 DIAGNOSIS — D251 Intramural leiomyoma of uterus: Secondary | ICD-10-CM | POA: Insufficient documentation

## 2018-01-12 DIAGNOSIS — N840 Polyp of corpus uteri: Secondary | ICD-10-CM | POA: Diagnosis not present

## 2018-01-12 DIAGNOSIS — R102 Pelvic and perineal pain: Secondary | ICD-10-CM | POA: Diagnosis present

## 2018-01-12 DIAGNOSIS — K219 Gastro-esophageal reflux disease without esophagitis: Secondary | ICD-10-CM | POA: Diagnosis not present

## 2018-01-12 DIAGNOSIS — K589 Irritable bowel syndrome without diarrhea: Secondary | ICD-10-CM | POA: Diagnosis not present

## 2018-01-12 DIAGNOSIS — G8929 Other chronic pain: Secondary | ICD-10-CM | POA: Insufficient documentation

## 2018-01-12 DIAGNOSIS — N72 Inflammatory disease of cervix uteri: Secondary | ICD-10-CM | POA: Insufficient documentation

## 2018-01-12 DIAGNOSIS — Z885 Allergy status to narcotic agent status: Secondary | ICD-10-CM | POA: Insufficient documentation

## 2018-01-12 DIAGNOSIS — F419 Anxiety disorder, unspecified: Secondary | ICD-10-CM | POA: Diagnosis not present

## 2018-01-12 DIAGNOSIS — Z981 Arthrodesis status: Secondary | ICD-10-CM | POA: Insufficient documentation

## 2018-01-12 DIAGNOSIS — N8000 Endometriosis of the uterus, unspecified: Secondary | ICD-10-CM

## 2018-01-12 DIAGNOSIS — R192 Visible peristalsis: Secondary | ICD-10-CM | POA: Diagnosis not present

## 2018-01-12 DIAGNOSIS — D259 Leiomyoma of uterus, unspecified: Secondary | ICD-10-CM | POA: Diagnosis not present

## 2018-01-12 DIAGNOSIS — N83202 Unspecified ovarian cyst, left side: Secondary | ICD-10-CM | POA: Insufficient documentation

## 2018-01-12 HISTORY — PX: TOTAL LAPAROSCOPIC HYSTERECTOMY WITH SALPINGECTOMY: SHX6742

## 2018-01-12 HISTORY — PX: CYSTOSCOPY: SHX5120

## 2018-01-12 LAB — PREGNANCY, URINE: Preg Test, Ur: NEGATIVE

## 2018-01-12 SURGERY — HYSTERECTOMY, TOTAL, LAPAROSCOPIC, WITH SALPINGECTOMY
Anesthesia: General | Site: Bladder

## 2018-01-12 MED ORDER — BUPIVACAINE HCL (PF) 0.25 % IJ SOLN
INTRAMUSCULAR | Status: AC
  Filled 2018-01-12: qty 30

## 2018-01-12 MED ORDER — KETOROLAC TROMETHAMINE 30 MG/ML IJ SOLN: 30.0000 mg | Freq: Four times a day (QID) | INTRAMUSCULAR | Status: DC

## 2018-01-12 MED ORDER — SUGAMMADEX SODIUM 200 MG/2ML IV SOLN
INTRAVENOUS | Status: AC
Start: 2018-01-12 — End: 2018-01-12
  Filled 2018-01-12: qty 2

## 2018-01-12 MED ORDER — SIMETHICONE 80 MG PO CHEW
80.0000 mg | CHEWABLE_TABLET | Freq: Four times a day (QID) | ORAL | Status: DC | PRN
  Administered 2018-01-12: 80 mg via ORAL
  Filled 2018-01-12: qty 1

## 2018-01-12 MED ORDER — KETOROLAC TROMETHAMINE 30 MG/ML IJ SOLN
INTRAMUSCULAR | Status: DC | PRN
  Administered 2018-01-12: 30 mg via INTRAVENOUS

## 2018-01-12 MED ORDER — SUGAMMADEX SODIUM 200 MG/2ML IV SOLN
INTRAVENOUS | Status: DC | PRN
  Administered 2018-01-12: 200 mg via INTRAVENOUS

## 2018-01-12 MED ORDER — KETOROLAC TROMETHAMINE 30 MG/ML IJ SOLN
30.0000 mg | Freq: Four times a day (QID) | INTRAMUSCULAR | Status: DC
  Administered 2018-01-12 – 2018-01-13 (×4): 30 mg via INTRAVENOUS
  Filled 2018-01-12 (×4): qty 1

## 2018-01-12 MED ORDER — PROPOFOL 10 MG/ML IV BOLUS
INTRAVENOUS | Status: AC
  Filled 2018-01-12: qty 20

## 2018-01-12 MED ORDER — MIDAZOLAM HCL 2 MG/2ML IJ SOLN
INTRAMUSCULAR | Status: AC
  Filled 2018-01-12: qty 2

## 2018-01-12 MED ORDER — SODIUM CHLORIDE 0.9 % IJ SOLN
INTRAMUSCULAR | Status: DC | PRN
Start: 2018-01-12 — End: 2018-01-12
  Administered 2018-01-12: 10 mL

## 2018-01-12 MED ORDER — MENTHOL 3 MG MT LOZG: 1.0000 | LOZENGE | OROMUCOSAL | Status: DC | PRN

## 2018-01-12 MED ORDER — HYDROMORPHONE HCL 1 MG/ML IJ SOLN
INTRAMUSCULAR | Status: DC | PRN
  Administered 2018-01-12: 1 mg via INTRAVENOUS

## 2018-01-12 MED ORDER — LIDOCAINE HCL (PF) 1 % IJ SOLN
INTRAMUSCULAR | Status: AC
Start: 2018-01-12 — End: 2018-01-12
  Filled 2018-01-12: qty 5

## 2018-01-12 MED ORDER — HYDROMORPHONE HCL 2 MG PO TABS: 1.0000 mg | ORAL_TABLET | ORAL | Status: DC | PRN

## 2018-01-12 MED ORDER — SODIUM CHLORIDE 0.9 % IJ SOLN
INTRAMUSCULAR | Status: AC
  Filled 2018-01-12: qty 50

## 2018-01-12 MED ORDER — SODIUM CHLORIDE 0.9 % IR SOLN
Status: DC | PRN
  Administered 2018-01-12: 3000 mL

## 2018-01-12 MED ORDER — CEFOTETAN DISODIUM-DEXTROSE 2-2.08 GM-%(50ML) IV SOLR
2.0000 g | INTRAVENOUS | Status: AC
  Administered 2018-01-12: 2 g via INTRAVENOUS

## 2018-01-12 MED ORDER — ROCURONIUM BROMIDE 100 MG/10ML IV SOLN
INTRAVENOUS | Status: DC | PRN
  Administered 2018-01-12 (×2): 10 mg via INTRAVENOUS
  Administered 2018-01-12: 40 mg via INTRAVENOUS

## 2018-01-12 MED ORDER — DEXTROSE-NACL 5-0.45 % IV SOLN
INTRAVENOUS | Status: DC
  Administered 2018-01-12: 11:00:00 via INTRAVENOUS

## 2018-01-12 MED ORDER — TRAMADOL HCL 50 MG PO TABS: 100.0000 mg | ORAL_TABLET | Freq: Four times a day (QID) | ORAL | Status: DC | PRN

## 2018-01-12 MED ORDER — FENTANYL CITRATE (PF) 100 MCG/2ML IJ SOLN
INTRAMUSCULAR | Status: DC | PRN
  Administered 2018-01-12 (×3): 50 ug via INTRAVENOUS
  Administered 2018-01-12: 100 ug via INTRAVENOUS

## 2018-01-12 MED ORDER — BUPIVACAINE HCL (PF) 0.25 % IJ SOLN
INTRAMUSCULAR | Status: DC | PRN
  Administered 2018-01-12: 10 mL

## 2018-01-12 MED ORDER — ROPIVACAINE HCL 5 MG/ML IJ SOLN
INTRAMUSCULAR | Status: AC
  Filled 2018-01-12: qty 30

## 2018-01-12 MED ORDER — HYDROMORPHONE HCL 1 MG/ML IJ SOLN
INTRAMUSCULAR | Status: AC
  Administered 2018-01-12: 0.5 mg via INTRAVENOUS
  Filled 2018-01-12: qty 1

## 2018-01-12 MED ORDER — METOCLOPRAMIDE HCL 5 MG/ML IJ SOLN: 10.0000 mg | Freq: Once | INTRAMUSCULAR | Status: DC | PRN

## 2018-01-12 MED ORDER — DEXAMETHASONE SODIUM PHOSPHATE 4 MG/ML IJ SOLN
INTRAMUSCULAR | Status: AC
  Filled 2018-01-12: qty 1

## 2018-01-12 MED ORDER — HYDROMORPHONE HCL 1 MG/ML IJ SOLN
0.2500 mg | INTRAMUSCULAR | Status: DC | PRN
  Administered 2018-01-12 (×2): 0.5 mg via INTRAVENOUS

## 2018-01-12 MED ORDER — HYDROMORPHONE HCL 1 MG/ML IJ SOLN: 0.5000 mg | INTRAMUSCULAR | Status: DC | PRN

## 2018-01-12 MED ORDER — PANTOPRAZOLE SODIUM 40 MG IV SOLR
40.0000 mg | Freq: Every day | INTRAVENOUS | Status: DC
  Administered 2018-01-12: 40 mg via INTRAVENOUS
  Filled 2018-01-12: qty 40

## 2018-01-12 MED ORDER — CEFOTETAN DISODIUM-DEXTROSE 2-2.08 GM-%(50ML) IV SOLR
INTRAVENOUS | Status: AC
  Filled 2018-01-12: qty 50

## 2018-01-12 MED ORDER — LIDOCAINE HCL (CARDIAC) 20 MG/ML IV SOLN
INTRAVENOUS | Status: DC | PRN
  Administered 2018-01-12: 50 mg via INTRAVENOUS

## 2018-01-12 MED ORDER — LACTATED RINGERS IV SOLN
INTRAVENOUS | Status: DC
  Administered 2018-01-12 (×3): via INTRAVENOUS

## 2018-01-12 MED ORDER — ENOXAPARIN SODIUM 40 MG/0.4ML ~~LOC~~ SOLN
40.0000 mg | SUBCUTANEOUS | Status: AC
  Administered 2018-01-12: 40 mg via SUBCUTANEOUS
  Filled 2018-01-12: qty 0.4

## 2018-01-12 MED ORDER — HYDROMORPHONE HCL 2 MG PO TABS: 2.0000 mg | ORAL_TABLET | ORAL | Status: DC | PRN

## 2018-01-12 MED ORDER — ACETAMINOPHEN 325 MG PO TABS: 650.0000 mg | ORAL_TABLET | ORAL | Status: DC | PRN

## 2018-01-12 MED ORDER — KETOROLAC TROMETHAMINE 30 MG/ML IJ SOLN
INTRAMUSCULAR | Status: AC
Start: 2018-01-12 — End: 2018-01-12
  Filled 2018-01-12: qty 1

## 2018-01-12 MED ORDER — SCOPOLAMINE 1 MG/3DAYS TD PT72
MEDICATED_PATCH | TRANSDERMAL | Status: AC
  Administered 2018-01-12: 1.5 mg via TRANSDERMAL
  Filled 2018-01-12: qty 1

## 2018-01-12 MED ORDER — ALUM & MAG HYDROXIDE-SIMETH 200-200-20 MG/5ML PO SUSP: 30.0000 mL | ORAL | Status: DC | PRN

## 2018-01-12 MED ORDER — SCOPOLAMINE 1 MG/3DAYS TD PT72
1.0000 | MEDICATED_PATCH | Freq: Once | TRANSDERMAL | Status: DC
  Administered 2018-01-12: 1.5 mg via TRANSDERMAL

## 2018-01-12 MED ORDER — LIDOCAINE HCL 1 % IJ SOLN
INTRAMUSCULAR | Status: AC
  Filled 2018-01-12: qty 20

## 2018-01-12 MED ORDER — ENOXAPARIN SODIUM 40 MG/0.4ML ~~LOC~~ SOLN
40.0000 mg | SUBCUTANEOUS | Status: DC
  Filled 2018-01-12: qty 0.4

## 2018-01-12 MED ORDER — MIDAZOLAM HCL 2 MG/2ML IJ SOLN
INTRAMUSCULAR | Status: DC | PRN
  Administered 2018-01-12: 2 mg via INTRAVENOUS

## 2018-01-12 MED ORDER — PROPOFOL 10 MG/ML IV BOLUS
INTRAVENOUS | Status: DC | PRN
  Administered 2018-01-12: 200 mg via INTRAVENOUS

## 2018-01-12 MED ORDER — SODIUM CHLORIDE 0.9 % IV SOLN
INTRAVENOUS | Status: DC | PRN
  Administered 2018-01-12: 60 mL

## 2018-01-12 MED ORDER — ONDANSETRON HCL 4 MG/2ML IJ SOLN
INTRAMUSCULAR | Status: DC | PRN
  Administered 2018-01-12: 4 mg via INTRAVENOUS

## 2018-01-12 MED ORDER — FENTANYL CITRATE (PF) 250 MCG/5ML IJ SOLN
INTRAMUSCULAR | Status: AC
  Filled 2018-01-12: qty 5

## 2018-01-12 MED ORDER — STERILE WATER FOR IRRIGATION IR SOLN
Status: DC | PRN
Start: 2018-01-12 — End: 2018-01-12
  Administered 2018-01-12: 1000 mL via INTRAVESICAL

## 2018-01-12 MED ORDER — HYDROMORPHONE HCL 1 MG/ML IJ SOLN
INTRAMUSCULAR | Status: AC
  Filled 2018-01-12: qty 1

## 2018-01-12 MED ORDER — ENOXAPARIN SODIUM 40 MG/0.4ML ~~LOC~~ SOLN
SUBCUTANEOUS | Status: AC
  Administered 2018-01-12: 40 mg via SUBCUTANEOUS
  Filled 2018-01-12: qty 0.4

## 2018-01-12 MED ORDER — ONDANSETRON HCL 4 MG/2ML IJ SOLN
INTRAMUSCULAR | Status: AC
  Filled 2018-01-12: qty 2

## 2018-01-12 MED ORDER — DEXAMETHASONE SODIUM PHOSPHATE 10 MG/ML IJ SOLN
INTRAMUSCULAR | Status: DC | PRN
  Administered 2018-01-12: 4 mg via INTRAVENOUS

## 2018-01-12 SURGICAL SUPPLY — 49 items
ADH SKN CLS APL DERMABOND .7 (GAUZE/BANDAGES/DRESSINGS) ×2
APL SRG 38 LTWT LNG FL B (MISCELLANEOUS) ×2
APPLICATOR ARISTA FLEXITIP XL (MISCELLANEOUS) ×1 IMPLANT
CABLE HIGH FREQUENCY MONO STRZ (ELECTRODE) ×3 IMPLANT
COVER LIGHT HANDLE  1/PK (MISCELLANEOUS) ×1
COVER LIGHT HANDLE 1/PK (MISCELLANEOUS) ×2 IMPLANT
COVER MAYO STAND STRL (DRAPES) ×3 IMPLANT
DERMABOND ADVANCED (GAUZE/BANDAGES/DRESSINGS) ×1
DERMABOND ADVANCED .7 DNX12 (GAUZE/BANDAGES/DRESSINGS) ×2 IMPLANT
DURAPREP 26ML APPLICATOR (WOUND CARE) ×3 IMPLANT
GLOVE BIOGEL PI IND STRL 7.0 (GLOVE) ×8 IMPLANT
GLOVE BIOGEL PI INDICATOR 7.0 (GLOVE) ×4
GLOVE ECLIPSE 6.5 STRL STRAW (GLOVE) ×6 IMPLANT
GOWN STRL REUS W/TWL LRG LVL3 (GOWN DISPOSABLE) ×12 IMPLANT
HEMOSTAT ARISTA ABSORB 3G PWDR (MISCELLANEOUS) ×1 IMPLANT
LIGASURE VESSEL 5MM BLUNT TIP (ELECTROSURGICAL) ×3 IMPLANT
NEEDLE INSUFFLATION 120MM (ENDOMECHANICALS) ×3 IMPLANT
NS IRRIG 1000ML POUR BTL (IV SOLUTION) ×3 IMPLANT
OCCLUDER COLPOPNEUMO (BALLOONS) ×3 IMPLANT
PACK LAPAROSCOPY BASIN (CUSTOM PROCEDURE TRAY) ×3 IMPLANT
PACK TRENDGUARD 450 HYBRID PRO (MISCELLANEOUS) IMPLANT
PACK TRENDGUARD 600 HYBRD PROC (MISCELLANEOUS) IMPLANT
POUCH LAPAROSCOPIC INSTRUMENT (MISCELLANEOUS) ×3 IMPLANT
PROTECTOR NERVE ULNAR (MISCELLANEOUS) ×6 IMPLANT
SCISSORS LAP 5X35 DISP (ENDOMECHANICALS) ×3 IMPLANT
SET CYSTO W/LG BORE CLAMP LF (SET/KITS/TRAYS/PACK) ×3 IMPLANT
SET IRRIG TUBING LAPAROSCOPIC (IRRIGATION / IRRIGATOR) ×3 IMPLANT
SET TRI-LUMEN FLTR TB AIRSEAL (TUBING) ×3 IMPLANT
SHEARS HARMONIC ACE PLUS 36CM (ENDOMECHANICALS) ×3 IMPLANT
SUT VIC AB 0 CT1 27 (SUTURE) ×6
SUT VIC AB 0 CT1 27XBRD ANBCTR (SUTURE) ×4 IMPLANT
SUT VICRYL 4-0 PS2 18IN ABS (SUTURE) ×6 IMPLANT
SUT VLOC 180 0 9IN  GS21 (SUTURE) ×1
SUT VLOC 180 0 9IN GS21 (SUTURE) ×2 IMPLANT
SYR 10ML LL (SYRINGE) ×3 IMPLANT
SYR 50ML LL SCALE MARK (SYRINGE) ×6 IMPLANT
TIP UTERINE 5.1X6CM LAV DISP (MISCELLANEOUS) IMPLANT
TIP UTERINE 6.7X10CM GRN DISP (MISCELLANEOUS) IMPLANT
TIP UTERINE 6.7X6CM WHT DISP (MISCELLANEOUS) IMPLANT
TIP UTERINE 6.7X8CM BLUE DISP (MISCELLANEOUS) ×1 IMPLANT
TOWEL OR 17X24 6PK STRL BLUE (TOWEL DISPOSABLE) ×6 IMPLANT
TRAY FOLEY CATH SILVER 14FR (SET/KITS/TRAYS/PACK) ×3 IMPLANT
TRENDGUARD 450 HYBRID PRO PACK (MISCELLANEOUS) ×3
TRENDGUARD 600 HYBRID PROC PK (MISCELLANEOUS)
TROCAR ADV FIXATION 5X100MM (TROCAR) ×3 IMPLANT
TROCAR PORT AIRSEAL 5X120 (TROCAR) ×3 IMPLANT
TROCAR XCEL NON BLADE 8MM B8LT (ENDOMECHANICALS) ×3 IMPLANT
TROCAR XCEL NON-BLD 5MMX100MML (ENDOMECHANICALS) ×3 IMPLANT
WARMER LAPAROSCOPE (MISCELLANEOUS) ×3 IMPLANT

## 2018-01-12 NOTE — Op Note (Addendum)
01/12/2018  9:32 AM  PATIENT:  Shelly Gilbert  57 y.o. female  PRE-OPERATIVE DIAGNOSIS:  pelvic pain, endometrial polyp, possible adenomyosis, uterine fibroids, left ovarian cyst  POST-OPERATIVE DIAGNOSIS:  pelvic pain, endometrial polyp, possible adenomyosis, uterine fibroids, left ovarian cyst, increased peristalsis of small bowel  PROCEDURE:  Procedure(s): TOTAL LAPAROSCOPIC HYSTERECTOMY WITH SALPINGECTOMY, BSO CYSTOSCOPY  SURGEON:  Megan Salon  ASSISTANTS: Josefa Half, MD   ANESTHESIA:   general  ESTIMATED BLOOD LOSS: 30 mL  BLOOD ADMINISTERED:none   FLUIDS: 1200cc LR  UOP: 125cc clear UOP  SPECIMEN:  Uterus, cervix, bilateral fallopian tubes and ovaries  DISPOSITION OF SPECIMEN:  PATHOLOGY  FINDINGS: uterine fibroids, hyperactive small bowel peristalsis, left ovarian cyst, no pelvic adhesions, area of small bowel adhesions on the right and above the umbilicus   DESCRIPTION OF OPERATION: Patient is taken to the operating room. She is placed in the supine position. She is a running IV in place. Informed consent was present on the chart. SCDs on her lower extremities and functioning properly. Patient was positioned while she was awake.  Her legs were placed in the low lithotomy position in [place]. Her arms were tucked by the side.  General endotracheal anesthesia was administered by the anesthesia staff without difficulty. Dr. Royce Macadamia, anesthesia, oversaw case.  Time out performed.    Chlora prep was then used to prep the abdomen and Betadine was used to prep the inner thighs, perineum and vagina. Once 3 minutes had past the patient was draped in a normal standard fashion. The legs were lifted to the high lithotomy position. The cervix was visualized by placing a heavy weighted speculum in the posterior aspect of the vagina and using a curved Deaver retractor to the retract anteriorly. The anterior lip of the cervix was grasped with single-tooth tenaculum.  The cervix  sounded to 8.5 cm. Pratt dilators were used to dilate the cervix up to a #21. A RUMI uterine manipulator was obtained. A #8 disposable tip was placed on the RUMI manipulator as well as a 3.5, silver KOH ring. This was passed through the cervix and the bulb of the disposable tip was inflated with 10 cc of normal saline. There was a good fit of the KOH ring around the cervix. The tenaculum was removed. There is also good manipulation of the uterus. The speculum and retractor were removed as well. A Foley catheter was placed to straight drain.  Clear urine was noted. Legs were lowered to the low lithotomy position and attention was turned the abdomen.  The umbilicus was everted.  A Veress needle was obtained. Syringe of sterile saline was placed on a open Veress needle.  This was passed into the umbilicus until just when the fluid started to drip.  Then low flow CO2 gas was attached the needle and the pneumoperitoneum was achieved without difficulty. Once four liters of gas was in the abdomen the Veress needle was removed and a 5 millimeter non-bladed Optiview trocar and port were passed directly to the abdomen. The laparoscope was then used to confirm intraperitoneal placement. A mildly enlarged uterus with fundal fibroid was noted.   Ovaries were normal except for the cyst on the left ovary.  Upper abdomen appeared normal.  Significant peristalsis of the small bowel was noted.  This appeared almost hyperactive.  Bowel appeared normal otherwise.  Locations for RLQ, LLQ, and suprapubic ports were noted by transillumination of the abdominal wall.  0.25% marcaine was used to anesthetize the skin.  9mm skin incision was made in the RLQ and an AirSeal port was placed underdirect visualization of the laparoscope.  Then a 58mm skin incision was made and a 39mm nonbladed trochar and port was placed in the LLQ.  Finally, and 73mm skin incision was made about 4cm above the pubic symphasis and an 25mm non-bladed port was placed  with direct visualization of the laparoscope.  All trochars were removed.    Ureters were identified.  Attention was turned to the left side. With uterus on stretch the left IP ligament was clamped, cauterized and incised with the Ligasure device.  Then the left round ligament was serially clamped cauterized and incised. The anterior and posterior peritoneum of the inferior leaf of the broad ligament were opened. The beginning of the baldder flap was created.  The bladder was taken down below the level of the KOH ring. The left uterine artery skeletonized and then just superior to the KOH ring this vessel was serially clamped, cauterized, and incised.  Attention was turned the right side.  The uterus was placed on stretch to the opposite side.  The right IP ligament was then clamped, cauterized and incised using the Ligasure device.  Next the right round ligament was serially clamped cauterized and incised. The anterior posterior peritoneum of the inferiorly for the broad ligament were opened. The anterior peritoneum was carried across to the dissection on the left side. The remainder of the bladder flap was created using sharp dissection. The bladder was well below the level of the KOH ring. The left uterine artery skeletonized. Then the left uterine artery, above the level of the KOH ring, was serially clamped cauterized and incised. The uterus was devascularized at this point.  The colpotomy was performed a starting in the midline and using a harmonic scalpel with the inferior edge of the open blade  This was carried around a circumferential fashion until the vaginal mucosa was completely incised in the specimen was freed.  The specimen was then delivered to the vagina.  A vaginal occlusive device was used to maintain the pneumoperitoneum  Instruments were changed with a needle driver and Kobra graspers.  Using a 9 inch V. lock suture, the cuff was closed by incorporating the anterior and posterior  vaginal mucosa in each stitch. This was carried across all the way to the left corner and a running fashion. Two stitches were brought back towards the midline and the suture was cut flush with the vagina. The needle was brought out the pelvis. The pelvis was irrigated. All pedicles were inspected. No bleeding was noted. CO2 gas pressures were decreased and pedicles were inspected.  No bleeding was noted.    Cystoscopy was then performed.  Foley was removed first before insertion of the cystoscopy.  The entire bladder was visualized.  No sutures or injury was noted.  The typical bubble was noted at the dome of the bladder.  The ureteral orifices were patent and normal urine jets were noted.  Cytoscopic fluid was drained and foley was left out.    At this point the procedure was completed.  Pressure was decreased and all pedicles inspected again.  Arista was placed along all pedicles.  Then the LLQ and suprapubic ports were removed under direct visualization of the laparoscope and the pneumoperitoneum was relieved.  The patient was taken out of Trendelenburg positioning.  Several deep breaths were given to the patient's trying to any gas the abdomen and finally the midline and RLQ ports were  removed.  The skin was then closed with subcuticular stitches of 3-0 Vicryl. The skin was cleansed Dermabond was applied. Attention was then turned the vagina and the cuff was inspected. No bleeding was noted. The anterior posterior vaginal mucosa was incorporated in each stitch.   Sponge, lap, needle, initially counts were correct x2. Patient tolerated the procedure very well. She was awakened from anesthesia, extubated and taken to recovery in stable condition.   COUNTS:  YES  PLAN OF CARE: Transfer to PACU

## 2018-01-12 NOTE — Transfer of Care (Signed)
Immediate Anesthesia Transfer of Care Note  Patient: Shelly Gilbert  Procedure(s) Performed: TOTAL LAPAROSCOPIC HYSTERECTOMY WITH SALPINGECTOMY Possible BSO (Bilateral Abdomen) CYSTOSCOPY (N/A Bladder)  Patient Location: PACU  Anesthesia Type:General  Level of Consciousness: awake, alert  and oriented  Airway & Oxygen Therapy: Patient Spontanous Breathing and Patient connected to nasal cannula oxygen  Post-op Assessment: Report given to RN, Post -op Vital signs reviewed and stable and Patient moving all extremities X 4  Post vital signs: Reviewed and stable  Last Vitals:  Vitals Value Taken Time  BP 131/83 01/12/2018  9:45 AM  Temp    Pulse 100 01/12/2018  9:48 AM  Resp 10 01/12/2018  9:48 AM  SpO2 100 % 01/12/2018  9:48 AM  Vitals shown include unvalidated device data.  Last Pain:  Vitals:   01/12/18 0552  TempSrc: Oral  PainSc: 3       Patients Stated Pain Goal: 3 (66/06/30 1601)  Complications: No apparent anesthesia complications

## 2018-01-12 NOTE — H&P (Signed)
Shelly Gilbert is an 57 y.o. female G2P2 MWF here for definitive treatment of PMP bleeding, endometrial polyps, ovarian cyst, chronic pelvic pain.  She desires both tubes and ovaries to be removed with hysterectomy as well.  Risks, benefits, alteratives have been discussed.  She is here and ready to proceed.  Ultrasound and endometrial biopsy have been performed in the evaluation process.  Pertinent Gynecological History: Menses: post-menopausal Bleeding: post menopausal bleeding Contraception: vasectomy DES exposure: denies Blood transfusions: none Sexually transmitted diseases: no past history Previous GYN Procedures: none  Last mammogram: normal Date: 2/19 Last pap: normal Date: 3/18 ASCUS with neg HR HPV OB History: G2, P2   Menstrual History: Patient's last menstrual period was 12/31/2017 (approximate).    Past Medical History:  Diagnosis Date  . Adenomyosis   . Allergy    YEAR AROUND ALLERGIES  . Anemia   . Anxiety    situational per pt.  . Bursitis of hip    left  . Floaters, bilateral   . GERD (gastroesophageal reflux disease)   . Hemorrhoids   . Hiatal hernia   . Hyperlipidemia    borderline, controlled with diet per pt.  . IBS (irritable bowel syndrome)   . Seasonal allergies   . SVD (spontaneous vaginal delivery)    x 2  . Tubular adenoma of colon 04/2011    Past Surgical History:  Procedure Laterality Date  . COLONOSCOPY     polyps  . MAXIMUM ACCESS (MAS)POSTERIOR LUMBAR INTERBODY FUSION (PLIF) 1 LEVEL N/A 02/01/2014   Procedure: LUMBAR FIVE-SACFAL ONE POSTERIOR LUMBAR INTERBODY FUSION,GIL PROCEDURE;  Surgeon: Erline Levine, MD;  Location: [facility];  Service: Neurosurgery;  Laterality: N/A;  . TONSILLECTOMY    . UPPER GI ENDOSCOPY     reflux  . WISDOM TOOTH EXTRACTION      Family History  Problem Relation Age of Onset  . Colon cancer Paternal Grandfather   . Diabetes Father   . Colon polyps Brother   . Breast cancer Cousin 15  . Diabetes Mother    . Hypertension Mother   . Hypertension Sister     Social History:  reports that she has never smoked. She has never used smokeless tobacco. She reports that she drinks alcohol. She reports that she does not use drugs.  Allergies:  Allergies  Allergen Reactions  . Codeine Other (See Comments)    Severe nightmares    Medications Prior to Admission  Medication Sig Dispense Refill Last Dose  . Fiber CHEW Chew 1 tablet by mouth daily.    Past Week at Unknown time  . KRILL OIL PO Take 1 capsule by mouth daily.    Past Week at Unknown time  . metroNIDAZOLE (FLAGYL) 500 MG tablet Take 1 tablet (500 mg total) by mouth 2 (two) times daily. 14 tablet 0 Past Week at Unknown time  . Nutritional Supplements (JUICE PLUS FIBRE PO) Take 12 each by mouth daily.   Past Week at Unknown time  . omeprazole (PRILOSEC) 20 MG capsule Take 1 capsule (20 mg total) by mouth daily. 30 capsule 11 01/12/2018 at 0445  . Plant Stanol Ester (BENECOL PO) Take 4 tablets by mouth at bedtime.    Past Week at Unknown time  . Probiotic Product (DIGESTIVE ADVANTAGE PO) Take 1 tablet by mouth daily.   Past Week at Unknown time  . VITAMIN D, ERGOCALCIFEROL, PO Take 1,000 Units by mouth daily.    Past Week at Unknown time    Review of  Systems  All other systems reviewed and are negative.   Blood pressure (!) 157/84, pulse 95, temperature (!) 97.3 F (36.3 C), temperature source Oral, resp. rate 16, last menstrual period 12/31/2017, SpO2 99 %. Physical Exam  Constitutional: She is oriented to person, place, and time. She appears well-developed and well-nourished.  Cardiovascular: Normal rate and regular rhythm.  Respiratory: Effort normal and breath sounds normal.  GI: Soft. Bowel sounds are normal.  Neurological: She is alert and oriented to person, place, and time.  Skin: Skin is warm and dry.  Psychiatric: She has a normal mood and affect.    Results for orders placed or performed during the hospital encounter of  01/12/18 (from the past 24 hour(s))  Pregnancy, urine     Status: None   Collection Time: 01/12/18  6:00 AM  Result Value Ref Range   Preg Test, Ur NEGATIVE NEGATIVE    No results found.  Assessment/Plan: 57 yo G2P2 MWF here for definitive treatment of PMP bleeding, endometrial polyp, chronic pelvic pain with TLH/BSO/cystoscopy.  Questions answered.  Pt here and ready to proceed.  Megan Salon 01/12/2018, 6:59 AM

## 2018-01-12 NOTE — Progress Notes (Signed)
Day of Surgery Procedure(s) (LRB): TOTAL LAPAROSCOPIC HYSTERECTOMY WITH SALPINGECTOMY Possible BSO (Bilateral) CYSTOSCOPY (N/A)  Subjective: Patient reports good pain control.  Having a lot of upper abdomen bloating sensation.  Not sure if this is GI (as she does have issues like this from time to time) or related to gas from laparoscopy.  No nausea.  Has eaten.  Voiding well.  IV has been saline locked.  Walking without assistance.    Objective: I have reviewed patient's vital signs, intake and output, medications and labs. Vitals:   01/12/18 1100 01/12/18 1200  BP: (!) 146/64 (!) 145/78  Pulse: 81 77  Resp: 16 16  Temp: 98.8 F (37.1 C) 97.8 F (36.6 C)  SpO2: 99% 97%   UOP: 2275cc  General: alert, cooperative and no distress Resp: clear to auscultation bilaterally Cardio: regular rate and rhythm, S1, S2 normal, no murmur, click, rub or gallop GI: soft, non-tender; bowel sounds normal; no masses,  no organomegaly and incision: clean, dry, intact and significant bruising noted at each incision site Extremities: extremities normal, atraumatic, no cyanosis or edema Vaginal Bleeding: very minimal spotting noted on pad  Assessment: s/p Procedure(s) with comments: TOTAL LAPAROSCOPIC HYSTERECTOMY WITH SALPINGECTOMY Possible BSO (Bilateral) - possible BSO CYSTOSCOPY (N/A): stable and progressing well  Plan: Advance diet Encourage ambulation Advance to PO medication kpad ordered  CBC in AM  LOS: 0 days    Megan Salon 01/12/2018, 6:28 PM

## 2018-01-12 NOTE — Anesthesia Procedure Notes (Signed)
Procedure Name: Intubation Date/Time: 01/12/2018 7:35 AM Performed by: Bufford Spikes, CRNA Pre-anesthesia Checklist: Patient identified, Emergency Drugs available, Suction available and Patient being monitored Patient Re-evaluated:Patient Re-evaluated prior to induction Oxygen Delivery Method: Circle system utilized Preoxygenation: Pre-oxygenation with 100% oxygen Induction Type: IV induction Ventilation: Mask ventilation without difficulty Laryngoscope Size: Miller and 2 Grade View: Grade II Tube type: Oral Tube size: 7.0 mm Number of attempts: 1 Airway Equipment and Method: Stylet Placement Confirmation: ETT inserted through vocal cords under direct vision,  positive ETCO2 and breath sounds checked- equal and bilateral Secured at: 22 cm Tube secured with: Tape Dental Injury: Teeth and Oropharynx as per pre-operative assessment

## 2018-01-12 NOTE — Anesthesia Postprocedure Evaluation (Signed)
Anesthesia Post Note  Patient: Shelly Gilbert  Procedure(s) Performed: TOTAL LAPAROSCOPIC HYSTERECTOMY WITH SALPINGECTOMY Possible BSO (Bilateral Abdomen) CYSTOSCOPY (N/A Bladder)     Patient location during evaluation: PACU Anesthesia Type: General Level of consciousness: awake and alert Pain management: pain level controlled Vital Signs Assessment: post-procedure vital signs reviewed and stable Respiratory status: spontaneous breathing, nonlabored ventilation, respiratory function stable and patient connected to nasal cannula oxygen Cardiovascular status: blood pressure returned to baseline and stable Postop Assessment: no apparent nausea or vomiting Anesthetic complications: no    Last Vitals:  Vitals:   01/12/18 1030 01/12/18 1045  BP: 140/76 140/71  Pulse: 77 85  Resp: (!) 9 12  Temp:    SpO2: 100% 100%    Last Pain:  Vitals:   01/12/18 1045  TempSrc:   PainSc: 2    Pain Goal: Patients Stated Pain Goal: 3 (01/12/18 1000)               [name]

## 2018-01-13 ENCOUNTER — Ambulatory Visit: Payer: BLUE CROSS/BLUE SHIELD | Admitting: Gastroenterology

## 2018-01-13 ENCOUNTER — Encounter (HOSPITAL_COMMUNITY): Payer: Self-pay | Admitting: Obstetrics & Gynecology

## 2018-01-13 DIAGNOSIS — N83202 Unspecified ovarian cyst, left side: Secondary | ICD-10-CM | POA: Diagnosis not present

## 2018-01-13 DIAGNOSIS — K219 Gastro-esophageal reflux disease without esophagitis: Secondary | ICD-10-CM | POA: Diagnosis not present

## 2018-01-13 DIAGNOSIS — D251 Intramural leiomyoma of uterus: Secondary | ICD-10-CM | POA: Diagnosis not present

## 2018-01-13 DIAGNOSIS — K589 Irritable bowel syndrome without diarrhea: Secondary | ICD-10-CM | POA: Diagnosis not present

## 2018-01-13 DIAGNOSIS — R102 Pelvic and perineal pain: Secondary | ICD-10-CM | POA: Diagnosis not present

## 2018-01-13 DIAGNOSIS — Z885 Allergy status to narcotic agent status: Secondary | ICD-10-CM | POA: Diagnosis not present

## 2018-01-13 DIAGNOSIS — N72 Inflammatory disease of cervix uteri: Secondary | ICD-10-CM | POA: Diagnosis not present

## 2018-01-13 DIAGNOSIS — Z981 Arthrodesis status: Secondary | ICD-10-CM | POA: Diagnosis not present

## 2018-01-13 DIAGNOSIS — Z79899 Other long term (current) drug therapy: Secondary | ICD-10-CM | POA: Diagnosis not present

## 2018-01-13 DIAGNOSIS — F419 Anxiety disorder, unspecified: Secondary | ICD-10-CM | POA: Diagnosis not present

## 2018-01-13 DIAGNOSIS — N840 Polyp of corpus uteri: Secondary | ICD-10-CM | POA: Diagnosis not present

## 2018-01-13 DIAGNOSIS — G8929 Other chronic pain: Secondary | ICD-10-CM | POA: Diagnosis not present

## 2018-01-13 DIAGNOSIS — N800 Endometriosis of uterus: Secondary | ICD-10-CM | POA: Diagnosis not present

## 2018-01-13 LAB — CBC
HEMATOCRIT: 36.8 % (ref 36.0–46.0)
Hemoglobin: 11.8 g/dL — ABNORMAL LOW (ref 12.0–15.0)
MCH: 28.5 pg (ref 26.0–34.0)
MCHC: 32.1 g/dL (ref 30.0–36.0)
MCV: 88.9 fL (ref 78.0–100.0)
PLATELETS: 226 10*3/uL (ref 150–400)
RBC: 4.14 MIL/uL (ref 3.87–5.11)
RDW: 12.9 % (ref 11.5–15.5)
WBC: 9.3 10*3/uL (ref 4.0–10.5)

## 2018-01-13 MED ORDER — IBUPROFEN 800 MG PO TABS: 800.0000 mg | ORAL_TABLET | Freq: Three times a day (TID) | ORAL | 0 refills | Status: DC | PRN

## 2018-01-13 MED ORDER — HYDROMORPHONE HCL 2 MG PO TABS: 2.0000 mg | ORAL_TABLET | ORAL | 0 refills | Status: DC | PRN

## 2018-01-13 MED ORDER — KETOROLAC TROMETHAMINE 30 MG/ML IJ SOLN: 30.0000 mg | Freq: Four times a day (QID) | INTRAMUSCULAR | 0 refills | Status: DC

## 2018-01-13 NOTE — Discharge Instructions (Signed)

## 2018-01-13 NOTE — Progress Notes (Signed)
1 Day Post-Op Procedure(s) (LRB): TOTAL LAPAROSCOPIC HYSTERECTOMY WITH SALPINGECTOMY Possible BSO (Bilateral) CYSTOSCOPY (N/A)  Subjective: Patient reports less bloating today.  Pain is under good control.  No nausea.  Eating, walking, voiding.  Passing flatus.    Objective: I have reviewed patient's vital signs, intake and output, medications and labs.  General: alert and cooperative Resp: clear to auscultation bilaterally Cardio: regular rate and rhythm, S1, S2 normal, no murmur, click, rub or gallop GI: soft, non-tender; bowel sounds normal; no masses,  no organomegaly and incision: clean, dry, intact and extensive bruising esp of rlq port site Extremities: extremities normal, atraumatic, no cyanosis or edema Vaginal Bleeding: none   Assessment: s/p Procedure(s) with comments: TOTAL LAPAROSCOPIC HYSTERECTOMY WITH SALPINGECTOMY Possible BSO (Bilateral) - possible BSO CYSTOSCOPY (N/A): stable and progressing well  Plan: Discharge home  LOS: 0 days    Megan Salon 01/13/2018, 7:09 AM

## 2018-01-13 NOTE — Progress Notes (Signed)
Discharge teaching complete with pt. Pt understood all information and did not have any questions. Pt discharged home to family. 

## 2018-01-13 NOTE — Discharge Summary (Signed)
Physician Discharge Summary  Patient ID: Shelly Gilbert MRN: [id number] DOB/AGE: November 09, 1960 57 y.o.  Admit date: 01/12/2018 Discharge date: 01/13/2018  Admission Diagnoses: female pelvic pain, uterine fibroid, endometrial polyps, PMP bleeding  Discharge Diagnoses:  Active Problems:   Female pelvic pain  Discharged Condition: good  Hospital Course: Patient admitted through same day surgery.  She was taken to OR where TLH/BSO/cystoscopy were performed.  Surgical findings included fibroid uterus, ovarian cyst on left, normal right ovary, normal upper abdomne.  Surgery was uneventful.  EBL 30cc.  Foley catheter was removed before leaving OR.  Patient transferred to PACU where she was stable and then to 3rd floor for the remainder of her hospitalization.  During her post-op recovery, her vitals and stable and she was AF.  In evening of POD#0, she was able to transition to oral pain medications and regular diet.  She was able to ambulate and she had good pain control.  She was also able to void on her own.  Patient seen both in the evening of POD#0 and AM of POD#1.  In the AM of POD#1, she was without complaint.  Post op hb was 11.8, decreased from 13.7, pre-operatively.  At this point, patient was voiding, walking, having excellent pain control, had no nausea, and minimal vaginal bleeding.  She did have extensive bruising at her incision sites and a second dose of Lovenox was not given.  At this point, she was ready for D/C.  Consults: None  Significant Diagnostic Studies: labs: post op CBC with Hb 11.8  Treatments: surgery: TLH/bilateral salpingectomy/cystoscopy  Discharge Exam: Blood pressure 138/76, pulse 78, temperature 98.8 F (37.1 C), temperature source Oral, resp. rate 18, height 5\' 2"  (1.575 m), weight 129 lb (58.5 kg), last menstrual period 12/31/2017, SpO2 98 %. General appearance: alert, cooperative and no distress Resp: clear to auscultation bilaterally Cardio: regular rate and  rhythm, S1, S2 normal, no murmur, click, rub or gallop GI: soft, non-tender; bowel sounds normal; no masses,  no organomegaly Extremities: extremities normal, atraumatic, no cyanosis or edema Incision/Wound: c/d/i wtih extensive bruising  Gyn:  Minimal vaginal spotting  Disposition: Discharge disposition: 01-Home or Self Care       Discharge Instructions    Discharge patient   Complete by:  As directed    Discharge disposition:  01-Home or Self Care   Discharge patient date:  01/13/2018     Allergies as of 01/13/2018      Reactions   Codeine Other (See Comments)   Severe nightmares      Medication List    STOP taking these medications   metroNIDAZOLE 500 MG tablet Commonly known as:  FLAGYL     TAKE these medications   BENECOL PO Take 4 tablets by mouth at bedtime.   DIGESTIVE ADVANTAGE PO Take 1 tablet by mouth daily.   Fiber Chew Chew 1 tablet by mouth daily.   HYDROmorphone 2 MG tablet Commonly known as:  DILAUDID Take 1 tablet (2 mg total) by mouth every 4 (four) hours as needed for moderate pain or severe pain.   ibuprofen 800 MG tablet Commonly known as:  ADVIL,MOTRIN Take 1 tablet (800 mg total) by mouth every 8 (eight) hours as needed.   JUICE PLUS FIBRE PO Take 12 each by mouth daily.   ketorolac 30 MG/ML injection Commonly known as:  TORADOL Inject 1 mL (30 mg total) into the vein every 6 (six) hours.   KRILL OIL PO Take 1 capsule by mouth daily.  omeprazole 20 MG capsule Commonly known as:  PRILOSEC Take 1 capsule (20 mg total) by mouth daily.   VITAMIN D (ERGOCALCIFEROL) PO Take 1,000 Units by mouth daily.      Follow-up Information    Megan Salon, MD On 01/20/2018.   Specialty:  Gynecology Why:  appt time 8:30am Contact information: [address] [place] [address] [phone]           Signed: Megan Salon 01/13/2018, 9:51 AM

## 2018-01-16 ENCOUNTER — Telehealth: Payer: Self-pay | Admitting: Obstetrics & Gynecology

## 2018-01-16 ENCOUNTER — Ambulatory Visit: Payer: BLUE CROSS/BLUE SHIELD | Admitting: Gastroenterology

## 2018-01-16 NOTE — Telephone Encounter (Signed)
Patient is still having some bloating after surgery. Patient is asking if this is normal?

## 2018-01-16 NOTE — Telephone Encounter (Signed)
Spoke with patient. S/p TLH on 01/12/18.   1. Bloating is reducing daily, still bloated, asking when this will go away? Reports "very loose, darker in color bowel movements, not diarrhea", last BM this morning. Abdomen is soft. Has appetite, eating and hydrating well. Walking. Advised patient can take several weeks for bloating to go away as you heal from surgery. Continue to monitor, return call to office if unable to have BM, abdomen becomes distended/hard, fever/ chills, N/V, pain becomes severe, if after hours, seek immediate care at [facility].   2. Feels "sloshing and things shifting in her abdomen" when she changes positions.  .   Denies any bleeding or d/c. Taking 3 Advil prn for pain, "works well". Describes pain as pressure and discomfort. Denies any urinary symptoms. Chills on Wednesday night, no fever.    Advised will review with Dr. Sabra Heck and return call.  Routing to Dr. Sabra Heck. Please review.

## 2018-01-16 NOTE — Telephone Encounter (Signed)
Reviewed with Dr. Sabra Heck, call returned to patient. Advised patient feeling of "shifting" common with bloating. Eliminate use of straws and drinking carbonated beverages. Try warm beverages, gasX , heating pad, walking for reducing gas/bloating. Office visit offered, patient declined. Advised of results as seen below per Dr. Sabra Heck. Patient aware to return call with any additional concerns or changes in symptoms, verbalizes understanding.   Notes recorded by Megan Salon, MD on 01/16/2018 at 8:50 AM EDT Please let pt know about benign pathology results.  Routing to provider for final review. Patient is agreeable to disposition. Will close encounter.

## 2018-01-20 ENCOUNTER — Ambulatory Visit: Payer: BLUE CROSS/BLUE SHIELD | Admitting: Obstetrics & Gynecology

## 2018-01-20 ENCOUNTER — Ambulatory Visit (INDEPENDENT_AMBULATORY_CARE_PROVIDER_SITE_OTHER): Payer: BLUE CROSS/BLUE SHIELD | Admitting: Obstetrics & Gynecology

## 2018-01-20 ENCOUNTER — Encounter: Payer: Self-pay | Admitting: Obstetrics & Gynecology

## 2018-01-20 VITALS — BP 150/86 | HR 100 | Resp 14 | Ht 62.0 in | Wt 125.0 lb

## 2018-01-20 DIAGNOSIS — Z9889 Other specified postprocedural states: Secondary | ICD-10-CM

## 2018-01-20 NOTE — Progress Notes (Signed)
Post Operative Visit  Procedure:Total Laparoscopic Hysterectomy with Salpingectomy, possible BSO, Cystoscopy.  Days Post-op: 8  Subjective: Doing well.  Having some issues with looser stools.  Didn't have a BM for two days post op.  Then it was very loose and almost watery.  Now it is very soft but has the feeling of needing to have a BM but "can't get it to pass".  Is actually passing stool just feels like there is more all of the time.  Bladder function is normal.  Off pain medication.  Took 2 Dilaudid.  Took Motrin for about two days post op.    Objective: BP (!) 150/86 (BP Location: Right Arm, Patient Position: Sitting, Cuff Size: Normal)   Pulse 100   Resp 14   Ht 5\' 2"  (1.575 m)   Wt 125 lb (56.7 kg)   LMP 12/31/2017 (Approximate) Comment: bleeding irregular  BMI 22.86 kg/m   EXAM General: alert, cooperative and no distress Resp: clear to auscultation bilaterally Cardio: regular rate and rhythm, S1, S2 normal, no murmur, click, rub or gallop GI: soft, non-tender; bowel sounds normal; no masses,  no organomegaly and incision: clean, dry and intact, extensive bruising is improved Extremities: extremities normal, atraumatic, no cyanosis or edema Vaginal Bleeding: none  Gyn: NAEFG, cuff well approximated, no masses or fullness  Assessment: s/p TLH/BSO, cystoscopy  Plan: Recheck 4-6 weeks Starting probiotic Has appt with Dr. Fuller Plan in 3 weeks.  DVD given to show him bowel peristalsis at time of surgery.

## 2018-01-26 ENCOUNTER — Telehealth: Payer: Self-pay | Admitting: Obstetrics and Gynecology

## 2018-01-26 NOTE — Telephone Encounter (Signed)
The patient called c/o possible prolapse. She is 2 weeks s/p TLH/BSO, she c/o vaginal burning, when she went to the bathroom and wiped she felt a bulge. She denies vaginal bleeding or d/c. She has mild lower abdominal discomfort, similar to prior to her surgery. She was slightly constipated early today. No urinary c/o. Nothing is coming out of her vagina. I offered her evaluation at [facility] or f/u in the office in the am.  She stated she would call at 8 am for an appointment. She should call with any concerns.

## 2018-01-27 ENCOUNTER — Encounter: Payer: Self-pay | Admitting: Obstetrics & Gynecology

## 2018-01-27 ENCOUNTER — Ambulatory Visit: Payer: BLUE CROSS/BLUE SHIELD | Admitting: Obstetrics & Gynecology

## 2018-01-27 ENCOUNTER — Other Ambulatory Visit: Payer: Self-pay

## 2018-01-27 VITALS — BP 160/90 | HR 92 | Resp 14 | Wt 127.0 lb

## 2018-01-27 DIAGNOSIS — N898 Other specified noninflammatory disorders of vagina: Secondary | ICD-10-CM | POA: Diagnosis not present

## 2018-01-27 DIAGNOSIS — R3989 Other symptoms and signs involving the genitourinary system: Secondary | ICD-10-CM

## 2018-01-27 LAB — POCT URINALYSIS DIPSTICK
BILIRUBIN UA: NEGATIVE
GLUCOSE UA: NEGATIVE
KETONES UA: NEGATIVE
Nitrite, UA: NEGATIVE
Protein, UA: NEGATIVE
Urobilinogen, UA: NEGATIVE E.U./dL — AB
pH, UA: 5 (ref 5.0–8.0)

## 2018-01-27 MED ORDER — SULFAMETHOXAZOLE-TRIMETHOPRIM 800-160 MG PO TABS: 1.0000 | ORAL_TABLET | Freq: Two times a day (BID) | ORAL | 0 refills | Status: DC

## 2018-01-27 NOTE — Telephone Encounter (Signed)
Patient is calling for a appointment after talking with  Dr.Jertson last night.

## 2018-01-27 NOTE — Telephone Encounter (Signed)
Spoke with patient. Appointment scheduled for today at 10:15 am with Dr.Miller. Patient is agreeable to date and time.  Cc; Dr.Jertson  Routing to provider for final review. Patient agreeable to disposition. Will close encounter.

## 2018-01-27 NOTE — Progress Notes (Signed)
GYNECOLOGY  VISIT  CC:   Vaginal pressure   HPI: 57 y.o. G2P2 Married Caucasian female here for increased vaginal pressure and possible prolapse.  States she's felt OK but "not great".  Felt over the weekend that she was going to call yesterday to be seen.  She was outside Sunday pulling weeks.  Husband made her sit on the ground after a little while.  Pt thinks she over did it.  When we discussed increased activity last week, we did not discuss pulling weeds but increasing walking and walking on a treadmill if she felt comfortable doing this.  Denies fever, vaginal bleeding, or discharge.  Is having some pelvic pressure and discomfort low in pelvis.  Looked at self with mirror and saw vaginal opening with protruding finding.  Bowel function normal.    GYNECOLOGIC HISTORY: Patient's last menstrual period was 12/31/2017 (approximate). Contraception: hysterectomy  Menopausal hormone therapy: none  Patient Active Problem List   Diagnosis Date Noted  . Female pelvic pain 01/12/2018  . Endometriosis of uterus 10/29/2017  . Spondylolysis of lumbosacral region 02/01/2014    Past Medical History:  Diagnosis Date  . Adenomyosis   . Allergy    YEAR AROUND ALLERGIES  . Anemia   . Anxiety    situational per pt.  . Bursitis of hip    left  . Floaters, bilateral   . GERD (gastroesophageal reflux disease)   . Hemorrhoids   . Hiatal hernia   . Hyperlipidemia    borderline, controlled with diet per pt.  . IBS (irritable bowel syndrome)   . Seasonal allergies   . SVD (spontaneous vaginal delivery)    x 2  . Tubular adenoma of colon 04/2011    Past Surgical History:  Procedure Laterality Date  . COLONOSCOPY     polyps  . CYSTOSCOPY N/A 01/12/2018   Procedure: CYSTOSCOPY;  Surgeon: Megan Salon, MD;  Location: [facility];  Service: Gynecology;  Laterality: N/A;  . MAXIMUM ACCESS (MAS)POSTERIOR LUMBAR INTERBODY FUSION (PLIF) 1 LEVEL N/A 02/01/2014   Procedure: LUMBAR FIVE-SACFAL ONE  POSTERIOR LUMBAR INTERBODY FUSION,GIL PROCEDURE;  Surgeon: Erline Levine, MD;  Location: [facility];  Service: Neurosurgery;  Laterality: N/A;  . TONSILLECTOMY    . TOTAL LAPAROSCOPIC HYSTERECTOMY WITH SALPINGECTOMY Bilateral 01/12/2018   Procedure: TOTAL LAPAROSCOPIC HYSTERECTOMY WITH SALPINGECTOMY Possible BSO;  Surgeon: Megan Salon, MD;  Location: [facility];  Service: Gynecology;  Laterality: Bilateral;  possible BSO  . UPPER GI ENDOSCOPY     reflux  . WISDOM TOOTH EXTRACTION      MEDS:   Current Outpatient Medications on File Prior to Visit  Medication Sig Dispense Refill  . Fiber CHEW Chew 1 tablet by mouth daily.     Marland Kitchen KRILL OIL PO Take 1 capsule by mouth daily.     . Nutritional Supplements (JUICE PLUS FIBRE PO) Take 12 each by mouth daily.    Marland Kitchen omeprazole (PRILOSEC) 20 MG capsule Take 1 capsule (20 mg total) by mouth daily. 30 capsule 11  . Plant Stanol Ester (BENECOL PO) Take 4 tablets by mouth at bedtime.     . Probiotic Product (DIGESTIVE ADVANTAGE PO) Take 1 tablet by mouth daily.    Marland Kitchen VITAMIN D, ERGOCALCIFEROL, PO Take 1,000 Units by mouth daily.      No current facility-administered medications on file prior to visit.     ALLERGIES: Codeine  Family History  Problem Relation Age of Onset  . Colon cancer Paternal Grandfather   .  Diabetes Father   . Colon polyps Brother   . Breast cancer Cousin 69  . Diabetes Mother   . Hypertension Mother   . Hypertension Sister    SH:  Married, non smoker  Review of Systems  Constitutional: Negative.   HENT: Negative.   Eyes: Negative.   Respiratory: Negative.   Cardiovascular: Negative.   Gastrointestinal: Negative.   Genitourinary:       Vaginal pressure   Musculoskeletal: Negative.   Skin: Negative.   Neurological: Negative.   Endo/Heme/Allergies: Negative.   Psychiatric/Behavioral: Negative.   All other systems reviewed and are negative.   PHYSICAL EXAMINATION:    BP (!) 160/90 (BP Location: Right Arm, Patient  Position: Sitting, Cuff Size: Normal)   Pulse 92   Resp 14   Wt 127 lb (57.6 kg)   LMP 12/31/2017 (Approximate) Comment: bleeding irregular  BMI 23.23 kg/m     General appearance: alert, cooperative and appears stated age Abdomen: soft, +suprapubic tenderness; bowel sounds normal; no masses,  no organomegaly  Pelvic: External genitalia:  no lesions              Urethra:  normal appearing urethra with no masses, tenderness or lesions              Bartholins and Skenes: normal                 Vagina: cuff well approximated, no masses or fullness, no bleeding or discharge,cuff non tender and without fullness, small rectocele present but this is unchanged to me since prior to surgery (we never discussed correction as it is small and has been asymptomatic)              Cervix: absent              Bimanual Exam:  Uterus:  uterus absent              Adnexa: no mass, fullness, tenderness  Assessment: H/O microscopic hematuria with abnormal appearing urine today Pelvic pressure and concerns regarding rectocele Suprapubic tenderness, possible UTI  Plan: Bactrim DS liquid 200/40 per 5cc.  20cc BID x 5 days.  Called this to pharmacy personally. Urine micro and urine culture pending Results will be called to pt personally.  Has follow-up scheduled but this may need to be adjusted pending results.

## 2018-01-28 LAB — URINE CULTURE

## 2018-01-29 ENCOUNTER — Other Ambulatory Visit: Payer: Self-pay | Admitting: Obstetrics & Gynecology

## 2018-01-29 ENCOUNTER — Telehealth: Payer: Self-pay

## 2018-01-29 MED ORDER — CEPHALEXIN 250 MG/5ML PO SUSR: 500.0000 mg | Freq: Four times a day (QID) | ORAL | 0 refills | Status: DC

## 2018-01-29 MED ORDER — AMOXICILLIN-POT CLAVULANATE 500-125 MG PO TABS: 1.0000 | ORAL_TABLET | Freq: Two times a day (BID) | ORAL | 0 refills | Status: DC

## 2018-01-29 MED ORDER — FLUCONAZOLE 150 MG PO TABS: 150.0000 mg | ORAL_TABLET | Freq: Once | ORAL | 0 refills | Status: AC

## 2018-01-29 NOTE — Telephone Encounter (Signed)
Patient returned call states that she spoke with the pharmacy and Augmentin 500 mg is too large for her to swallow. Patient requests liquid medication.  Spoke with pharmacy who does not have chewable tablets in stock. Has Augmentin 400-57 mg/5 mL or 600-42.9 mg/65mL in suspension.  Routing to [place] for review.

## 2018-01-29 NOTE — Telephone Encounter (Signed)
Spoke with patient. Patient would like to take Augmentin. States she is unable to swallow very large pills and if this is going to be large may need a liquid form. Advised will sent a message to [place] for review and so rx can be sent to pharmacy.

## 2018-01-29 NOTE — Telephone Encounter (Signed)
She can only use penicillin, amoxicillin or Augmentin to treat this.  Does she have a preference of those three?

## 2018-01-29 NOTE — Telephone Encounter (Signed)
Spoke with patient. Results given. Patient states that every time she takes Amoxicillin for something else it does not work. "I get an upset stomach and then they end up having to switch me off the medication to something else." Advised will review with Dr.Miller and return call.

## 2018-01-29 NOTE — Telephone Encounter (Signed)
Spoke with [facility]. Only chewable tablet they have is Augmentin/Clavulanate 250 mg. Call to patient. Advised can send in Augmentin/Clavulanate 250 mg chewable tablets take 2 tablets BID or can send in Augmentin 500 mg po BID. Patient states she is okay to take 500 mg tablet po BID. Advised will send to pharmacy. Rx for Augmentin-Clavulanate 500-125 mg po BID x 5 days #10 0RF sent to pharmacy on file. Rx for Diflucan 150 mg po x1 repeat in 72 hours if needed sent to pharmacy as well. 1 week follow up scheduled for next week 4/18 at 11 am with Dr.Miller.

## 2018-01-29 NOTE — Telephone Encounter (Signed)
-----   Message from Megan Salon, MD sent at 01/29/2018  8:20 AM EDT ----- Please let pt know her urine culture was positive for Group B strep.  Antibiotic needs to be changed to amoxicillin 500mg  TID x 5 days.  Would repeat culture in one week since she is post op.  Amoxicillin may cause her to have yeast vaginitis.  Ok to also send in rx for diflucan 150mg  po x 1, repeat 72 hours if needed.  #2.  Please scheduled OV with repeat urine culture next Thursday if possible.  Thanks.

## 2018-01-29 NOTE — Telephone Encounter (Signed)
Reviewed UTD recommendations.  Keflex also ok.  rx for 250mg /27ml, 77ml qid to pharmacy electronically.  Spoke with pharmacist regarding this as well to ensure they had it in stock.  #244ml's send.  Pt notified.  Ok to close encounter.

## 2018-01-29 NOTE — Telephone Encounter (Signed)
Could you please call her pharmacy and see if they have this in a chewable or suspension that can be dosed 500/875 (amoxicillin/clavulanate) BID x 5 days

## 2018-02-04 DIAGNOSIS — M25561 Pain in right knee: Secondary | ICD-10-CM | POA: Diagnosis not present

## 2018-02-05 ENCOUNTER — Encounter: Payer: Self-pay | Admitting: Obstetrics & Gynecology

## 2018-02-05 ENCOUNTER — Ambulatory Visit: Payer: BLUE CROSS/BLUE SHIELD | Admitting: Obstetrics & Gynecology

## 2018-02-05 VITALS — BP 126/86 | HR 102 | Resp 14 | Ht 62.0 in | Wt 129.0 lb

## 2018-02-05 DIAGNOSIS — N390 Urinary tract infection, site not specified: Secondary | ICD-10-CM

## 2018-02-05 DIAGNOSIS — R102 Pelvic and perineal pain: Secondary | ICD-10-CM | POA: Diagnosis not present

## 2018-02-05 LAB — POCT URINALYSIS DIPSTICK
Bilirubin, UA: NEGATIVE
Glucose, UA: 100
KETONES UA: NEGATIVE
Leukocytes, UA: NEGATIVE
Nitrite, UA: NEGATIVE
PH UA: 5 (ref 5.0–8.0)
PROTEIN UA: NEGATIVE
UROBILINOGEN UA: 0.2 U/dL

## 2018-02-05 NOTE — Patient Instructions (Signed)
If urine culture is negative, you may want to try some AZO standard.  You can take it three times a day.  Your urine will be really orange.  If you don't have any change in symptoms after 3 days, just stop it.

## 2018-02-05 NOTE — Progress Notes (Signed)
GYNECOLOGY  VISIT  CC:   Follow-up, post-op, GBS positive  HPI: 57 y.o. G2P2 Married Caucasian female here for follow up UTI.  Had a steroid injection in her right knee yesterday.  Pt aware pulse is elevated today.  She also has glucose in her urine today.  Had a steroid injection yesterday in her knee.  She is still having some mild urinary pressure and some vaginal pain.  Denies dysuria.  Has a stronger sense of needing to void.    Continues to feel some pressure and feels like there is a bulge that is present that wasn't there before surgery.    Also feels like she has some skin burning/irritation.  Denies vaginal bleeding abnormal discharge.  GYNECOLOGIC HISTORY: Patient's last menstrual period was 12/31/2017 (approximate). Contraception: hysterectomy  Menopausal hormone therapy: none  Patient Active Problem List   Diagnosis Date Noted  . Female pelvic pain 01/12/2018  . Endometriosis of uterus 10/29/2017  . Spondylolysis of lumbosacral region 02/01/2014    Past Medical History:  Diagnosis Date  . Adenomyosis   . Allergy    YEAR AROUND ALLERGIES  . Anemia   . Anxiety    situational per pt.  . Bursitis of hip    left  . Floaters, bilateral   . GERD (gastroesophageal reflux disease)   . Hemorrhoids   . Hiatal hernia   . Hyperlipidemia    borderline, controlled with diet per pt.  . IBS (irritable bowel syndrome)   . Seasonal allergies   . SVD (spontaneous vaginal delivery)    x 2  . Tubular adenoma of colon 04/2011    Past Surgical History:  Procedure Laterality Date  . COLONOSCOPY     polyps  . CYSTOSCOPY N/A 01/12/2018   Procedure: CYSTOSCOPY;  Surgeon: Megan Salon, MD;  Location: [facility];  Service: Gynecology;  Laterality: N/A;  . MAXIMUM ACCESS (MAS)POSTERIOR LUMBAR INTERBODY FUSION (PLIF) 1 LEVEL N/A 02/01/2014   Procedure: LUMBAR FIVE-SACFAL ONE POSTERIOR LUMBAR INTERBODY FUSION,GIL PROCEDURE;  Surgeon: Erline Levine, MD;  Location: [facility];  Service:  Neurosurgery;  Laterality: N/A;  . TONSILLECTOMY    . TOTAL LAPAROSCOPIC HYSTERECTOMY WITH SALPINGECTOMY Bilateral 01/12/2018   Procedure: TOTAL LAPAROSCOPIC HYSTERECTOMY WITH SALPINGECTOMY Possible BSO;  Surgeon: Megan Salon, MD;  Location: [facility];  Service: Gynecology;  Laterality: Bilateral;  possible BSO  . UPPER GI ENDOSCOPY     reflux  . WISDOM TOOTH EXTRACTION      MEDS:   Current Outpatient Medications on File Prior to Visit  Medication Sig Dispense Refill  . Fiber CHEW Chew 1 tablet by mouth daily.     Marland Kitchen KRILL OIL PO Take 1 capsule by mouth daily.     . Nutritional Supplements (JUICE PLUS FIBRE PO) Take 12 each by mouth daily.    Marland Kitchen omeprazole (PRILOSEC) 20 MG capsule Take 1 capsule (20 mg total) by mouth daily. 30 capsule 11  . Plant Stanol Ester (BENECOL PO) Take 4 tablets by mouth at bedtime.     . Probiotic Product (DIGESTIVE ADVANTAGE PO) Take 1 tablet by mouth daily.    Marland Kitchen VITAMIN D, ERGOCALCIFEROL, PO Take 1,000 Units by mouth daily.     . meloxicam (MOBIC) 15 MG tablet Take 1 tablet by mouth daily.     No current facility-administered medications on file prior to visit.     ALLERGIES: Codeine  Family History  Problem Relation Age of Onset  . Colon cancer Paternal Grandfather   . Diabetes  Father   . Colon polyps Brother   . Breast cancer Cousin 44  . Diabetes Mother   . Hypertension Mother   . Hypertension Sister     SH:  Married, non smoker  Review of Systems  Genitourinary: Positive for dysuria.  Musculoskeletal: Positive for joint pain.  All other systems reviewed and are negative.   PHYSICAL EXAMINATION:    BP 126/86 (BP Location: Right Arm, Patient Position: Sitting, Cuff Size: Normal)   Pulse (!) 102   Resp 14   Ht 5\' 2"  (1.575 m)   Wt 129 lb (58.5 kg)   LMP 12/31/2017 (Approximate) Comment: bleeding irregular  BMI 23.59 kg/m     General appearance: alert, cooperative and appears stated age Abdomen: soft, non-tender; bowel sounds normal;  no masses,  no organomegaly Incisions:  C/D/I  Pelvic: External genitalia:  no lesions              Urethra:  normal appearing urethra with no masses, tenderness or lesions              Bartholins and Skenes: normal                 Vagina: normal appearing vagina with normal color and discharge, no lesions               Cervix: absent and well healing vaginal apex  Chaperone was present for exam.  Assessment: TLH/BSO/cystoscopy GBS + UTI  Skin irritation/burning Pelvic pressure  Plan: Urine culture pending Trial of AZO suggested.  Information given.  Advised starting topical estrogen cream topically two to three times weekly.  Does not need rx. Will plan pelvic PT once pt is far enough along in her post op healing. Return in 3 weeks for post op visit  ~20 minutes spent with patient >50% of time was in face to face discussion of above.

## 2018-02-06 LAB — URINALYSIS, MICROSCOPIC ONLY
Bacteria, UA: NONE SEEN
CASTS: NONE SEEN /LPF
EPITHELIAL CELLS (NON RENAL): NONE SEEN /HPF (ref 0–10)

## 2018-02-06 LAB — URINE CULTURE: ORGANISM ID, BACTERIA: NO GROWTH

## 2018-02-13 ENCOUNTER — Encounter: Payer: Self-pay | Admitting: Gastroenterology

## 2018-02-13 ENCOUNTER — Ambulatory Visit: Payer: BLUE CROSS/BLUE SHIELD | Admitting: Gastroenterology

## 2018-02-13 VITALS — BP 140/80 | HR 82 | Ht 62.0 in | Wt 130.4 lb

## 2018-02-13 DIAGNOSIS — R1011 Right upper quadrant pain: Secondary | ICD-10-CM

## 2018-02-13 DIAGNOSIS — R140 Abdominal distension (gaseous): Secondary | ICD-10-CM | POA: Diagnosis not present

## 2018-02-13 DIAGNOSIS — K219 Gastro-esophageal reflux disease without esophagitis: Secondary | ICD-10-CM | POA: Diagnosis not present

## 2018-02-13 NOTE — Patient Instructions (Signed)
Take your omeprazole daily.   Follow up with our office as needed.  Normal BMI (Body Mass Index- based on height and weight) is between 19 and 25. Your BMI today is Body mass index is 23.85 kg/m. Marland Kitchen Please consider follow up  regarding your BMI with your Primary Care Provider.  Thank you for choosing me and [facility].  Pricilla Riffle. Dagoberto Ligas., MD., Mar[facility] Regal

## 2018-02-13 NOTE — Progress Notes (Signed)
    History of Present Illness: This is a 57 year old female returning for follow-up of RUQ abdominal pain, abdominal bloating, change in bowel habits and GERD.  She is accompanied by her husband.  She underwent total laparoscopic hysterectomy on 3/25 and incidentally significant peristalsis of the small bowel was noted by Dr. Sabra Heck.  I reviewed a short video clip taken during her hysterectomy and supplied on a CD.  There is a segment of small bowel that has peristaltic activity on the video clip.  Since her hysterectomy she has noted a return in bowel pattern to her prior normal pattern which is mild constipation. Her abdominal pain an abdominal bloating have resolved.  She noted problems with rectum prolapsing into the vagina post operatively and has reviewed this with Dr. Sabra Heck.  She resumed taking fiber supplements and increasing her fluids which has helped resolve her mild constipation.  She took omeprazole for a month and noticed substantial improvement in her reflux symptoms however she discontinue it. She was concerned about using the medication long-term.  She noted throat clearing and hoarseness were substantially improved on omeprazole.  She did not note any benefit from dicyclomine so she discontinued it.  Abd [place] 11/2017 IMPRESSION: Simple right hepatic cyst. No other abnormality seen in the abdomen  Current Medications, Allergies, Past Medical History, Past Surgical History, Family History and Social History were reviewed in [employer] record.  Physical Exam: General: Well developed, well nourished, no acute distress Head: Normocephalic and atraumatic Eyes:  sclerae anicteric, EOMI Ears: Normal auditory acuity Mouth: No deformity or lesions Lungs: Clear throughout to auscultation Heart: Regular rate and rhythm; no murmurs, rubs or bruits Abdomen: Soft, non tender and non distended. No masses, hepatosplenomegaly or hernias noted. Normal Bowel  sounds Musculoskeletal: Symmetrical with no gross deformities  Pulses:  Normal pulses noted  Extremities: No clubbing, cyanosis, edema or deformities noted Neurological: Alert oriented x 4, grossly nonfocal Psychological:  Alert and cooperative. Normal mood and affect  Assessment and Recommendations:  1. RUQ abdominal pain, abdominal bloating, change in bowel habits.  Etiology not clear. Symptoms have resolved.  I do not have an explanation for the small bowel peristaltic activity noted during her hysterectomy.  An abdominal/pelvic CT scan could provide more information however since her symptoms have resolved she would like to defer on a CT and I feel that is reasonable.  If her abdominal symptoms return we will proceed with an abdominal/pelvic CT.  2. GERD with LPR. Follow standard antireflux measures.  Resume omeprazole 20 mg p.o. every morning.  We discussed the risk and benefits of PPI usage.  3. Personal history of adenomatous colon polyps.  A 5-year interval colonoscopy is due in August 2022.

## 2018-02-24 ENCOUNTER — Ambulatory Visit: Payer: BLUE CROSS/BLUE SHIELD | Admitting: Obstetrics & Gynecology

## 2018-02-26 ENCOUNTER — Ambulatory Visit: Payer: BLUE CROSS/BLUE SHIELD | Admitting: Obstetrics & Gynecology

## 2018-02-26 ENCOUNTER — Telehealth: Payer: Self-pay | Admitting: *Deleted

## 2018-02-26 ENCOUNTER — Other Ambulatory Visit: Payer: Self-pay

## 2018-02-26 ENCOUNTER — Encounter: Payer: Self-pay | Admitting: Obstetrics & Gynecology

## 2018-02-26 VITALS — BP 152/86 | HR 98 | Resp 14 | Ht 62.5 in | Wt 130.4 lb

## 2018-02-26 DIAGNOSIS — R220 Localized swelling, mass and lump, head: Secondary | ICD-10-CM | POA: Diagnosis not present

## 2018-02-26 DIAGNOSIS — N816 Rectocele: Secondary | ICD-10-CM | POA: Diagnosis not present

## 2018-02-26 LAB — CBC WITH DIFFERENTIAL/PLATELET
BASOS ABS: 0 10*3/uL (ref 0.0–0.2)
Basos: 0 %
EOS (ABSOLUTE): 0.2 10*3/uL (ref 0.0–0.4)
Eos: 2 %
HEMOGLOBIN: 13.8 g/dL (ref 11.1–15.9)
Hematocrit: 40.8 % (ref 34.0–46.6)
LYMPHS ABS: 1.3 10*3/uL (ref 0.7–3.1)
Lymphs: 14 %
MCH: 28.6 pg (ref 26.6–33.0)
MCHC: 33.8 g/dL (ref 31.5–35.7)
MCV: 85 fL (ref 79–97)
MONOS ABS: 0.7 10*3/uL (ref 0.1–0.9)
Monocytes: 7 %
NEUTROS PCT: 77 %
Neutrophils Absolute: 7.5 10*3/uL — ABNORMAL HIGH (ref 1.4–7.0)
PLATELETS: 240 10*3/uL (ref 150–379)
RBC: 4.83 x10E6/uL (ref 3.77–5.28)
RDW: 13.5 % (ref 12.3–15.4)
WBC: 9.7 10*3/uL (ref 3.4–10.8)

## 2018-02-26 LAB — SEDIMENTATION RATE: SED RATE: 9 mm/h (ref 0–40)

## 2018-02-26 NOTE — Telephone Encounter (Signed)
Shelly Gilbert with [facility] calling with STAT lab results.   Neutrophils 7.5 All other labs WNL  Routing to Dr. Sabra Heck -please review and advise.

## 2018-02-26 NOTE — Telephone Encounter (Signed)
Advised as seen below per Dr. Sabra Heck. Patient reports symptoms have improved since taking the pepcid, "almost normal". Patient will return call on 5/10 with update.   Routing to provider for final review. Patient is agreeable to disposition. Will close encounter.

## 2018-02-26 NOTE — Telephone Encounter (Signed)
Please let pt know her total WBC ct is not elevated and her hb is normal.  Sed rate is normal.  How does she feel since taking Pepcid 40?  If improved, would she give update tomorrow morning.  If not, I think we need to see if her PCP can see her today or tomorrow?  Thanks.

## 2018-02-26 NOTE — Progress Notes (Signed)
Post Operative Visit  Procedure: Total Laparoscopic Hysterectomy with Salpingectomy, Cystoscopy.  Days Post-op: 38  Subjective: Here for post op visit.  Woke up with right sided lip swelling and numbness/tingling.  Has not had any bites on her face.  No mouth trauma other than burned the roof of her mouth Sunday or Monday.  Movement of her face is normal.  Was not outside yesterday.  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  Moving lips to put in lip gloss feels different to her.  No ulcerations in mouth or on lip.  Took a Benadryl and Aleve last night due to allergies.  From surgery standpoint, bladder function is normal.  Bowel function is normal.  The pressure she was feeling has improved.  Bulge is better.  Still interested in pelvic PT.  Will place referral today.  Denies vaginal bleeding.    Objective: BP (!) 152/86 (BP Location: Right Arm, Patient Position: Sitting, Cuff Size: Normal)   Pulse 98   Resp 14   Ht 5' 2.5" (1.588 m)   Wt 130 lb 6.4 oz (59.1 kg)   LMP 12/31/2017 (Approximate) Comment: bleeding irregular  BMI 23.47 kg/m   EXAM Physical Exam  Constitutional: She is oriented to person, place, and time. She appears well-developed and well-nourished.  HENT:  Head: Normocephalic and atraumatic.    Neck: Normal range of motion. Neck supple. No thyromegaly present.  Cardiovascular: Normal rate and regular rhythm.  Respiratory: Effort normal and breath sounds normal.  GI: Soft. Bowel sounds are normal.  Genitourinary: Vagina normal. No vaginal discharge found.  Genitourinary Comments: Cuff well approximated.  No masses or sutures.  Lymphadenopathy:    She has no cervical adenopathy.  Neurological: She is alert and oriented to person, place, and time. No cranial nerve deficit.  Skin: Skin is warm and dry.  Psychiatric: She has a normal mood and affect.    Assessment: s/p TLH/bilateral salpingectomy/cystoscopy Mild rectocele Pelvic pressure that has  improved Swelling right side of lips, normal neurological exam  Plan: CBC with diff, ESR, CMP, C3/C4 ordered Pepcid 34m po x 1 today.  Will call pt later today with blood work from stat orders and get update Referral to pelvic PT Will need AEX 1 year

## 2018-02-27 LAB — COMPREHENSIVE METABOLIC PANEL
ALK PHOS: 78 IU/L (ref 39–117)
ALT: 13 IU/L (ref 0–32)
AST: 16 IU/L (ref 0–40)
Albumin/Globulin Ratio: 2 (ref 1.2–2.2)
Albumin: 4.6 g/dL (ref 3.5–5.5)
BUN/Creatinine Ratio: 26 — ABNORMAL HIGH (ref 9–23)
BUN: 19 mg/dL (ref 6–24)
Bilirubin Total: 0.2 mg/dL (ref 0.0–1.2)
CO2: 25 mmol/L (ref 20–29)
CREATININE: 0.74 mg/dL (ref 0.57–1.00)
Calcium: 10.3 mg/dL — ABNORMAL HIGH (ref 8.7–10.2)
Chloride: 101 mmol/L (ref 96–106)
GFR calc Af Amer: 105 mL/min/{1.73_m2} (ref >59)
GFR calc non Af Amer: 91 mL/min/{1.73_m2} (ref >59)
GLUCOSE: 85 mg/dL (ref 65–99)
Globulin, Total: 2.3 g/dL (ref 1.5–4.5)
Potassium: 4.9 mmol/L (ref 3.5–5.2)
Sodium: 143 mmol/L (ref 134–144)
Total Protein: 6.9 g/dL (ref 6.0–8.5)

## 2018-02-27 LAB — C-REACTIVE PROTEIN: CRP: 5.2 mg/L — AB (ref 0.0–4.9)

## 2018-02-27 LAB — C3 AND C4
COMPLEMENT C3, SERUM: 136 mg/dL (ref 82–167)
COMPLEMENT C4, SERUM: 29 mg/dL (ref 14–44)

## 2018-02-28 LAB — SEDIMENTATION RATE

## 2018-02-28 LAB — CBC WITH DIFFERENTIAL/PLATELET

## 2018-03-04 ENCOUNTER — Telehealth: Payer: Self-pay

## 2018-03-04 DIAGNOSIS — M25561 Pain in right knee: Secondary | ICD-10-CM | POA: Diagnosis not present

## 2018-03-04 NOTE — Telephone Encounter (Signed)
-----   Message from Megan Salon, MD sent at 03/04/2018  6:36 AM EDT ----- Please call pt and make sure that her facial edema fully resolved.  She was to call back and give update but did not.  Her CMP showed mildly elevated calcium (0.1 point above normal) and her CRP was mildly elevated.  These two can be seen at these mild levels of elevated post operatively.  The CRP is not in a range where infection is seen (this I usually >10).  I'd like to repeat both in 4-6 weeks.  Orders have not been placed.  Thanks.

## 2018-03-04 NOTE — Telephone Encounter (Signed)
I don't think she should add anything.  I want to see if this normalizes on it's own.  Also, did her facial edema fully resolve?  Thanks.

## 2018-03-04 NOTE — Telephone Encounter (Signed)
Spoke with patient. Results given. Patient verbalizes understanding. Lab appointment scheduled for 04/03/2018 at 10 am. Patinet is asking if taking collagen could increase her calcium and CRP. Advised will review with Dr.Miller and return call.

## 2018-03-04 NOTE — Telephone Encounter (Addendum)
Patient is currently taking Collagen. Okay to continue or should she d/c until future labs are complete. Reported edema has completely resolved. States edema went down the same day of her visit and has not returned. No further concerns.

## 2018-03-06 NOTE — Telephone Encounter (Signed)
If this is not new, ok to continue.  If just started it, I would stop until follow up lab work.  Thanks.

## 2018-03-06 NOTE — Telephone Encounter (Signed)
Spoke with patient, advised as seen below. Patient reports lip swelling has resolved, knee is still swollen. Patient states she was not asking if she should continue collagen, this is a new medication. Patient reports she did her own research on CRP and found that collagen helps to reduce inflammation. States she really feels her knee is the cause of elevated CRP and collagen will help. Again advised of recommendations per Dr. Sabra Heck. Will update Dr. Sabra Heck and return call if any additional recommendations provided.   Routing to provider for final review. Patient is agreeable to disposition. Will close encounter.

## 2018-03-09 DIAGNOSIS — N393 Stress incontinence (female) (male): Secondary | ICD-10-CM | POA: Diagnosis not present

## 2018-03-09 DIAGNOSIS — M6289 Other specified disorders of muscle: Secondary | ICD-10-CM | POA: Diagnosis not present

## 2018-03-09 DIAGNOSIS — M6281 Muscle weakness (generalized): Secondary | ICD-10-CM | POA: Diagnosis not present

## 2018-03-09 DIAGNOSIS — R102 Pelvic and perineal pain: Secondary | ICD-10-CM | POA: Diagnosis not present

## 2018-03-12 DIAGNOSIS — M25561 Pain in right knee: Secondary | ICD-10-CM | POA: Diagnosis not present

## 2018-03-13 DIAGNOSIS — M25561 Pain in right knee: Secondary | ICD-10-CM | POA: Diagnosis not present

## 2018-03-20 DIAGNOSIS — M6289 Other specified disorders of muscle: Secondary | ICD-10-CM | POA: Diagnosis not present

## 2018-03-20 DIAGNOSIS — R102 Pelvic and perineal pain: Secondary | ICD-10-CM | POA: Diagnosis not present

## 2018-03-20 DIAGNOSIS — N816 Rectocele: Secondary | ICD-10-CM | POA: Diagnosis not present

## 2018-03-20 DIAGNOSIS — M6281 Muscle weakness (generalized): Secondary | ICD-10-CM | POA: Diagnosis not present

## 2018-04-01 ENCOUNTER — Telehealth: Payer: Self-pay | Admitting: Obstetrics & Gynecology

## 2018-04-01 NOTE — Telephone Encounter (Signed)
Patient thinks she may want to cancel her calcium CRP appointment 04/03/18 due to her knee surgery. Patient would like to talk with Dr.Miller's nurse first.

## 2018-04-01 NOTE — Telephone Encounter (Signed)
Spoke with patient. Patient states that she is having knee surgery on 04/16/2018. Would like to wait on having her calcium and CRP rechecked. Feels this is related to her knee inflammation and the need for surgery. Would like to know if Dr.Miller is agreeable to this?

## 2018-04-02 ENCOUNTER — Other Ambulatory Visit: Payer: Self-pay | Admitting: Obstetrics & Gynecology

## 2018-04-02 DIAGNOSIS — R899 Unspecified abnormal finding in specimens from other organs, systems and tissues: Secondary | ICD-10-CM

## 2018-04-02 NOTE — Telephone Encounter (Signed)
As calcium is only 0.1 points above normal, I am fine with that.  Order is placed for lab work until 07/03/18 so can move lab appt to later date.  If later than 9/13, please let me know so I can modify order. Thanks.

## 2018-04-02 NOTE — Telephone Encounter (Signed)
Call to patient. Notified of Dr Ammie Ferrier response for follow-up labs. Patient rescheduled to 07-03-18, needs Friday appointment. Aware to call back if changes appointment beyond this date.

## 2018-04-03 ENCOUNTER — Other Ambulatory Visit: Payer: BLUE CROSS/BLUE SHIELD

## 2018-04-10 ENCOUNTER — Telehealth: Payer: Self-pay | Admitting: Obstetrics & Gynecology

## 2018-04-10 ENCOUNTER — Ambulatory Visit: Payer: BLUE CROSS/BLUE SHIELD | Admitting: Obstetrics & Gynecology

## 2018-04-10 NOTE — Telephone Encounter (Signed)
Spoke with patient. S/p TLH 01/12/18. Patient reports light brow vaginal d/c when wiping. Noticed on 3 different occasions, first time was 2 wks ago, 5 days ago and this morning. Notices this happened the first 2 times with increased activity and this morning when straining with BM. Menses like cramps this morning, 3/10, no meds. Reports constant fullness and pressure.  Has "colon prolapse", has stopped pelvic PT, doing this on her own. Things do feel better than 2 mo ago. Symptoms are better in the morning, worse in evening.   Last BM today, taking fiber gummies daily.   Denies fever/chills, N/V, urinary symptoms, lower back pain, vaginal odor, itching.   Recommended OV for further evaluation, OV scheduled for 6/28 at 2:30pm. Patient declined OV for 6/25, 6/27 and morning of 6/28. Advised to continue to monitor, return call to office for earlier appt if symptoms worsen or new symptoms develop.   Routing to provider for final review. Patient is agreeable to disposition. Will close encounter.

## 2018-04-10 NOTE — Telephone Encounter (Signed)
Patient has had a hysterectomy. She has been having a light brown discharge when she wipes. She has had light "period cramps" today. She has a colon prolapse, and notices it happens on days when she is busy. Stated that she has been told that colon prolapse can be treated with exercises and not surgery. Patient stated that she just wants to make sure it is nothing to be concerned about.

## 2018-04-17 ENCOUNTER — Ambulatory Visit: Payer: BLUE CROSS/BLUE SHIELD | Admitting: Obstetrics & Gynecology

## 2018-04-17 ENCOUNTER — Other Ambulatory Visit: Payer: Self-pay

## 2018-04-17 ENCOUNTER — Encounter: Payer: Self-pay | Admitting: Obstetrics & Gynecology

## 2018-04-17 VITALS — BP 136/80 | HR 88 | Resp 16 | Ht 62.5 in | Wt 134.0 lb

## 2018-04-17 DIAGNOSIS — N898 Other specified noninflammatory disorders of vagina: Secondary | ICD-10-CM | POA: Diagnosis not present

## 2018-04-17 NOTE — Progress Notes (Signed)
GYNECOLOGY  VISIT  CC:   Vaginal discharge  HPI: 57 y.o. G2P2 Married Caucasian female here for vaginal discharge that is new.  She's noticed this only when she's wiped and looks a little brownish.  Rectocele is still present.  Feels being in a routine has really helped.  She is back at work.  Having some constipation.  Does not have the sensations of pressure in the vagina.  Doing pelvic floor exercises regularly.   Having vaginal dryness.  Does notice this some with intercourse.    Husband expressed some concerns about whether discharge she was feeling is stool (fistula?) so pt just wanted to share this today.  Having knee issues.  Had surgery scheduled but has decided to have a second opinion.    GYNECOLOGIC HISTORY: Patient's last menstrual period was 12/31/2017 (approximate). Contraception: hysterectomy  Menopausal hormone therapy: none  Patient Active Problem List   Diagnosis Date Noted  . Female pelvic pain 01/12/2018  . Endometriosis of uterus 10/29/2017  . Spondylolysis of lumbosacral region 02/01/2014    Past Medical History:  Diagnosis Date  . Adenomyosis   . Allergy    YEAR AROUND ALLERGIES  . Anemia   . Anxiety    situational per pt.  . Bursitis of hip    left  . Floaters, bilateral   . GERD (gastroesophageal reflux disease)   . Hemorrhoids   . Hiatal hernia   . Hyperlipidemia    borderline, controlled with diet per pt.  . IBS (irritable bowel syndrome)   . Seasonal allergies   . SVD (spontaneous vaginal delivery)    x 2  . Tubular adenoma of colon 04/2011    Past Surgical History:  Procedure Laterality Date  . COLONOSCOPY     polyps  . CYSTOSCOPY N/A 01/12/2018   Procedure: CYSTOSCOPY;  Surgeon: Megan Salon, MD;  Location: [facility];  Service: Gynecology;  Laterality: N/A;  . MAXIMUM ACCESS (MAS)POSTERIOR LUMBAR INTERBODY FUSION (PLIF) 1 LEVEL N/A 02/01/2014   Procedure: LUMBAR FIVE-SACFAL ONE POSTERIOR LUMBAR INTERBODY FUSION,GIL PROCEDURE;  Surgeon:  Erline Levine, MD;  Location: [facility];  Service: Neurosurgery;  Laterality: N/A;  . TONSILLECTOMY    . TOTAL LAPAROSCOPIC HYSTERECTOMY WITH SALPINGECTOMY Bilateral 01/12/2018   Procedure: TOTAL LAPAROSCOPIC HYSTERECTOMY WITH SALPINGECTOMY Possible BSO;  Surgeon: Megan Salon, MD;  Location: [facility];  Service: Gynecology;  Laterality: Bilateral;  possible BSO  . UPPER GI ENDOSCOPY     reflux  . WISDOM TOOTH EXTRACTION      MEDS:   Current Outpatient Medications on File Prior to Visit  Medication Sig Dispense Refill  . COLLAGEN PO Take by mouth.    . Fiber CHEW Chew 1 tablet by mouth daily.     Marland Kitchen KRILL OIL PO Take 1 capsule by mouth daily.     . Nutritional Supplements (JUICE PLUS FIBRE PO) Take 12 each by mouth daily.    Marland Kitchen omeprazole (PRILOSEC) 20 MG capsule Take 1 capsule (20 mg total) by mouth daily. 30 capsule 11  . Plant Stanol Ester (BENECOL PO) Take 4 tablets by mouth at bedtime.     . Probiotic Product (DIGESTIVE ADVANTAGE PO) Take 1 tablet by mouth daily.    Marland Kitchen VITAMIN D, ERGOCALCIFEROL, PO Take 1,000 Units by mouth daily.      No current facility-administered medications on file prior to visit.     ALLERGIES: Codeine  Family History  Problem Relation Age of Onset  . Colon cancer Paternal Grandfather   .  Diabetes Father   . Colon polyps Brother   . Breast cancer Cousin 71  . Diabetes Mother   . Hypertension Mother   . Hypertension Sister     SH:  Married, non smoker  Review of Systems  All other systems reviewed and are negative.   PHYSICAL EXAMINATION:    BP 136/80 (BP Location: Right Arm, Patient Position: Sitting, Cuff Size: Large)   Pulse 88   Resp 16   Ht 5' 2.5" (1.588 m)   Wt 134 lb (60.8 kg)   LMP 12/31/2017 (Approximate) Comment: bleeding irregular  BMI 24.12 kg/m     General appearance: alert, cooperative and appears stated age Lymph:  No inguinal LAD noted  Pelvic: External genitalia:  no lesions              Urethra:  normal appearing  urethra with no masses, tenderness or lesions              Bartholins and Skenes: normal                 Vagina: pale vaginal mucosa with scant discharge, when swabbed there is a darkish coloration to this c/w atrophic changes, no lesions; cuff well healed              Cervix: absent              Bimanual Exam:  Uterus:  uterus absent  Chaperone was present for exam.  Assessment: Vaginal atrophic changes (no evidence of fistula) with mild, intermittent discharge  Plan: Options for treatment discussed including vaginal estrogen, Vit E vaginal suppositories, Replens vaginal moisturized, olive oil and coconut oil.  She would prefer to use coconut oil.  Instructions for use given.  Pt knows to call with any new concerns/issues.

## 2018-05-22 DIAGNOSIS — H04123 Dry eye syndrome of bilateral lacrimal glands: Secondary | ICD-10-CM | POA: Diagnosis not present

## 2018-05-22 DIAGNOSIS — H40013 Open angle with borderline findings, low risk, bilateral: Secondary | ICD-10-CM | POA: Diagnosis not present

## 2018-06-12 DIAGNOSIS — S83242A Other tear of medial meniscus, current injury, left knee, initial encounter: Secondary | ICD-10-CM | POA: Diagnosis not present

## 2018-06-12 DIAGNOSIS — M25561 Pain in right knee: Secondary | ICD-10-CM | POA: Diagnosis not present

## 2018-06-23 ENCOUNTER — Telehealth: Payer: Self-pay | Admitting: Obstetrics & Gynecology

## 2018-06-23 NOTE — Telephone Encounter (Signed)
Patient returned call

## 2018-06-23 NOTE — Telephone Encounter (Signed)
Spoke with patient. Patient states she participated in a Life Screening done by [employer] for "precautionary measure" given her family Hx.   Patient states an [place] of neck was done, but they were unable to provide results or details. Was recommended to have thyroid panel drawn. Patient will receive results in mail in 17 -21 days.   Patient states she does not have any area in her neck that she can palpate, is asking if Dr. Sabra Heck is familiar with this company or process? Patient states she is concerned, is unsure if she has a thyroid abnormality or vasomotor symptoms.   Recommended OV for further evaluation if any concern, patient declined. Advised can wait for results and schedule consult once received in mail. Patient again asking what Dr. Ammie Ferrier experience with this process is and to advise?   Dr. Sabra Heck -please review.

## 2018-06-23 NOTE — Telephone Encounter (Signed)
Left message to call Milany Geck at [phone].  

## 2018-06-23 NOTE — Telephone Encounter (Signed)
Patient called requesting to speak with the nurse about concerns about her thyroid test done by [facility]. She does not have the results yet

## 2018-06-24 NOTE — Telephone Encounter (Signed)
I've had other patients do these tests.  Usually the test involved a carotid doppler (ultrasound) test.  This may have shown nodules in her thyroid.  TSH would be appropriate to screen for thyroid dysfunction but depending on what was seen, she may actually need a full thyroid ultrasound as well.  I'm happy to review her results when she gets them.  Ok to proceed with TSH if she desires or can wait until has full results that I can review.  Thanks.

## 2018-06-25 NOTE — Telephone Encounter (Signed)
Spoke with patient, advised as seen below per Dr. Sabra Heck. Patient states she believes a full thyroid US was completed along with thyroid labs. Patient declined OV at this time, will wait for results and schedule OV to discuss further at that time. Patient thankful for return call.   Routing to provider for final review. Patient is agreeable to disposition. Will close encounter.

## 2018-06-25 NOTE — Telephone Encounter (Signed)
Left message to call Dalana Pfahler at [phone].  

## 2018-07-01 ENCOUNTER — Telehealth: Payer: Self-pay | Admitting: Obstetrics & Gynecology

## 2018-07-01 DIAGNOSIS — R899 Unspecified abnormal finding in specimens from other organs, systems and tissues: Secondary | ICD-10-CM

## 2018-07-01 NOTE — Telephone Encounter (Signed)
Spoke with patient advised 02/26/18 labs with elevated CRP and calcium, repeat 4-6 wks. Patient initially rescheduled due to knee surgery. Patient states she changed doctors, knee surgery has nowbeen delayed to October. Patient states she can only schedule on Fridays.  Lab appt rescheduled for 9/20 at 3pm.   New lab orders placed as previous orders will expire.   Routing to provider for final review. Patient is agreeable to disposition. Will close encounter.

## 2018-07-01 NOTE — Telephone Encounter (Signed)
Patient canceled her upcoming lab appointment for calcium CRP 07/03/18. Patient in confused as to why she needed to have this lab drawn?Marland Kitchen

## 2018-07-03 ENCOUNTER — Other Ambulatory Visit: Payer: BLUE CROSS/BLUE SHIELD

## 2018-07-10 ENCOUNTER — Other Ambulatory Visit: Payer: Self-pay

## 2018-07-10 ENCOUNTER — Encounter: Payer: Self-pay | Admitting: Obstetrics & Gynecology

## 2018-07-10 ENCOUNTER — Ambulatory Visit: Payer: BLUE CROSS/BLUE SHIELD | Admitting: Obstetrics & Gynecology

## 2018-07-10 VITALS — BP 156/70 | HR 96 | Ht 62.5 in | Wt 129.4 lb

## 2018-07-10 DIAGNOSIS — M2550 Pain in unspecified joint: Secondary | ICD-10-CM

## 2018-07-10 DIAGNOSIS — E041 Nontoxic single thyroid nodule: Secondary | ICD-10-CM

## 2018-07-10 DIAGNOSIS — R899 Unspecified abnormal finding in specimens from other organs, systems and tissues: Secondary | ICD-10-CM | POA: Diagnosis not present

## 2018-07-10 NOTE — Progress Notes (Signed)
GYNECOLOGY  VISIT  CC:   Review outside tests and repeat blood work  HPI: 57 y.o. G2P2 [name] here to go over lab results from [employer].  She had carotid dopplers done in additional to several other tests.  When this was performed, it looks like the ultrasonographer noted thyroid cysts.  There are two images of the right side of the thyroid.  The paperwork she brought states there were abnormalities in both sides of the thyroid but there are only two pictures of the right side.  I feel she needs complete thyroid imaging as I cannot be sure of what was seen on the other side of the thyroid.  Reviewed all of the blood work and testing she had done.  The thyroid finding was the most "abnormal" of anything that was done.    Her CRP and calcium levels were elevated and she is going to have this checked.  Have recommended testing free T4 as well at [facility] today.  She is comfortable with this.  Having knee replacement with Dr. Raphael Gibney in October.    GYNECOLOGIC HISTORY: Patient's last menstrual period was 12/31/2017 (approximate). Contraception: hysterectomy  Menopausal hormone therapy: none  Patient Active Problem List   Diagnosis Date Noted  . Female pelvic pain 01/12/2018  . Endometriosis of uterus 10/29/2017  . Spondylolysis of lumbosacral region 02/01/2014    Past Medical History:  Diagnosis Date  . Adenomyosis   . Allergy    YEAR AROUND ALLERGIES  . Anemia   . Anxiety    situational per pt.  . Bursitis of hip    left  . Floaters, bilateral   . GERD (gastroesophageal reflux disease)   . Hemorrhoids   . Hiatal hernia   . Hyperlipidemia    borderline, controlled with diet per pt.  . IBS (irritable bowel syndrome)   . Seasonal allergies   . SVD (spontaneous vaginal delivery)    x 2  . Tubular adenoma of colon 04/2011    Past Surgical History:  Procedure Laterality Date  . COLONOSCOPY     polyps  . CYSTOSCOPY N/A 01/12/2018   Procedure:  CYSTOSCOPY;  Surgeon: Megan Salon, MD;  Location: [facility];  Service: Gynecology;  Laterality: N/A;  . MAXIMUM ACCESS (MAS)POSTERIOR LUMBAR INTERBODY FUSION (PLIF) 1 LEVEL N/A 02/01/2014   Procedure: LUMBAR FIVE-SACFAL ONE POSTERIOR LUMBAR INTERBODY FUSION,GIL PROCEDURE;  Surgeon: Erline Levine, MD;  Location: [facility];  Service: Neurosurgery;  Laterality: N/A;  . TONSILLECTOMY    . TOTAL LAPAROSCOPIC HYSTERECTOMY WITH SALPINGECTOMY Bilateral 01/12/2018   Procedure: TOTAL LAPAROSCOPIC HYSTERECTOMY WITH SALPINGECTOMY Possible BSO;  Surgeon: Megan Salon, MD;  Location: [facility];  Service: Gynecology;  Laterality: Bilateral;  possible BSO  . UPPER GI ENDOSCOPY     reflux  . WISDOM TOOTH EXTRACTION      MEDS:   Current Outpatient Medications on File Prior to Visit  Medication Sig Dispense Refill  . COLLAGEN PO Take by mouth.    . Fiber CHEW Chew 1 tablet by mouth daily.     Marland Kitchen KRILL OIL PO Take 1 capsule by mouth daily.     . Nutritional Supplements (JUICE PLUS FIBRE PO) Take 12 each by mouth daily.    Marland Kitchen omeprazole (PRILOSEC) 20 MG capsule Take 1 capsule (20 mg total) by mouth daily. 30 capsule 11  . Plant Stanol Ester (BENECOL PO) Take 4 tablets by mouth at bedtime.     . Probiotic Product (DIGESTIVE ADVANTAGE  PO) Take 1 tablet by mouth daily.    . Turmeric 500 MG CAPS Take by mouth daily.    Marland Kitchen VITAMIN D, ERGOCALCIFEROL, PO Take 1,000 Units by mouth daily.      No current facility-administered medications on file prior to visit.     ALLERGIES: Codeine  Family History  Problem Relation Age of Onset  . Colon cancer Paternal Grandfather   . Diabetes Father   . Colon polyps Brother   . Breast cancer Cousin 36  . Diabetes Mother   . Hypertension Mother   . Hypertension Sister     SH:  Married, non smoker  Review of Systems  All other systems reviewed and are negative.   PHYSICAL EXAMINATION:    BP (!) 156/70 (BP Location: Right Arm, Patient Position: Sitting, Cuff Size:  Normal)   Pulse 96   Ht 5' 2.5" (1.588 m)   Wt 129 lb 6.4 oz (58.7 kg)   LMP 12/31/2017 (Approximate) Comment: bleeding irregular  BMI 23.29 kg/m     General appearance: alert, cooperative and appears stated age No exam was performed  Assessment: Abnormal thyroid imaging, but only partial, with [facility] screening (possible cysts) Previous elevated CRP and elevated calcium level  Plan: Thyroid ultrasound will be scheduled CRP, CMP, PTH and Free T4 obtained today as well.  Results and recommendations will be communicated to pt.   ~15 minutes spent with patient >50% of time was in face to face discussion of above.

## 2018-07-10 NOTE — Progress Notes (Signed)
Patient scheduled for thyroid ultrasound on 07/17/18 at 1010 at [facility] at [address], [place] [address].

## 2018-07-11 LAB — COMPREHENSIVE METABOLIC PANEL
ALT: 13 IU/L (ref 0–32)
AST: 17 IU/L (ref 0–40)
Albumin/Globulin Ratio: 2.4 — ABNORMAL HIGH (ref 1.2–2.2)
Albumin: 4.6 g/dL (ref 3.5–5.5)
Alkaline Phosphatase: 70 IU/L (ref 39–117)
BUN/Creatinine Ratio: 20 (ref 9–23)
BUN: 17 mg/dL (ref 6–24)
Bilirubin Total: 0.2 mg/dL (ref 0.0–1.2)
CALCIUM: 9.9 mg/dL (ref 8.7–10.2)
CO2: 25 mmol/L (ref 20–29)
Chloride: 102 mmol/L (ref 96–106)
Creatinine, Ser: 0.85 mg/dL (ref 0.57–1.00)
GFR, EST AFRICAN AMERICAN: 89 mL/min/{1.73_m2} (ref >59)
GFR, EST NON AFRICAN AMERICAN: 77 mL/min/{1.73_m2} (ref >59)
Globulin, Total: 1.9 g/dL (ref 1.5–4.5)
Glucose: 134 mg/dL — ABNORMAL HIGH (ref 65–99)
Potassium: 3.9 mmol/L (ref 3.5–5.2)
Sodium: 142 mmol/L (ref 134–144)
TOTAL PROTEIN: 6.5 g/dL (ref 6.0–8.5)

## 2018-07-11 LAB — C-REACTIVE PROTEIN: CRP: 3 mg/L (ref 0–10)

## 2018-07-11 LAB — T4, FREE: Free T4: 1.33 ng/dL (ref 0.82–1.77)

## 2018-07-11 LAB — PARATHYROID HORMONE, INTACT (NO CA): PTH: 36 pg/mL (ref 15–65)

## 2018-07-17 ENCOUNTER — Ambulatory Visit
Admission: RE | Admit: 2018-07-17 | Discharge: 2018-07-17 | Disposition: A | Discharge status: Home / Self Care | Payer: BLUE CROSS/BLUE SHIELD | Source: Ambulatory Visit | Attending: Obstetrics & Gynecology | Admitting: Obstetrics & Gynecology

## 2018-07-17 DIAGNOSIS — E041 Nontoxic single thyroid nodule: Secondary | ICD-10-CM | POA: Diagnosis not present

## 2018-07-28 DIAGNOSIS — M23321 Other meniscus derangements, posterior horn of medial meniscus, right knee: Secondary | ICD-10-CM | POA: Diagnosis not present

## 2018-08-17 ENCOUNTER — Telehealth: Payer: Self-pay | Admitting: Obstetrics & Gynecology

## 2018-08-17 NOTE — Telephone Encounter (Signed)
Patient would like to come in and discuss other options for prolapse.

## 2018-08-17 NOTE — Telephone Encounter (Signed)
Left message to call Nic Lampe, RN at [facility] [phone].   

## 2018-08-18 NOTE — Telephone Encounter (Signed)
Spoke with patient.   1.Requesting OV to discuss tx options for prolapse. Patient states she is s/p knee surgery 07/28/18, was prescribed Robaxin 500mg  to help loosen muscles in knee. Patient states she felt the Robaxin was making prolapse worse Kegel exercises not helping and PT did not help in past. She was only taking Robaxin 1/2 tab at hs, has since stopped. Symptoms have improved "5%", unsure if related. OV scheduled for consult on 11/22 at 11:15am with Dr. Sabra Heck. Patient declined earlier OV offered, has f/u with ortho 11/4.  2. Patient asking if Robaxin can make prolapse worse by relaxing pelvic muscles? She fees like she needs to take the medication, but does not want to "make prolapse worse".   Advised will review with Dr. Wallis Mart and return call.   Dr. Sabra Heck -please review and advise.

## 2018-08-18 NOTE — Telephone Encounter (Signed)
Patient returning call to Jill. °

## 2018-08-19 ENCOUNTER — Telehealth: Payer: Self-pay | Admitting: Obstetrics & Gynecology

## 2018-08-19 NOTE — Telephone Encounter (Signed)
Please let her know I reviewed the information about prolapse and causes as well as robaxin and can't find any connection.  I think ok to take esp until upcoming appt.

## 2018-08-19 NOTE — Telephone Encounter (Signed)
Spoke with patient, advised of recommendations per Dr. Sabra Heck. See closed encounter dated 08/17/18. Patient verbalizes understanding and is agreeable. Encounter closed.

## 2018-08-19 NOTE — Telephone Encounter (Signed)
Patient returning [place] call. No open phone note.

## 2018-08-19 NOTE — Telephone Encounter (Signed)
Left deatiled message, ok per dpr. Advised as seen below per Dr. Sabra Heck. Return call to office if any additional questions. Encounter closed.

## 2018-08-25 DIAGNOSIS — M25562 Pain in left knee: Secondary | ICD-10-CM | POA: Insufficient documentation

## 2018-09-11 ENCOUNTER — Ambulatory Visit: Payer: Self-pay | Admitting: Obstetrics & Gynecology

## 2018-10-09 ENCOUNTER — Ambulatory Visit: Payer: Self-pay | Admitting: Obstetrics & Gynecology

## 2018-11-02 ENCOUNTER — Telehealth: Payer: Self-pay | Admitting: Obstetrics & Gynecology

## 2018-11-02 NOTE — Telephone Encounter (Signed)
Patient cancelled appointment for Friday. Patient stated that she wanted to give it some more time, as she feels like things are starting to get a little better. Patient stated that she might call back to reschedule at a later time.  Routing to Dr. Sabra Heck for [place].

## 2018-11-06 ENCOUNTER — Ambulatory Visit: Payer: Self-pay | Admitting: Obstetrics & Gynecology

## 2018-11-12 ENCOUNTER — Other Ambulatory Visit: Payer: Self-pay | Admitting: Obstetrics & Gynecology

## 2018-11-12 DIAGNOSIS — Z1231 Encounter for screening mammogram for malignant neoplasm of breast: Secondary | ICD-10-CM

## 2018-12-18 ENCOUNTER — Ambulatory Visit: Payer: BLUE CROSS/BLUE SHIELD

## 2018-12-25 ENCOUNTER — Ambulatory Visit: Payer: BLUE CROSS/BLUE SHIELD

## 2019-01-22 ENCOUNTER — Ambulatory Visit: Payer: Self-pay

## 2019-02-03 ENCOUNTER — Other Ambulatory Visit: Payer: Self-pay

## 2019-02-03 MED ORDER — OMEPRAZOLE 20 MG PO CPDR: 20.0000 mg | DELAYED_RELEASE_CAPSULE | Freq: Every day | ORAL | 0 refills | Status: DC

## 2019-02-19 ENCOUNTER — Telehealth: Payer: Self-pay | Admitting: Gastroenterology

## 2019-02-19 MED ORDER — OMEPRAZOLE 20 MG PO CPDR: 20.0000 mg | DELAYED_RELEASE_CAPSULE | Freq: Every day | ORAL | 0 refills | Status: DC

## 2019-02-19 NOTE — Telephone Encounter (Signed)
Pt calling back states the manufacturer is called [employer] for Omeprazole.

## 2019-02-19 NOTE — Telephone Encounter (Signed)
Patient states she took the last prescription for omeprazole from [facility] for one week and can tell a difference on how it made her feel. Patient state she called and they told her it was a [employer] representative but patient does not recall the name. Patient states she would like a new prescription sent to the pharmacy stating she can only have the manufacturer that works best. Informed patient to find out the name of the manufacturer and call me back so I can send the prescription with that in the comments. Patient verbalized understanding.

## 2019-02-19 NOTE — Telephone Encounter (Signed)
Per patients request prescription sent to [facility] stating patient can only have [employer] manufacture for omeprazole.

## 2019-02-19 NOTE — Telephone Encounter (Signed)
Pt stated that [facility] gave her a different manufacture for omeprazole that does not work as well.  Pt would like to have a new prescription for omeprazole specifying the manufacture that works for her; she requested a call back.

## 2019-03-19 ENCOUNTER — Ambulatory Visit: Payer: Self-pay

## 2019-04-03 ENCOUNTER — Ambulatory Visit
Admission: RE | Admit: 2019-04-03 | Discharge: 2019-04-03 | Disposition: A | Discharge status: Home / Self Care | Payer: Managed Care, Other (non HMO) | Source: Ambulatory Visit | Attending: Obstetrics & Gynecology | Admitting: Obstetrics & Gynecology

## 2019-04-03 ENCOUNTER — Other Ambulatory Visit: Payer: Self-pay

## 2019-04-03 DIAGNOSIS — Z1231 Encounter for screening mammogram for malignant neoplasm of breast: Secondary | ICD-10-CM

## 2019-05-05 ENCOUNTER — Other Ambulatory Visit: Payer: Self-pay | Admitting: Gastroenterology

## 2019-05-18 ENCOUNTER — Telehealth: Payer: Self-pay | Admitting: Obstetrics & Gynecology

## 2019-05-18 NOTE — Telephone Encounter (Signed)
Left message on voicemail to call and reschedule cancelled appointment. °

## 2019-05-21 ENCOUNTER — Encounter

## 2019-05-21 ENCOUNTER — Ambulatory Visit: Payer: Self-pay | Admitting: Obstetrics & Gynecology

## 2019-05-28 ENCOUNTER — Encounter: Payer: Self-pay | Admitting: Gastroenterology

## 2019-05-28 ENCOUNTER — Ambulatory Visit: Payer: Managed Care, Other (non HMO) | Admitting: Gastroenterology

## 2019-05-28 VITALS — BP 138/78 | HR 88 | Temp 98.3°F | Ht 62.25 in | Wt 134.0 lb

## 2019-05-28 DIAGNOSIS — M1990 Unspecified osteoarthritis, unspecified site: Secondary | ICD-10-CM | POA: Insufficient documentation

## 2019-05-28 DIAGNOSIS — K219 Gastro-esophageal reflux disease without esophagitis: Secondary | ICD-10-CM | POA: Diagnosis not present

## 2019-05-28 MED ORDER — OMEPRAZOLE 20 MG PO CPDR: 20.0000 mg | DELAYED_RELEASE_CAPSULE | Freq: Every day | ORAL | 3 refills | Status: DC

## 2019-05-28 NOTE — Progress Notes (Signed)
05/28/2019 Shelly Gilbert [id number] May 29, 1961   HISTORY OF PRESENT ILLNESS: This is a pleasant 58 year old female who is a patient of Dr. Lynne Leader.  She is here today just for follow-up of her reflux.  She is currently on omeprazole 20 mg daily and symptoms are well controlled with that medication.  She is asking for refills.  While she is here she also describes what sounds like a rectocele.  She says that her rectum feels like it protrudes into her vaginal wall.  She has an upcoming visit with her gynecologist and so we will discuss further with them, but is not interested in surgical intervention.  She says that all of her life she had been constipated and was using fiber.  She had a hysterectomy last year and since then her hormones have been all over the place and have caused her to have changes in her bowel habits that make her normal regimen with her fiber supplements to now cause her some loose stools.  She says that she thinks between her diet and taking a fiber tablet every other day has allowed her better regularity now, but she walks a thin line with trying to keep her stools "normal".  She says the the rectocele she believes also plays a role in how well her bowels move and also her bowels moving affects the degree of the rectocele as well.  She is UTD with colonoscopy.  Past Medical History:  Diagnosis Date  . Adenomyosis   . Allergy    YEAR AROUND ALLERGIES  . Anemia   . Anxiety    situational per pt.  . Bursitis of hip    left  . Floaters, bilateral   . GERD (gastroesophageal reflux disease)   . Hemorrhoids   . Hiatal hernia   . Hyperlipidemia    borderline, controlled with diet per pt.  . IBS (irritable bowel syndrome)   . Seasonal allergies   . SVD (spontaneous vaginal delivery)    x 2  . Tubular adenoma of colon 04/2011   Past Surgical History:  Procedure Laterality Date  . COLONOSCOPY     polyps  . CYSTOSCOPY N/A 01/12/2018   Procedure: CYSTOSCOPY;   Surgeon: Megan Salon, MD;  Location: [facility];  Service: Gynecology;  Laterality: N/A;  . MAXIMUM ACCESS (MAS)POSTERIOR LUMBAR INTERBODY FUSION (PLIF) 1 LEVEL N/A 02/01/2014   Procedure: LUMBAR FIVE-SACFAL ONE POSTERIOR LUMBAR INTERBODY FUSION,GIL PROCEDURE;  Surgeon: Erline Levine, MD;  Location: [facility];  Service: Neurosurgery;  Laterality: N/A;  . MENISCUS REPAIR Bilateral    R 07/2018, L 09/2018  . TONSILLECTOMY    . TOTAL LAPAROSCOPIC HYSTERECTOMY WITH SALPINGECTOMY Bilateral 01/12/2018   Procedure: TOTAL LAPAROSCOPIC HYSTERECTOMY WITH SALPINGECTOMY Possible BSO;  Surgeon: Megan Salon, MD;  Location: [facility];  Service: Gynecology;  Laterality: Bilateral;  possible BSO  . UPPER GI ENDOSCOPY     reflux  . WISDOM TOOTH EXTRACTION      reports that she has never smoked. She has never used smokeless tobacco. She reports current alcohol use. She reports that she does not use drugs. family history includes Breast cancer (age of onset: 3) in her cousin; Colon cancer in her paternal grandfather; Colon polyps in her brother; Diabetes in her father and mother; Hypertension in her mother and sister. Allergies  Allergen Reactions  . Codeine Other (See Comments)    Severe nightmares      Outpatient Encounter Medications as of 05/28/2019  Medication Sig  .  COLLAGEN PO Take by mouth.  Marland Kitchen eplerenone (INSPRA) 25 MG tablet Take 0.5 tablets by mouth daily.  . Fiber CHEW Chew 1 tablet by mouth daily.   Marland Kitchen KRILL OIL PO Take 1 capsule by mouth daily.   . Nutritional Supplements (JUICE PLUS FIBRE PO) Take 12 each by mouth daily.  Marland Kitchen omeprazole (PRILOSEC) 20 MG capsule Take 1 capsule (20 mg total) by mouth daily.  . Plant Stanol Ester (BENECOL PO) Take 4 tablets by mouth at bedtime.   . Probiotic Product (DIGESTIVE ADVANTAGE PO) Take 1 tablet by mouth daily.  Marland Kitchen tretinoin (RETIN-A) 0.025 % cream tretinoin 0.025 % topical cream  APPLY CREAM TOPICALLY TO AFFECTED AREA AT BEDTIME  . Turmeric 500 MG CAPS  Take by mouth daily.  Marland Kitchen VITAMIN D, ERGOCALCIFEROL, PO Take 1,000 Units by mouth daily.    No facility-administered encounter medications on file as of 05/28/2019.      REVIEW OF SYSTEMS  : All other systems reviewed and negative except where noted in the History of Present Illness.   PHYSICAL EXAM: BP 138/78 (BP Location: Left Arm, Patient Position: Sitting, Cuff Size: Normal)   Pulse 88   Temp 98.3 F (36.8 C)   Ht 5' 2.25" (1.581 m)   Wt 134 lb (60.8 kg)   LMP 12/31/2017 (Approximate) Comment: bleeding irregular  BMI 24.31 kg/m  General: Well developed white female in no acute distress Head: Normocephalic and atraumatic Eyes:  Sclerae anicteric, conjunctiva pink. Ears: Normal auditory acuity Lungs: Clear throughout to auscultation; no increased WOB. Heart: Regular rate and rhythm; no M/R/G. Abdomen: Soft, non-distended.  BS present.  Non-tender. Musculoskeletal: Symmetrical with no gross deformities  Skin: No lesions on visible extremities Extremities: No edema  Neurological: Alert oriented x 4, grossly non-focal Psychological:  Alert and cooperative. Normal mood and affect  ASSESSMENT AND PLAN: *GERD: Well-controlled on omeprazole 20 mg daily.  Will continue and will send refill for 90-day supply. *Rectocele:  Saying that feels like her rectum protrudes into her vaginal wall.  She has an upcoming visit with her gynecologist and will discuss this with them although she is not interested in surgical intervention.  CC:  Shirline Frees, MD

## 2019-05-28 NOTE — Patient Instructions (Signed)
We have sent the following medications to your pharmacy for you to pick up at your convenience: Omeprazole 20 mg daily  If you are age 57 or older, your body mass index should be between 23-30. Your Body mass index is 24.31 kg/m. If this is out of the aforementioned range listed, please consider follow up with your Primary Care Provider.  If you are age 73 or younger, your body mass index should be between 19-25. Your Body mass index is 24.31 kg/m. If this is out of the aformentioned range listed, please consider follow up with your Primary Care Provider.

## 2019-05-28 NOTE — Progress Notes (Signed)
Reviewed and agree with management plan.  [place] T. Kevonta Phariss, MD FACG 

## 2019-06-04 ENCOUNTER — Ambulatory Visit: Payer: Self-pay | Admitting: Obstetrics & Gynecology

## 2019-07-13 ENCOUNTER — Other Ambulatory Visit: Payer: Self-pay

## 2019-07-15 ENCOUNTER — Encounter: Payer: Self-pay | Admitting: Obstetrics & Gynecology

## 2019-07-15 ENCOUNTER — Ambulatory Visit: Payer: Managed Care, Other (non HMO) | Admitting: Obstetrics & Gynecology

## 2019-07-15 ENCOUNTER — Other Ambulatory Visit: Payer: Self-pay

## 2019-07-15 VITALS — BP 142/80 | HR 80 | Temp 97.1°F | Ht 62.25 in | Wt 132.0 lb

## 2019-07-15 DIAGNOSIS — N816 Rectocele: Secondary | ICD-10-CM | POA: Diagnosis not present

## 2019-07-15 NOTE — Progress Notes (Signed)
GYNECOLOGY  VISIT  CC:   Follow up prolapse   HPI: 58 y.o. G2P2 [name] here for rectocele follow up and to discuss concerns about hemorrhoids.  After having hysterectomy 3/19, she felt more symptoms of rectal pressure that is worse with activity/extended standing/yard work.  Does not have any issues with bowel movements or stool incontinence but does report issues with hemorrhoids.  When this occurs, she feels more rectal pressure.  From urinary standpoint, she rarely leaks urine.  Reports this has been ongoing for years since after birth of children.  Does not feel this is significant enough to warrant treatment.  Would like to proceed with additional evaluation.  Questions whether hemorrhoid treatment will help with rectocele symptoms.      Pt did go for pelvic PT.  Didn't really think this helped much.  Asked about pt's knee as this has been an on-going issues for her.  She's had three gel injections in her knee.  Her left knee is better.  The right knee has a lot of arthritis in it and the injections have helped just a little.  She's not sure that this is really going to help in the long term.  Has discussed having a right knee replacement but not ready for this if possible.  GYNECOLOGIC HISTORY: Patient's last menstrual period was 12/31/2017 (approximate). Contraception: Hysterectomy Menopausal hormone therapy: none  Patient Active Problem List   Diagnosis Date Noted  . Gastroesophageal reflux disease 05/28/2019  . Spondylolysis of lumbosacral region 02/01/2014    Past Medical History:  Diagnosis Date  . Adenomyosis   . Allergy    YEAR AROUND ALLERGIES  . Anemia   . Anxiety    situational per pt.  . Bursitis of hip    left  . Floaters, bilateral   . GERD (gastroesophageal reflux disease)   . Hemorrhoids   . Hiatal hernia   . Hyperlipidemia    borderline, controlled with diet per pt.  . IBS (irritable bowel syndrome)   . Seasonal allergies   .  SVD (spontaneous vaginal delivery)    x 2  . Tubular adenoma of colon 04/2011    Past Surgical History:  Procedure Laterality Date  . COLONOSCOPY     polyps  . CYSTOSCOPY N/A 01/12/2018   Procedure: CYSTOSCOPY;  Surgeon: Megan Salon, MD;  Location: [facility];  Service: Gynecology;  Laterality: N/A;  . MAXIMUM ACCESS (MAS)POSTERIOR LUMBAR INTERBODY FUSION (PLIF) 1 LEVEL N/A 02/01/2014   Procedure: LUMBAR FIVE-SACFAL ONE POSTERIOR LUMBAR INTERBODY FUSION,GIL PROCEDURE;  Surgeon: Erline Levine, MD;  Location: [facility];  Service: Neurosurgery;  Laterality: N/A;  . MENISCUS REPAIR Bilateral    R 07/2018, L 09/2018  . TONSILLECTOMY    . TOTAL LAPAROSCOPIC HYSTERECTOMY WITH SALPINGECTOMY Bilateral 01/12/2018   Procedure: TOTAL LAPAROSCOPIC HYSTERECTOMY WITH SALPINGECTOMY Possible BSO;  Surgeon: Megan Salon, MD;  Location: [facility];  Service: Gynecology;  Laterality: Bilateral;  possible BSO  . UPPER GI ENDOSCOPY     reflux  . WISDOM TOOTH EXTRACTION      MEDS:   Current Outpatient Medications on File Prior to Visit  Medication Sig Dispense Refill  . COLLAGEN PO Take by mouth.    Marland Kitchen eplerenone (INSPRA) 25 MG tablet Take 0.5 tablets by mouth daily.    . Fiber CHEW Chew 1 tablet by mouth daily.     Marland Kitchen KRILL OIL PO Take 1 capsule by mouth daily.     . Multiple Vitamin (  MULTIVITAMIN) tablet Take 1 tablet by mouth daily.    Marland Kitchen omeprazole (PRILOSEC) 20 MG capsule Take 1 capsule (20 mg total) by mouth daily. 90 capsule 3  . Probiotic Product (DIGESTIVE ADVANTAGE PO) Take 1 tablet by mouth daily.    Marland Kitchen tretinoin (RETIN-A) 0.025 % cream tretinoin 0.025 % topical cream  APPLY CREAM TOPICALLY TO AFFECTED AREA AT BEDTIME    . Turmeric 500 MG CAPS Take by mouth daily.    Marland Kitchen VITAMIN D, ERGOCALCIFEROL, PO Take 1,000 Units by mouth daily.      No current facility-administered medications on file prior to visit.     ALLERGIES: Codeine  Family History  Problem Relation Age of Onset  . Colon cancer  Paternal Grandfather   . Diabetes Father   . Colon polyps Brother   . Breast cancer Cousin 98  . Diabetes Mother   . Hypertension Mother   . Hypertension Sister     SH:  Married, non smoker  Review of Systems  All other systems reviewed and are negative.   PHYSICAL EXAMINATION:    BP (!) 142/80   Pulse 80   Temp (!) 97.1 F (36.2 C) (Temporal)   Ht 5' 2.25" (1.581 m)   Wt 132 lb (59.9 kg)   LMP 12/31/2017 (Approximate) Comment: bleeding irregular  BMI 23.95 kg/m     General appearance: alert, cooperative and appears stated age Abdomen: soft, non-tender; bowel sounds normal; no masses,  no organomegaly Lymph:  no inguinal LAD noted  Pelvic: External genitalia:  no lesions              Urethra:  normal appearing urethra with no masses, tenderness or lesions              Bartholins and Skenes: normal                 Vagina: normal appearing vagina with normal color and discharge, no lesions, 2nd degree rectocele noted with Valsalva              Cervix: absent              Bimanual Exam:  Uterus:  uterus absent              Adnexa: no mass, fullness, tenderness              Rectovaginal: Yes.    On RVE, there is a midline weakness of the posterior vaginal wall, low towards rectum; no hemorrhoids noted today (but pt states they aren't bothering her today)              Anus:  normal sphincter tone, no lesions  Chaperone was present for exam.  Assessment: 2nd degree rectocele Hemorrhoids  Plan: Pt does not want to undergo future surgery if not really sure it will help.  She is aware I've seen much more significant rectocele findings in other patients that do not seem to cause as much symptoms as her rectocele causes.  Also, she does not when hemorrhoids are bothersome, rectocele symptoms are worse.  As she does not want to proceed with repair if not sure it will really help symptoms, I question whether treatment of probable internal hemorrhoids would help enough to help  symptoms.  Will refer to Dr. Marcello Moores for her opinion.  Pt comfortable with plan.  Desires as much information as possible prior to proceeding with any additional surgeries.   ~20 minutes spent with patient >50% of time was in face  to face discussion of above.

## 2019-08-06 DIAGNOSIS — H02831 Dermatochalasis of right upper eyelid: Secondary | ICD-10-CM | POA: Insufficient documentation

## 2019-10-07 DIAGNOSIS — M79641 Pain in right hand: Secondary | ICD-10-CM | POA: Insufficient documentation

## 2020-01-19 ENCOUNTER — Other Ambulatory Visit: Payer: Self-pay | Admitting: Family Medicine

## 2020-01-19 DIAGNOSIS — E042 Nontoxic multinodular goiter: Secondary | ICD-10-CM

## 2020-01-28 ENCOUNTER — Ambulatory Visit
Admission: RE | Admit: 2020-01-28 | Discharge: 2020-01-28 | Disposition: A | Discharge status: Home / Self Care | Payer: Managed Care, Other (non HMO) | Source: Ambulatory Visit | Attending: Family Medicine | Admitting: Family Medicine

## 2020-01-28 DIAGNOSIS — E042 Nontoxic multinodular goiter: Secondary | ICD-10-CM

## 2020-02-14 ENCOUNTER — Encounter (INDEPENDENT_AMBULATORY_CARE_PROVIDER_SITE_OTHER): Payer: Managed Care, Other (non HMO) | Admitting: Ophthalmology

## 2020-02-28 ENCOUNTER — Encounter (INDEPENDENT_AMBULATORY_CARE_PROVIDER_SITE_OTHER): Payer: Managed Care, Other (non HMO) | Admitting: Ophthalmology

## 2020-03-10 ENCOUNTER — Other Ambulatory Visit: Payer: Self-pay | Admitting: Obstetrics & Gynecology

## 2020-03-10 DIAGNOSIS — Z1231 Encounter for screening mammogram for malignant neoplasm of breast: Secondary | ICD-10-CM

## 2020-04-06 ENCOUNTER — Other Ambulatory Visit: Payer: Self-pay

## 2020-04-06 ENCOUNTER — Ambulatory Visit
Admission: RE | Admit: 2020-04-06 | Discharge: 2020-04-06 | Disposition: A | Discharge status: Home / Self Care | Payer: Managed Care, Other (non HMO) | Source: Ambulatory Visit | Attending: Obstetrics & Gynecology | Admitting: Obstetrics & Gynecology

## 2020-04-06 DIAGNOSIS — Z1231 Encounter for screening mammogram for malignant neoplasm of breast: Secondary | ICD-10-CM

## 2020-05-14 ENCOUNTER — Other Ambulatory Visit: Payer: Self-pay | Admitting: Gastroenterology

## 2020-06-06 ENCOUNTER — Other Ambulatory Visit: Payer: Self-pay

## 2020-06-06 ENCOUNTER — Encounter (INDEPENDENT_AMBULATORY_CARE_PROVIDER_SITE_OTHER): Payer: Self-pay | Admitting: Ophthalmology

## 2020-06-06 ENCOUNTER — Ambulatory Visit (INDEPENDENT_AMBULATORY_CARE_PROVIDER_SITE_OTHER): Payer: Managed Care, Other (non HMO) | Admitting: Ophthalmology

## 2020-06-06 DIAGNOSIS — H35722 Serous detachment of retinal pigment epithelium, left eye: Secondary | ICD-10-CM

## 2020-06-06 DIAGNOSIS — H35713 Central serous chorioretinopathy, bilateral: Secondary | ICD-10-CM | POA: Insufficient documentation

## 2020-06-06 DIAGNOSIS — H35712 Central serous chorioretinopathy, left eye: Secondary | ICD-10-CM | POA: Diagnosis not present

## 2020-06-06 NOTE — Progress Notes (Signed)
06/06/2020     CHIEF COMPLAINT Patient presents for Retina Follow Up   HISTORY OF PRESENT ILLNESS: Shelly Gilbert is a 59 y.o. female who presents to the clinic today for:   HPI    Retina Follow Up    Patient presents with  Other.  In both eyes.  This started 9 months ago.  Severity is mild.  Duration of 9 months.  Since onset it is stable.          Comments    9 Month F/U OU  Pt denies noticeable changes to [place] OU since last visit. Pt sts, "I don't think it's where it needs to be" OS. Pt denies ocular pain, flashes of light, or floaters OU.          Last edited by Rockie Neighbours, [place] on 06/06/2020  3:28 PM. (History)      Referring physician: Shirline Frees, MD [address],  [place] [address]  HISTORICAL INFORMATION:   Selected notes from the [facility]: No current outpatient medications on file. (Ophthalmic Drugs)   No current facility-administered medications for this visit. (Ophthalmic Drugs)   Current Outpatient Medications (Other)  Medication Sig   COLLAGEN PO Take by mouth.   eplerenone (INSPRA) 25 MG tablet Take 0.5 tablets by mouth daily.   Fiber CHEW Chew 1 tablet by mouth daily.    KRILL OIL PO Take 1 capsule by mouth daily.    Multiple Vitamin (MULTIVITAMIN) tablet Take 1 tablet by mouth daily.   omeprazole (PRILOSEC) 20 MG capsule TAKE 1 CAPSULE DAILY   Probiotic Product (DIGESTIVE ADVANTAGE PO) Take 1 tablet by mouth daily.   tretinoin (RETIN-A) 0.025 % cream tretinoin 0.025 % topical cream  APPLY CREAM TOPICALLY TO AFFECTED AREA AT BEDTIME   Turmeric 500 MG CAPS Take by mouth daily.   VITAMIN D, ERGOCALCIFEROL, PO Take 1,000 Units by mouth daily.    No current facility-administered medications for this visit. (Other)      REVIEW OF SYSTEMS:    ALLERGIES Allergies  Allergen Reactions   Codeine Other (See Comments)    Severe nightmares    PAST MEDICAL  HISTORY Past Medical History:  Diagnosis Date   Adenomyosis    Allergy    YEAR AROUND ALLERGIES   Anemia    Anxiety    situational per pt.   Bursitis of hip    left   Floaters, bilateral    GERD (gastroesophageal reflux disease)    Hemorrhoids    Hiatal hernia    Hyperlipidemia    borderline, controlled with diet per pt.   IBS (irritable bowel syndrome)    Seasonal allergies    SVD (spontaneous vaginal delivery)    x 2   Tubular adenoma of colon 04/2011   Past Surgical History:  Procedure Laterality Date   COLONOSCOPY     polyps   CYSTOSCOPY N/A 01/12/2018   Procedure: CYSTOSCOPY;  Surgeon: Megan Salon, MD;  Location: [facility];  Service: Gynecology;  Laterality: N/A;   MAXIMUM ACCESS (MAS)POSTERIOR LUMBAR INTERBODY FUSION (PLIF) 1 LEVEL N/A 02/01/2014   Procedure: LUMBAR FIVE-SACFAL ONE POSTERIOR LUMBAR INTERBODY FUSION,GIL PROCEDURE;  Surgeon: Erline Levine, MD;  Location: [facility];  Service: Neurosurgery;  Laterality: N/A;   MENISCUS REPAIR Bilateral    R 07/2018, L 09/2018   TONSILLECTOMY     TOTAL LAPAROSCOPIC HYSTERECTOMY WITH SALPINGECTOMY Bilateral 01/12/2018   Procedure: TOTAL  LAPAROSCOPIC HYSTERECTOMY WITH SALPINGECTOMY Possible BSO;  Surgeon: Megan Salon, MD;  Location: [facility];  Service: Gynecology;  Laterality: Bilateral;  possible BSO   UPPER GI ENDOSCOPY     reflux   WISDOM TOOTH EXTRACTION      FAMILY HISTORY Family History  Problem Relation Age of Onset   Colon cancer Paternal Grandfather    Diabetes Father    Colon polyps Brother    Breast cancer Cousin 34   Diabetes Mother    Hypertension Mother    Hypertension Sister     SOCIAL HISTORY Social History   Tobacco Use   Smoking status: Never Smoker   Smokeless tobacco: Never Used  Scientific laboratory technician Use: Never used  Substance Use Topics   Alcohol use: Yes    Comment: social wine   Drug use: No         OPHTHALMIC EXAM:  Base Eye Exam    Visual  Acuity (ETDRS)      Right Left   Dist cc 20/20 -1 20/40 +2   Dist ph cc  20/20 -1   Correction: Glasses       Tonometry (Tonopen, 3:29 PM)      Right Left   Pressure 17 15       Pupils      Pupils Dark Light Shape React APD   Right PERRL 4 3 Round Brisk None   Left PERRL 4 3 Round Brisk None       Visual Fields (Counting fingers)      Left Right    Full Full       Extraocular Movement      Right Left    Full Full       Neuro/Psych    Oriented x3: Yes   Mood/Affect: Normal       Dilation    Both eyes: 1.0% Mydriacyl, 2.5% Phenylephrine @ 3:32 PM        Slit Lamp and Fundus Exam    External Exam      Right Left   External Normal Normal       Slit Lamp Exam      Right Left   Lids/Lashes Normal Normal   Conjunctiva/Sclera White and quiet White and quiet   Cornea Clear Clear   Anterior Chamber Deep and quiet Deep and quiet   Iris Round and reactive Round and reactive   Lens 1+ Nuclear sclerosis 1+ Nuclear sclerosis   Anterior Vitreous Normal Normal       Fundus Exam      Right Left   Posterior Vitreous Normal Normal   Disc Normal Normal   C/D Ratio 0.45 0.45   Macula Normal Retinal pigment epithelial mottling   Vessels Normal Normal   Periphery Normal Normal          IMAGING AND PROCEDURES  Imaging and Procedures for 06/06/20  OCT, Retina - OU - Both Eyes       Right Eye Quality was good. Scan locations included subfoveal. Central Foveal Thickness: 259. Findings include normal observations, no SRF.   Left Eye Quality was good. Scan locations included subfoveal. Central Foveal Thickness: 231. Progression has improved. Findings include no SRF, subretinal hyper-reflective material.   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                ASSESSMENT/PLAN:  Central serous chorioretinopathy of left eye  Vision OS has  resolved at present, there is no active disease, the patient reports slowly improving sense of color in the left eye      ICD-10-CM   1. Central serous chorioretinopathy of left eye  H35.712 OCT, Retina - OU - Both Eyes  2. Serous detachment of retinal pigment epithelium of left eye  H35.722 OCT, Retina - OU - Both Eyes    1.  2.  3.  Ophthalmic Meds Ordered this visit:  No orders of the defined types were placed in this encounter.      Return in about 9 months (around 03/06/2021) for DILATE OU, OCT.  There are no Patient Instructions on file for this visit.   Explained the diagnoses, plan, and follow up with the patient and they expressed understanding.  Patient expressed understanding of the importance of proper follow up care.   Clent Demark Eluterio Seymour M.D. Diseases & Surgery of the Retina and Vitreous [facility]ina [facility] Diabetic [place] 06/06/20     Abbreviations: M myopia (nearsighted); A astigmatism; H hyperopia (farsighted); P presbyopia; Mrx spectacle prescription;  CTL contact lenses; OD right eye; OS left eye; OU both eyes  XT exotropia; ET esotropia; PEK punctate epithelial keratitis; PEE punctate epithelial erosions; DES dry eye syndrome; MGD meibomian gland dysfunction; ATs artificial tears; PFAT's preservative free artificial tears; [place] nuclear sclerotic cataract; PSC posterior subcapsular cataract; ERM epi-retinal membrane; PVD posterior vitreous detachment; RD retinal detachment; DM diabetes mellitus; DR diabetic retinopathy; NPDR non-proliferative diabetic retinopathy; PDR proliferative diabetic retinopathy; CSME clinically significant macular edema; DME diabetic macular edema; dbh dot blot hemorrhages; CWS cotton wool spot; POAG primary open angle glaucoma; C/D cup-to-disc ratio; HVF humphrey visual field; GVF goldmann visual field; OCT optical coherence tomography; IOP intraocular pressure; BRVO Branch retinal vein occlusion; CRVO central retinal vein occlusion; CRAO central  retinal artery occlusion; BRAO branch retinal artery occlusion; RT retinal tear; SB scleral buckle; PPV pars plana vitrectomy; VH Vitreous hemorrhage; PRP panretinal laser photocoagulation; IVK intravitreal kenalog; VMT vitreomacular traction; MH Macular hole;  NVD neovascularization of the disc; NVE neovascularization elsewhere; AREDS age related eye disease study; ARMD age related macular degeneration; POAG primary open angle glaucoma; EBMD epithelial/anterior basement membrane dystrophy; ACIOL anterior chamber intraocular lens; IOL intraocular lens; PCIOL posterior chamber intraocular lens; Phaco/IOL phacoemulsification with intraocular lens placement; [place] photorefractive keratectomy; LASIK laser assisted in situ keratomileusis; HTN hypertension; DM diabetes mellitus; COPD chronic obstructive pulmonary disease

## 2020-06-06 NOTE — Assessment & Plan Note (Signed)
Vision OS has resolved at present, there is no active disease, the patient reports slowly improving sense of color in the left eye

## 2020-07-21 ENCOUNTER — Other Ambulatory Visit: Payer: Self-pay | Admitting: Gastroenterology

## 2020-07-21 DIAGNOSIS — R109 Unspecified abdominal pain: Secondary | ICD-10-CM

## 2020-07-21 DIAGNOSIS — K2100 Gastro-esophageal reflux disease with esophagitis, without bleeding: Secondary | ICD-10-CM

## 2020-08-04 ENCOUNTER — Ambulatory Visit
Admission: RE | Admit: 2020-08-04 | Discharge: 2020-08-04 | Disposition: A | Discharge status: Home / Self Care | Payer: Managed Care, Other (non HMO) | Source: Ambulatory Visit | Attending: Gastroenterology | Admitting: Gastroenterology

## 2020-08-04 DIAGNOSIS — K2100 Gastro-esophageal reflux disease with esophagitis, without bleeding: Secondary | ICD-10-CM

## 2020-08-04 DIAGNOSIS — R109 Unspecified abdominal pain: Secondary | ICD-10-CM

## 2020-09-20 ENCOUNTER — Other Ambulatory Visit: Payer: Self-pay | Admitting: Urology

## 2020-10-05 ENCOUNTER — Other Ambulatory Visit (HOSPITAL_COMMUNITY)
Admission: RE | Admit: 2020-10-05 | Discharge: 2020-10-05 | Disposition: A | Discharge status: Home / Self Care | Payer: Managed Care, Other (non HMO) | Source: Ambulatory Visit | Attending: Urology | Admitting: Urology

## 2020-10-05 DIAGNOSIS — Z20822 Contact with and (suspected) exposure to covid-19: Secondary | ICD-10-CM | POA: Insufficient documentation

## 2020-10-05 DIAGNOSIS — Z01812 Encounter for preprocedural laboratory examination: Secondary | ICD-10-CM | POA: Diagnosis present

## 2020-10-05 LAB — SARS CORONAVIRUS 2 (TAT 6-24 HRS): SARS Coronavirus 2: NEGATIVE

## 2020-10-06 ENCOUNTER — Encounter (HOSPITAL_BASED_OUTPATIENT_CLINIC_OR_DEPARTMENT_OTHER): Payer: Self-pay | Admitting: Urology

## 2020-10-06 NOTE — Progress Notes (Signed)
Talked with patient . Instructions given arrival time 0600 driver secured and NPO after midnight

## 2020-10-09 ENCOUNTER — Encounter (HOSPITAL_BASED_OUTPATIENT_CLINIC_OR_DEPARTMENT_OTHER): Payer: Self-pay | Admitting: Urology

## 2020-10-09 ENCOUNTER — Ambulatory Visit (HOSPITAL_BASED_OUTPATIENT_CLINIC_OR_DEPARTMENT_OTHER)
Admission: RE | Admit: 2020-10-09 | Discharge: 2020-10-09 | Disposition: A | Discharge status: Home / Self Care | Payer: Managed Care, Other (non HMO) | Attending: Urology | Admitting: Urology

## 2020-10-09 ENCOUNTER — Ambulatory Visit (HOSPITAL_COMMUNITY): Payer: Managed Care, Other (non HMO)

## 2020-10-09 ENCOUNTER — Encounter (HOSPITAL_BASED_OUTPATIENT_CLINIC_OR_DEPARTMENT_OTHER)
Admission: RE | Disposition: A | Discharge status: Home / Self Care | Payer: Self-pay | Source: Home / Self Care | Attending: Urology

## 2020-10-09 DIAGNOSIS — Z885 Allergy status to narcotic agent status: Secondary | ICD-10-CM | POA: Insufficient documentation

## 2020-10-09 DIAGNOSIS — Z882 Allergy status to sulfonamides status: Secondary | ICD-10-CM | POA: Diagnosis not present

## 2020-10-09 DIAGNOSIS — N200 Calculus of kidney: Secondary | ICD-10-CM | POA: Insufficient documentation

## 2020-10-09 DIAGNOSIS — Z79899 Other long term (current) drug therapy: Secondary | ICD-10-CM | POA: Insufficient documentation

## 2020-10-09 SURGERY — LITHOTRIPSY, ESWL
Anesthesia: LOCAL | Laterality: Right

## 2020-10-09 MED ORDER — OXYCODONE-ACETAMINOPHEN 5-325 MG PO TABS: 1.0000 | ORAL_TABLET | ORAL | 0 refills | Status: DC | PRN

## 2020-10-09 MED ORDER — DIPHENHYDRAMINE HCL 25 MG PO CAPS
ORAL_CAPSULE | ORAL | Status: AC
  Filled 2020-10-09: qty 1

## 2020-10-09 MED ORDER — DIAZEPAM 5 MG PO TABS
10.0000 mg | ORAL_TABLET | ORAL | Status: AC
  Administered 2020-10-09: 10 mg via ORAL

## 2020-10-09 MED ORDER — SODIUM CHLORIDE 0.9 % IV SOLN: INTRAVENOUS | Status: DC

## 2020-10-09 MED ORDER — DIPHENHYDRAMINE HCL 25 MG PO CAPS
25.0000 mg | ORAL_CAPSULE | ORAL | Status: AC
  Administered 2020-10-09: 25 mg via ORAL

## 2020-10-09 MED ORDER — DOCUSATE SODIUM 100 MG PO CAPS: 100.0000 mg | ORAL_CAPSULE | Freq: Every day | ORAL | 0 refills | Status: DC | PRN

## 2020-10-09 MED ORDER — TAMSULOSIN HCL 0.4 MG PO CAPS
0.4000 mg | ORAL_CAPSULE | Freq: Every day | ORAL | 1 refills | Status: DC
Start: 2020-10-09 — End: 2021-06-06

## 2020-10-09 MED ORDER — CIPROFLOXACIN HCL 500 MG PO TABS
ORAL_TABLET | ORAL | Status: AC
  Filled 2020-10-09: qty 1

## 2020-10-09 MED ORDER — CIPROFLOXACIN HCL 500 MG PO TABS
500.0000 mg | ORAL_TABLET | ORAL | Status: AC
  Administered 2020-10-09: 500 mg via ORAL

## 2020-10-09 MED ORDER — DIAZEPAM 5 MG PO TABS
ORAL_TABLET | ORAL | Status: AC
  Filled 2020-10-09: qty 2

## 2020-10-09 NOTE — Op Note (Signed)
[facility] Operative Note  Treating Physician: Dream Nodal, MD  Pre-op diagnosis: Right renal stone  Post-op diagnosis: Same   Procedure: Right ESWL  See [facility] OP note scanned into chart. Also because of the size, density, location and other factors that cannot be anticipated I feel this will likely be a staged procedure. This fact supersedes any indication in the scanned [facility] operative note to the contrary.  Matt R. Jeyden Coffelt MD [facility]  Pager: [phone]   

## 2020-10-09 NOTE — H&P (Signed)
Office Visit Report     09/07/2020   --------------------------------------------------------------------------------   Shelly Gilbert  MRN: [id number]  DOB: [birth year], 59 year old Female  SSN: [id number]   PRIMARY CARE:  Minette Brine, MD  REFERRING:  Jeryl Columbia, MD  PROVIDER:  Festus Aloe, M.D.  LOCATION:  [facility] [phone]     --------------------------------------------------------------------------------   CC/HPI: F/u -   08/18/2020 - Gross hematuria, Flank Pain - 40/23 - 59 year old female with years of intermittent right upper quadrant pain. She undergone multiple imaging studies for this and has not had a clear answer. She has been treated for irritable bowel syndrome, GERD and worked up for gallstones. She reports significant increase in right sided pain over the past few weeks. She underwent a abdominal US which showed concern for a 31mm right sided calcification in the kidney. She presents today for consult. She deines fevers, chills and changes to her voiding habits. She endorses right flank, abdominal pain and nausea. Nothing really makes the pain better. She has noted some blood in her urine as well.   09/07/2020 - pt returns in eval of above. Urine cx was negative. CT done 08/25/2020 which was benign. There was a 7 x 7 mm RLP stone (visible, 1400 HU, ssd 6.5 cm) otherwise benign. She does have a RUQ pain for "20 yrs". She gets nausea with these episodes. She has a h/o MH and cysto years ago.   We discussed nature r/b/a to cysto to complete the hematuria eval and she elected to proceed.     ALLERGIES: No Allergies Sulfa (Sulfonamide Antibiotics) - Skin Rash    MEDICATIONS: Omeprazole     GU PSH: Locm 300-399Mg /Ml Iodine,1Ml - 08/25/2020       PSH Notes: Tonsillectomy   NON-GU PSH: Back Surgery (Unspecified) Remove Tonsils - 2014     GU PMH: Microscopic hematuria - 08/25/2020, - 08/18/2020 Flank Pain - 08/18/2020 Pelvic/perineal pain -  2019 Rectocele - 2019 Stress Incontinence, Female stress incontinence - 2014      PMH Notes:  1898-10-21 00:00:00 - Note: Normal Routine History And Physical Adult  2013-05-25 08:27:38 - Note: Arthritis   NON-GU PMH: Muscle weakness (generalized) - 2019 Other specified disorders of muscle - 2019 Personal history of other endocrine, nutritional and metabolic disease, History of hypercholesterolemia - 2014 Arthritis GERD    FAMILY HISTORY: Death In The Family Father - Runs In Family Death In The Family Mother - Runs In Family Diabetes - Runs In Family Family Health Status Number - Runs In Family Hypertension - Runs In Family nephrolithiasis - Mother Prostate Cancer - Father Stroke Syndrome - Mother   SOCIAL HISTORY: None    Notes: Never A Smoker, Marital History - Currently Married, Occupation:, Alcohol Use, Caffeine Use   REVIEW OF SYSTEMS:    GU Review Female:   Patient denies frequent urination, hard to postpone urination, burning /pain with urination, get up at night to urinate, leakage of urine, stream starts and stops, trouble starting your stream, have to strain to urinate, and being pregnant.  Gastrointestinal (Upper):   Patient denies nausea, vomiting, and indigestion/ heartburn.  Gastrointestinal (Lower):   Patient denies diarrhea and constipation.  Constitutional:   Patient denies fever, night sweats, weight loss, and fatigue.  Skin:   Patient denies skin rash/ lesion and itching.  Eyes:   Patient denies blurred vision and double vision.  Ears/ Nose/ Throat:   Patient denies sore throat and sinus problems.  Hematologic/Lymphatic:   Patient denies swollen glands and easy bruising.  Cardiovascular:   Patient denies leg swelling and chest pains.  Respiratory:   Patient denies cough and shortness of breath.  Endocrine:   Patient denies excessive thirst.  Musculoskeletal:   Patient denies joint pain and back pain.  Neurological:   Patient denies headaches and dizziness.   Psychologic:   Patient denies depression and anxiety.   VITAL SIGNS:      09/07/2020 03:07 PM  Weight 128 lb / 58.06 kg  Height 62 in / 157.48 cm  BP 151/77 mmHg  Heart Rate 83 /min  Temperature 98.2 F / 36.7 C  BMI 23.4 kg/m2   GU PHYSICAL EXAMINATION:    External Genitalia: No hirsutism, no rash, no scarring, no cyst, no erythematous lesion, no papular lesion, no blanched lesion, no warty lesion. No edema.  Urethral Meatus: Normal size. Normal position. No discharge.  Urethra: No tenderness, no mass, no scarring. No hypermobility. No leakage.  Bladder: Normal to palpation, no tenderness, no mass, normal size.   MULTI-SYSTEM PHYSICAL EXAMINATION:    Constitutional: Well-nourished. No physical deformities. Normally developed. Good grooming.  Neck: Neck symmetrical, not swollen. Normal tracheal position.  Respiratory: No labored breathing, no use of accessory muscles.   Cardiovascular: Normal temperature, normal extremity pulses, no swelling, no varicosities.  Neurologic / Psychiatric: Oriented to time, oriented to place, oriented to person. No depression, no anxiety, no agitation.  Gastrointestinal: No mass, no tenderness, no rigidity, non obese abdomen.     Complexity of Data:  X-Ray Review: C.T. Abdomen/Pelvis: Reviewed Films. 08/2020    PROCEDURES:          Urinalysis w/Scope Dipstick Dipstick Cont'd Micro  Color: Yellow Bilirubin: Neg mg/dL WBC/hpf: NS (Not Seen)  Appearance: Clear Ketones: Neg mg/dL RBC/hpf: 3 - 10/hpf  Specific Gravity: 1.025 Blood: 2+ ery/uL Bacteria: NS (Not Seen)  pH: 5.5 Protein: Neg mg/dL Cystals: NS (Not Seen)  Glucose: Neg mg/dL Urobilinogen: 0.2 mg/dL Casts: NS (Not Seen)    Nitrites: Neg Trichomonas: Not Present    Leukocyte Esterase: Neg leu/uL Mucous: Not Present      Epithelial Cells: NS (Not Seen)      Yeast: NS (Not Seen)      Sperm: Not Present    ASSESSMENT:      ICD-10 Details  1 GU:   Microscopic hematuria - R31.21 Chronic,  Stable  2   Renal calculus - N20.0 Chronic, Stable - Right renal stone - discussed her pain may not be coming from the stone so I cant say pain will improve with treatment. We went over the nature r/b of cont surv, ESWL and URS/HLL. She will proceed with RIGHT ESWL and discussed one of my colleagues would likely be doing the procedure.    PLAN:           Schedule Return Visit/Planned Activity: Next Available Appointment - Schedule Surgery          Document Letter(s):  Created for Patient: Clinical Summary         Notes:   cc: Dr. Kenton Kingfisher         Next Appointment:      Next Appointment: 09/07/2020 03:00 PM    Appointment Type: Office Visit Established Patient    Location: [facility] [phone]    Provider: Festus Aloe, M.D.    Reason for Visit: [place]    Urology Preoperative H&P   Chief Complaint: Right flank pain  History of Present Illness: Shelly Gilbert is a 59 y.o. female with right renal stone here for right ESWL. No fevers, chills, dysuria.    Past Medical History:  Diagnosis Date  . Adenomyosis   . Allergy    YEAR AROUND ALLERGIES  . Anemia   . Anxiety    situational per pt.  . Bursitis of hip    left  . Floaters, bilateral   . GERD (gastroesophageal reflux disease)   . Hemorrhoids   . Hiatal hernia   . Hyperlipidemia    borderline, controlled with diet per pt.  . IBS (irritable bowel syndrome)   . Seasonal allergies   . SVD (spontaneous vaginal delivery)    x 2  . Tubular adenoma of colon 04/2011    Past Surgical History:  Procedure Laterality Date  . COLONOSCOPY     polyps  . CYSTOSCOPY N/A 01/12/2018   Procedure: CYSTOSCOPY;  Surgeon: Megan Salon, MD;  Location: [facility];  Service: Gynecology;  Laterality: N/A;  . MAXIMUM ACCESS (MAS)POSTERIOR LUMBAR INTERBODY FUSION (PLIF) 1 LEVEL N/A 02/01/2014   Procedure: LUMBAR FIVE-SACFAL ONE POSTERIOR LUMBAR INTERBODY FUSION,GIL PROCEDURE;  Surgeon: Erline Levine, MD;  Location: [facility];  Service: Neurosurgery;  Laterality: N/A;  . MENISCUS REPAIR Bilateral    R 07/2018, L 09/2018  . TONSILLECTOMY    . TOTAL LAPAROSCOPIC HYSTERECTOMY WITH SALPINGECTOMY Bilateral 01/12/2018   Procedure: TOTAL LAPAROSCOPIC HYSTERECTOMY WITH SALPINGECTOMY Possible BSO;  Surgeon: Megan Salon, MD;  Location: [facility];  Service: Gynecology;  Laterality: Bilateral;  possible BSO  . UPPER GI ENDOSCOPY     reflux  . WISDOM TOOTH EXTRACTION      Allergies:  Allergies  Allergen Reactions  . Codeine Other (See Comments)    Severe nightmares    Family History  Problem Relation Age of Onset  . Colon cancer Paternal Grandfather   . Diabetes Father   . Colon polyps Brother   . Breast cancer Cousin 44  . Diabetes Mother   . Hypertension Mother   . Hypertension Sister     Social History:  reports that she has never smoked. She has never used smokeless tobacco. She reports current alcohol use. She reports that she does not use drugs.  ROS: A complete review of systems was performed.  All systems are negative except for pertinent findings as noted.  Physical Exam:  Vital signs in last 24 hours: Temp:  [96.8 F (36 C)-97.6 F (36.4 C)] 97.6 F (36.4 C) (12/20 0845) Pulse Rate:  [89] 89 (12/20 0620) Resp:  [12-18] 12 (12/20 0845) BP: (151-164)/(89-98) 151/98 (12/20 0845) SpO2:  [96 %-100 %] 96 % (12/20 0845) Weight:  [58.8 kg] 58.8 kg (12/20 0620) Constitutional:  Alert and oriented, No acute distress Cardiovascular: Regular rate and rhythm Respiratory: Normal respiratory effort, Lungs clear bilaterally GI: Abdomen is soft, nontender, nondistended, no abdominal masses GU: No CVA tenderness Lymphatic: No lymphadenopathy Neurologic: Grossly intact, no focal deficits Psychiatric: Normal mood and affect  Laboratory Data:  No results for input(s): WBC, HGB, HCT, PLT in the last 72 hours.  No results for input(s): NA, K, CL, GLUCOSE, BUN, CALCIUM, CREATININE in the last 72  hours.  Invalid input(s): CO3   No results found for this or any previous visit (from the past 24 hour(s)). Recent Results (from the past 240 hour(s))  SARS CORONAVIRUS 2 (TAT 6-24 HRS) Nasopharyngeal Nasopharyngeal Swab     Status: None   Collection Time: 10/05/20  8:33  AM   Specimen: Nasopharyngeal Swab  Result Value Ref Range Status   SARS Coronavirus 2 NEGATIVE NEGATIVE Final    Comment: (NOTE) SARS-CoV-2 target nucleic acids are NOT DETECTED.  The SARS-CoV-2 RNA is generally detectable in upper and lower respiratory specimens during the acute phase of infection. Negative results do not preclude SARS-CoV-2 infection, do not rule out co-infections with other pathogens, and should not be used as the sole basis for treatment or other patient management decisions. Negative results must be combined with clinical observations, patient history, and epidemiological information. The expected result is Negative.  Fact Sheet for Patients: SugarRoll.be  Fact Sheet for Healthcare Providers: https://www.woods-mathews.com/  This test is not yet approved or cleared by the [place] FDA and  has been authorized for detection and/or diagnosis of SARS-CoV-2 by FDA under an Emergency Use Authorization (EUA). This EUA will remain  in effect (meaning this test can be used) for the duration of the COVID-19 declaration under Se ction 564(b)(1) of the Act, 21 U.S.C. section 360bbb-3(b)(1), unless the authorization is terminated or revoked sooner.  Performed at [facility], [place] [address] [place], [place] [address]     Renal Function: No results for input(s): CREATININE in the last 168 hours. CrCl cannot be calculated (Patient's most recent lab result is older than the maximum 21 days allowed.).  Radiologic Imaging: DG Abd 1 View  Result Date: 10/09/2020 CLINICAL DATA:  Right nephrolithiasis. EXAM: ABDOMEN - 1 VIEW COMPARISON:  None.  FINDINGS: A 6 mm calculus is seen overlying the lower pole of the right kidney. No other definite radiopaque urinary calculi are seen. Multiple pelvic phleboliths are noted. The bowel gas pattern is normal. Lumbar spine fusion hardware noted. IMPRESSION: 6 mm right renal calculus. Electronically Signed   By: Marlaine Hind M.D.   On: 10/09/2020 08:03    I independently reviewed the above imaging studies.  Assessment and Plan CHANI GHANEM is a 59 y.o. female with right renal stone here for right ESWL.    Matt R. Rosha Cocker MD 10/09/2020, [facility] Pager: [phone]): [phone]

## 2020-10-09 NOTE — Discharge Instructions (Signed)

## 2020-10-10 ENCOUNTER — Encounter (HOSPITAL_BASED_OUTPATIENT_CLINIC_OR_DEPARTMENT_OTHER): Payer: Self-pay | Admitting: Urology

## 2021-02-08 ENCOUNTER — Other Ambulatory Visit: Payer: Self-pay | Admitting: Family Medicine

## 2021-02-08 DIAGNOSIS — I779 Disorder of arteries and arterioles, unspecified: Secondary | ICD-10-CM

## 2021-02-08 DIAGNOSIS — E042 Nontoxic multinodular goiter: Secondary | ICD-10-CM

## 2021-02-22 ENCOUNTER — Ambulatory Visit
Admission: RE | Admit: 2021-02-22 | Discharge: 2021-02-22 | Disposition: A | Discharge status: Home / Self Care | Payer: Managed Care, Other (non HMO) | Source: Ambulatory Visit | Attending: Family Medicine | Admitting: Family Medicine

## 2021-02-22 DIAGNOSIS — E042 Nontoxic multinodular goiter: Secondary | ICD-10-CM

## 2021-02-22 DIAGNOSIS — I779 Disorder of arteries and arterioles, unspecified: Secondary | ICD-10-CM

## 2021-03-06 ENCOUNTER — Encounter (INDEPENDENT_AMBULATORY_CARE_PROVIDER_SITE_OTHER): Payer: Managed Care, Other (non HMO) | Admitting: Ophthalmology

## 2021-03-09 ENCOUNTER — Encounter (INDEPENDENT_AMBULATORY_CARE_PROVIDER_SITE_OTHER): Payer: Self-pay

## 2021-03-31 ENCOUNTER — Other Ambulatory Visit: Payer: Self-pay | Admitting: Nurse Practitioner

## 2021-03-31 ENCOUNTER — Telehealth: Payer: Self-pay | Admitting: Nurse Practitioner

## 2021-03-31 MED ORDER — HYDROCORTISONE (PERIANAL) 2.5 % EX CREA: TOPICAL_CREAM | Freq: Every day | CUTANEOUS | Status: AC

## 2021-03-31 NOTE — Telephone Encounter (Signed)
Patient called answering service. Having hemorrhoid flare. She says her internal hemorrhoids are protruding out and causing discomfort. Prep H , witch hazel not helping. Requesting prescription strength cream.    She isn't straining. Hemorrhoids tend to flare when stools are loose. She recently had spicy homemade soup which led to loose stool  and aggravated hemorrhoids.   Will send Rx for Anusol cream nightly for 10 nights

## 2021-04-01 ENCOUNTER — Telehealth: Payer: Self-pay | Admitting: Physician Assistant

## 2021-04-18 DIAGNOSIS — M19049 Primary osteoarthritis, unspecified hand: Secondary | ICD-10-CM | POA: Insufficient documentation

## 2021-05-09 ENCOUNTER — Encounter: Payer: Self-pay | Admitting: Gastroenterology

## 2021-05-09 ENCOUNTER — Other Ambulatory Visit: Payer: Self-pay | Admitting: Gastroenterology

## 2021-05-10 ENCOUNTER — Other Ambulatory Visit: Payer: Self-pay | Admitting: Family Medicine

## 2021-05-10 DIAGNOSIS — Z1231 Encounter for screening mammogram for malignant neoplasm of breast: Secondary | ICD-10-CM

## 2021-05-22 ENCOUNTER — Telehealth: Payer: Self-pay | Admitting: Gastroenterology

## 2021-05-22 MED ORDER — OMEPRAZOLE 20 MG PO CPDR: 20.0000 mg | DELAYED_RELEASE_CAPSULE | Freq: Every day | ORAL | 0 refills | Status: DC

## 2021-05-22 NOTE — Telephone Encounter (Signed)
Patient hasn't been seen since 05/2019 in our office. Patient would like refill until her scheduled appt for colonoscopy. Prescription sent to patient's pharmacy.

## 2021-05-29 ENCOUNTER — Ambulatory Visit
Admission: RE | Admit: 2021-05-29 | Discharge: 2021-05-29 | Disposition: A | Discharge status: Home / Self Care | Payer: Managed Care, Other (non HMO) | Source: Ambulatory Visit | Attending: Family Medicine | Admitting: Family Medicine

## 2021-05-29 ENCOUNTER — Other Ambulatory Visit: Payer: Self-pay

## 2021-05-29 DIAGNOSIS — Z1231 Encounter for screening mammogram for malignant neoplasm of breast: Secondary | ICD-10-CM

## 2021-06-06 ENCOUNTER — Ambulatory Visit (AMBULATORY_SURGERY_CENTER): Payer: Managed Care, Other (non HMO)

## 2021-06-06 ENCOUNTER — Other Ambulatory Visit: Payer: Self-pay

## 2021-06-06 VITALS — Ht 62.5 in | Wt 130.0 lb

## 2021-06-06 DIAGNOSIS — Z86010 Personal history of colonic polyps: Secondary | ICD-10-CM

## 2021-06-06 MED ORDER — PEG-KCL-NACL-NASULF-NA ASC-C 100 G PO SOLR: 1.0000 | Freq: Once | ORAL | 0 refills | Status: AC

## 2021-06-06 NOTE — Progress Notes (Signed)
Pre visit completed via phone call; Patient verified name, DOB, and address; No egg or soy allergy known to patient  No issues with past sedation with any surgeries or procedures Patient denies ever being told they had issues or difficulty with intubation  No FH of Malignant Hyperthermia No diet pills per patient No home 02 use per patient  No blood thinners per patient  Pt denies issues with constipation at this time;  No A fib or A flutter  EMMI video via [facility] 19 guidelines implemented in PV today with Pt and RN  Pt is fully vaccinated for Covid   Coupon given to pt in PV today and NO PA's for preps discussed with pt in PV today  Discussed with pt there will be an out-of-pocket cost for prep and that varies from $0 to 70 dollars   Due to the COVID-19 pandemic we are asking patients to follow certain guidelines.  Pt aware of COVID protocols and [facility] guidelines

## 2021-06-07 ENCOUNTER — Encounter (INDEPENDENT_AMBULATORY_CARE_PROVIDER_SITE_OTHER): Payer: Managed Care, Other (non HMO) | Admitting: Ophthalmology

## 2021-06-13 ENCOUNTER — Other Ambulatory Visit: Payer: Self-pay | Admitting: Gastroenterology

## 2021-06-18 ENCOUNTER — Encounter (HOSPITAL_BASED_OUTPATIENT_CLINIC_OR_DEPARTMENT_OTHER): Payer: Self-pay

## 2021-06-19 ENCOUNTER — Telehealth: Payer: Self-pay | Admitting: Gastroenterology

## 2021-06-19 NOTE — Telephone Encounter (Signed)
Patient is asking if she can use OTC products during prep for her colonoscopy.  She is advised that she is able to use OTC products during and after the prep

## 2021-06-20 ENCOUNTER — Ambulatory Visit (AMBULATORY_SURGERY_CENTER): Payer: Managed Care, Other (non HMO) | Admitting: Gastroenterology

## 2021-06-20 ENCOUNTER — Telehealth: Payer: Self-pay | Admitting: Gastroenterology

## 2021-06-20 ENCOUNTER — Other Ambulatory Visit: Payer: Self-pay

## 2021-06-20 ENCOUNTER — Encounter: Payer: Self-pay | Admitting: Gastroenterology

## 2021-06-20 VITALS — BP 127/74 | HR 82 | Temp 98.6°F | Resp 14 | Ht 63.0 in | Wt 130.0 lb

## 2021-06-20 DIAGNOSIS — Z8371 Family history of colonic polyps: Secondary | ICD-10-CM

## 2021-06-20 DIAGNOSIS — D123 Benign neoplasm of transverse colon: Secondary | ICD-10-CM | POA: Diagnosis not present

## 2021-06-20 DIAGNOSIS — Z86010 Personal history of colonic polyps: Secondary | ICD-10-CM

## 2021-06-20 MED ORDER — OMEPRAZOLE 20 MG PO CPDR: DELAYED_RELEASE_CAPSULE | ORAL | 3 refills | Status: DC

## 2021-06-20 MED ORDER — SODIUM CHLORIDE 0.9 % IV SOLN: 500.0000 mL | INTRAVENOUS | Status: DC

## 2021-06-20 NOTE — Progress Notes (Signed)
Report to PACU, RN, vss, BBS= Clear.  

## 2021-06-20 NOTE — Patient Instructions (Signed)
Handout on polyps given. ° °YOU HAD AN ENDOSCOPIC PROCEDURE TODAY AT THE [facility]:   Refer to the procedure report that was given to you for any specific questions about what was found during the examination.  If the procedure report does not answer your questions, please call your gastroenterologist to clarify.  If you requested that your care partner not be given the details of your procedure findings, then the procedure report has been included in a sealed envelope for you to review at your convenience later. ° °YOU SHOULD EXPECT: Some feelings of bloating in the abdomen. Passage of more gas than usual.  Walking can help get rid of the air that was put into your GI tract during the procedure and reduce the bloating. If you had a lower endoscopy (such as a colonoscopy or flexible sigmoidoscopy) you may notice spotting of blood in your stool or on the toilet paper. If you underwent a bowel prep for your procedure, you may not have a normal bowel movement for a few days. ° °Please Note:  You might notice some irritation and congestion in your nose or some drainage.  This is from the oxygen used during your procedure.  There is no need for concern and it should clear up in a day or so. ° °SYMPTOMS TO REPORT IMMEDIATELY: ° °Following lower endoscopy (colonoscopy or flexible sigmoidoscopy): ° Excessive amounts of blood in the stool ° Significant tenderness or worsening of abdominal pains ° Swelling of the abdomen that is new, acute ° Fever of 100°F or higher ° °For urgent or emergent issues, a gastroenterologist can be reached at any hour by calling [phone]. °Do not use MyChart messaging for urgent concerns.  ° ° °DIET:  We do recommend a small meal at first, but then you may proceed to your regular diet.  Drink plenty of fluids but you should avoid alcoholic beverages for 24 hours. ° °ACTIVITY:  You should plan to take it easy for the rest of today and you should NOT DRIVE or use heavy machinery  until tomorrow (because of the sedation medicines used during the test).   ° °FOLLOW UP: °Our staff will call the number listed on your records 48-72 hours following your procedure to check on you and address any questions or concerns that you may have regarding the information given to you following your procedure. If we do not reach you, we will leave a message.  We will attempt to reach you two times.  During this call, we will ask if you have developed any symptoms of COVID 19. If you develop any symptoms (ie: fever, flu-like symptoms, shortness of breath, cough etc.) before then, please call [phone].  If you test positive for Covid 19 in the 2 weeks post procedure, please call and report this information to us.   ° °If any biopsies were taken you will be contacted by phone or by letter within the next 1-3 weeks.  Please call us at [phone] if you have not heard about the biopsies in 3 weeks.  ° ° °SIGNATURES/CONFIDENTIALITY: °You and/or your care partner have signed paperwork which will be entered into your electronic medical record.  These signatures attest to the fact that that the information above on your After Visit Summary has been reviewed and is understood.  Full responsibility of the confidentiality of this discharge information lies with you and/or your care-partner.  °

## 2021-06-20 NOTE — Op Note (Signed)
[place] Patient Name: Shelly Gilbert Procedure Date: 06/20/2021 8:24 AM MRN: [id number] Endoscopist: Ladene Artist , MD Age: 60 Referring MD:  Date of Birth: 16-Jan-1961 Gender: Female Account #: [id number] Procedure:                Colonoscopy Indications:              Colon cancer screening in patient at increased                            risk: Family history of colon polyps in multiple                            1st-degree relatives. Personal history of                            adenomatous colon polyps. Medicines:                Monitored Anesthesia Care Procedure:                Pre-Anesthesia Assessment:                           - Prior to the procedure, a History and Physical                            was performed, and patient medications and                            allergies were reviewed. The patient's tolerance of                            previous anesthesia was also reviewed. The risks                            and benefits of the procedure and the sedation                            options and risks were discussed with the patient.                            All questions were answered, and informed consent                            was obtained. Prior Anticoagulants: The patient has                            taken no previous anticoagulant or antiplatelet                            agents. ASA Grade Assessment: II - A patient with                            mild systemic disease. After reviewing the risks  and benefits, the patient was deemed in                            satisfactory condition to undergo the procedure.                           After obtaining informed consent, the colonoscope                            was passed under direct vision. Throughout the                            procedure, the patient's blood pressure, pulse, and                            oxygen saturations were monitored continuously.  The                            Olympus PCF-H190DL [id number]) Colonoscope was                            introduced through the anus and advanced to the the                            cecum, identified by appendiceal orifice and                            ileocecal valve. The ileocecal valve, appendiceal                            orifice, and rectum were photographed. The quality                            of the bowel preparation was good. The colonoscopy                            was performed without difficulty. The patient                            tolerated the procedure well. Scope In: 8:35:18 AM Scope Out: 8:51:05 AM Scope Withdrawal Time: 0 hours 10 minutes 44 seconds  Total Procedure Duration: 0 hours 15 minutes 47 seconds  Findings:                 The perianal and digital rectal examinations with                            skin tags otherwise normal.                           A 4 mm polyp was found in the transverse colon. The                            polyp was sessile. The polyp was removed with a  cold snare. Resection and retrieval were complete.                           External and internal hemorrhoids were found during                            retroflexion. The hemorrhoids were moderate.                           The exam was otherwise without abnormality on                            direct and retroflexion views. Complications:            No immediate complications. Estimated blood loss:                            None. Estimated Blood Loss:     Estimated blood loss: none. Impression:               - One 4 mm polyp in the transverse colon, removed                            with a cold snare. Resected and retrieved.                           - External and internal hemorrhoids.                           - The examination was otherwise normal on direct                            and retroflexion views. Recommendation:           - Repeat  colonoscopy after studies are complete for                            surveillance based on pathology results.                           - Patient has a contact number available for                            emergencies. The signs and symptoms of potential                            delayed complications were discussed with the                            patient. Return to normal activities tomorrow.                            Written discharge instructions were provided to the                            patient.                           -  Resume previous diet.                           - Continue present medications.                           - Await pathology results. Ladene Artist, MD 06/20/2021 8:54:43 AM This report has been signed electronically.

## 2021-06-20 NOTE — Telephone Encounter (Signed)
Prescription for brand name sent to [facility]. Patient notified.

## 2021-06-20 NOTE — Progress Notes (Signed)
Pt's states no medical or surgical changes since previsit or office visit. 

## 2021-06-20 NOTE — Progress Notes (Signed)
History & Physical  Primary Care Physician:  Shirline Frees, MD Primary Gastroenterologist: Jerilynn Mages. Fuller Plan, MD  CHIEF COMPLAINT: Family history of colon polyps, personal history of colon polyps   HPI: Shelly Gilbert is a 60 y.o. female who presents for surveillance colonoscopy for personal history of adenomatous colon polyps and family history of colon polyps in 2 first-degree relatives.  She relates problems with hemorrhoids and rectocele.   Past Medical History:  Diagnosis Date   Adenomyosis    Anemia    Anxiety    situational per pt.   Arthritis    RIGHT knee/ bilateral hands/RIGHT foot   Bursitis of hip    left   Chronic kidney disease    hx of kidney stones   Floaters, bilateral    GERD (gastroesophageal reflux disease)    on meds   Hemorrhoids    Hiatal hernia    Hyperlipidemia    borderline, controlled with diet per pt.   Hypertension    IBS (irritable bowel syndrome)    Seasonal allergies    Seasonal allergies    SVD (spontaneous vaginal delivery)    x 2   Thyroid disease    goiter and cyst on thyroid-followed by [place] yearly   Tubular adenoma of colon 04/2011   White coat syndrome without diagnosis of hypertension    while in office/sx-anxiety increases as well    Past Surgical History:  Procedure Laterality Date   COLONOSCOPY  2017   MS-MAC-suprep(exc)-hems-(hx of TA)-5 yr recall   CYSTOSCOPY N/A 01/12/2018   Procedure: CYSTOSCOPY;  Surgeon: Megan Salon, MD;  Location: [facility];  Service: Gynecology;  Laterality: N/A;   EXTRACORPOREAL SHOCK WAVE LITHOTRIPSY Right 10/09/2020   Procedure: EXTRACORPOREAL SHOCK WAVE LITHOTRIPSY (ESWL);  Surgeon: Janith Lima, MD;  Location: [facility];  Service: Urology;  Laterality: Right;   MAXIMUM ACCESS (MAS)POSTERIOR LUMBAR INTERBODY FUSION (PLIF) 1 LEVEL N/A 02/01/2014   Procedure: LUMBAR FIVE-SACFAL ONE POSTERIOR LUMBAR INTERBODY FUSION,GIL PROCEDURE;  Surgeon: Erline Levine, MD;  Location: [facility];   Service: Neurosurgery;  Laterality: N/A;   MENISCUS REPAIR Bilateral    R 07/2018, L 09/2018   POLYPECTOMY     TONSILLECTOMY     TOTAL LAPAROSCOPIC HYSTERECTOMY WITH SALPINGECTOMY Bilateral 01/12/2018   Procedure: TOTAL LAPAROSCOPIC HYSTERECTOMY WITH SALPINGECTOMY Possible BSO;  Surgeon: Megan Salon, MD;  Location: [facility];  Service: Gynecology;  Laterality: Bilateral;  possible BSO   UPPER GI ENDOSCOPY     reflux   WISDOM TOOTH EXTRACTION      Prior to Admission medications   Medication Sig Start Date End Date Taking? Authorizing Provider  COLLAGEN PO Take by mouth.   Yes [provider]  Fiber CHEW Chew 1 tablet by mouth daily as needed.   Yes [provider]  KRILL OIL PO Take 1 capsule by mouth daily.   Yes [provider]  Multiple Vitamin (MULTIVITAMIN) tablet Take 1 tablet by mouth daily.   Yes [provider]  Multiple Vitamins-Minerals (EYE VITAMINS PO) Take 1 capsule by mouth 2 (two) times daily.   Yes [provider]  omeprazole (PRILOSEC) 20 MG capsule TAKE 1 CAPSULE BY MOUTH EVERY DAY 06/13/21  Yes Ladene Artist, MD  Probiotic Product (DIGESTIVE ADVANTAGE PO) Take 1 tablet by mouth daily.   Yes [provider]  Turmeric 500 MG CAPS Take by mouth daily.   Yes [provider]  dorzolamide (TRUSOPT) 2 % ophthalmic solution Place 1 drop  into the left eye 3 (three) times daily. 05/17/21   [provider]  eplerenone (INSPRA) 25 MG tablet Take 0.5 tablets by mouth daily. Patient not taking: Reported on 06/06/2021 05/14/19   [provider]  OPZELURA 1.5 % CREA Apply 1 Dose topically daily. 05/30/21   [provider]  OVER THE COUNTER MEDICATION Take 1 Dose by mouth daily. OAT powder    [provider]  tretinoin (RETIN-A) 0.025 % cream tretinoin 0.025 % topical cream  APPLY CREAM TOPICALLY TO AFFECTED AREA AT BEDTIME    [provider]  VITAMIN D, ERGOCALCIFEROL, PO Take  1,000 Units by mouth daily.    [provider]    Current Outpatient Medications  Medication Sig Dispense Refill   COLLAGEN PO Take by mouth.     Fiber CHEW Chew 1 tablet by mouth daily as needed.     KRILL OIL PO Take 1 capsule by mouth daily.     Multiple Vitamin (MULTIVITAMIN) tablet Take 1 tablet by mouth daily.     Multiple Vitamins-Minerals (EYE VITAMINS PO) Take 1 capsule by mouth 2 (two) times daily.     omeprazole (PRILOSEC) 20 MG capsule TAKE 1 CAPSULE BY MOUTH EVERY DAY 30 capsule 0   Probiotic Product (DIGESTIVE ADVANTAGE PO) Take 1 tablet by mouth daily.     Turmeric 500 MG CAPS Take by mouth daily.     dorzolamide (TRUSOPT) 2 % ophthalmic solution Place 1 drop into the left eye 3 (three) times daily.     eplerenone (INSPRA) 25 MG tablet Take 0.5 tablets by mouth daily. (Patient not taking: Reported on 06/06/2021)     OPZELURA 1.5 % CREA Apply 1 Dose topically daily.     OVER THE COUNTER MEDICATION Take 1 Dose by mouth daily. OAT powder     tretinoin (RETIN-A) 0.025 % cream tretinoin 0.025 % topical cream  APPLY CREAM TOPICALLY TO AFFECTED AREA AT BEDTIME     VITAMIN D, ERGOCALCIFEROL, PO Take 1,000 Units by mouth daily.     Current Facility-Administered Medications  Medication Dose Route Frequency Provider Last Rate Last Admin   0.9 %  sodium chloride infusion  500 mL Intravenous Continuous Ladene Artist, MD        Allergies as of 06/20/2021 - Review Complete 06/20/2021  Allergen Reaction Noted   Codeine Other (See Comments)     Family History  Problem Relation Age of Onset   Diabetes Mother    Hypertension Mother    Colon polyps Father        section of colon removed-unknown if CA related   Diabetes Father    Hypertension Sister    Colon polyps Brother 47   Colon polyps Paternal Grandfather 64   Colon cancer Paternal Grandfather 22   Breast cancer Cousin 45   Esophageal cancer Neg Hx    Rectal cancer Neg Hx    Stomach cancer Neg Hx     Social  History   Socioeconomic History   Marital status: Married    Spouse name: Not on file   Number of children: 2    Years of education: Not on file   Highest education level: Not on file  Occupational History   Occupation: Glass blower/designer  Tobacco Use   Smoking status: Never   Smokeless tobacco: Never  Vaping Use   Vaping Use: Never used  Substance and Sexual Activity   Alcohol use: Yes    Comment: social wine   Drug use: No  Sexual activity: Yes    Birth control/protection: Other-see comments    Comment: husband vasectomy   Other Topics Concern   Not on file  Social History Narrative   One caffeine drink weekly    Social Determinants of Health   Financial Resource Strain: Not on file  Food Insecurity: Not on file  Transportation Needs: Not on file  Physical Activity: Not on file  Stress: Not on file  Social Connections: Not on file  Intimate Partner Violence: Not on file    Review of Systems:  All systems reviewed an negative except where noted in HPI.  Gen: Denies any fever, chills, sweats, anorexia, fatigue, weakness, malaise, weight loss, and sleep disorder CV: Denies chest pain, angina, palpitations, syncope, orthopnea, PND, peripheral edema, and claudication. Resp: Denies dyspnea at rest, dyspnea with exercise, cough, sputum, wheezing, coughing up blood, and pleurisy. GI: Denies vomiting blood, jaundice, and fecal incontinence.   Denies dysphagia or odynophagia. GU : Denies urinary burning, blood in urine, urinary frequency, urinary hesitancy, nocturnal urination, and urinary incontinence. MS: Denies joint pain, limitation of movement, and swelling, stiffness, low back pain, extremity pain. Denies muscle weakness, cramps, atrophy.  Derm: Denies rash, itching, dry skin, hives, moles, warts, or unhealing ulcers.  Psych: Denies depression, anxiety, memory loss, suicidal ideation, hallucinations, paranoia, and confusion. Heme: Denies bruising, bleeding, and enlarged  lymph nodes. Neuro:  Denies any headaches, dizziness, paresthesias. Endo:  Denies any problems with DM, thyroid, adrenal function.   Physical Exam: General:  Alert, well-developed, in NAD Head:  Normocephalic and atraumatic. Eyes:  Sclera clear, no icterus.   Conjunctiva pink. Ears:  Normal auditory acuity. Mouth:  No deformity or lesions.  Neck:  Supple; no masses . Lungs:  Clear throughout to auscultation.   No wheezes, crackles, or rhonchi. No acute distress. Heart:  Regular rate and rhythm; no murmurs. Abdomen:  Soft, nondistended, nontender. No masses, hepatomegaly. No obvious masses.  Normal bowel .    Rectal:  Deferred   Msk:  Symmetrical without gross deformities.. Pulses:  Normal pulses noted. Extremities:  Without edema. Neurologic:  Alert and  oriented x4;  grossly normal neurologically. Skin:  Intact without significant lesions or rashes. Cervical Nodes:  No significant cervical adenopathy. Psych:  Alert and cooperative. Normal mood and affect.    Impression / Plan:   Family history of colon polyps, 2 first-degree relatives and personal history of adenomatous colon polyps for surveillance colonoscopy.   This patient is appropriate for endoscopic procedures in the ambulatory setting.    Pricilla Riffle. Fuller Plan  06/20/2021, 8:31 AM

## 2021-06-20 NOTE — Progress Notes (Signed)
Called to room to assist during endoscopic procedure.  Patient ID and intended procedure confirmed with present staff. Received instructions for my participation in the procedure from the performing physician.  

## 2021-06-20 NOTE — Telephone Encounter (Signed)
Pt called requesting brand name for Prilosec because generic doe snot work for her. He needs a 90-day supply sent to [facility].

## 2021-06-22 ENCOUNTER — Telehealth: Payer: Self-pay | Admitting: *Deleted

## 2021-06-22 NOTE — Telephone Encounter (Signed)
  Follow up Call-  Call back number 06/20/2021  Post procedure Call Back phone  # [phone]  Permission to leave phone message Yes  Some recent data might be hidden     Patient questions:  Message left to call us if necessary.

## 2021-06-22 NOTE — Telephone Encounter (Signed)
  Follow up Call-  Call back number 06/20/2021  Post procedure Call Back phone  # [phone]  Permission to leave phone message Yes  Some recent data might be hidden     Patient questions:  Do you have a fever, pain , or abdominal swelling? No. Pain Score  0 *  Have you tolerated food without any problems? Yes.    Have you been able to return to your normal activities? Yes.    Do you have any questions about your discharge instructions: Diet   No. Medications  No. Follow up visit  No.  Do you have questions or concerns about your Care? No.  Actions: * If pain score is 4 or above: No action needed, pain <4. Have you developed a fever since your procedure? no  2.   Have you had an respiratory symptoms (SOB or cough) since your procedure? no  3.   Have you tested positive for COVID 19 since your procedure no  4.   Have you had any family members/close contacts diagnosed with the COVID 19 since your procedure?  no   If yes to any of these questions please route to Joylene John, RN and Joella Prince, RN  Pt states she has been having loose stools since procedure.  No pain, nausea, fever or blood noted. I explained it may take a few days to have a solid, formed stool.  She is instructed to call back in a couple of days if she is having any issues and understanding voiced.

## 2021-07-05 ENCOUNTER — Encounter: Payer: Self-pay | Admitting: Gastroenterology

## 2021-07-23 ENCOUNTER — Ambulatory Visit (HOSPITAL_BASED_OUTPATIENT_CLINIC_OR_DEPARTMENT_OTHER): Payer: Managed Care, Other (non HMO) | Admitting: Obstetrics & Gynecology

## 2021-09-04 ENCOUNTER — Encounter (INDEPENDENT_AMBULATORY_CARE_PROVIDER_SITE_OTHER): Payer: Managed Care, Other (non HMO) | Admitting: Ophthalmology

## 2021-09-12 DIAGNOSIS — H93292 Other abnormal auditory perceptions, left ear: Secondary | ICD-10-CM | POA: Insufficient documentation

## 2021-09-19 ENCOUNTER — Encounter (INDEPENDENT_AMBULATORY_CARE_PROVIDER_SITE_OTHER): Payer: Self-pay

## 2021-09-22 IMAGING — US US THYROID
1 series · 13 of 25 positions shown · non-contrast
Comparison: Prior thyroid ultrasound 01/28/2020

CLINICAL DATA: Goiter.

EXAM:
THYROID ULTRASOUND
TECHNIQUE: Ultrasound examination of the thyroid gland and adjacent soft
tissues was performed.

[Series 1: us thyroid · 0.06mm/px · 13 of 71 slices shown]
[im 1/71]
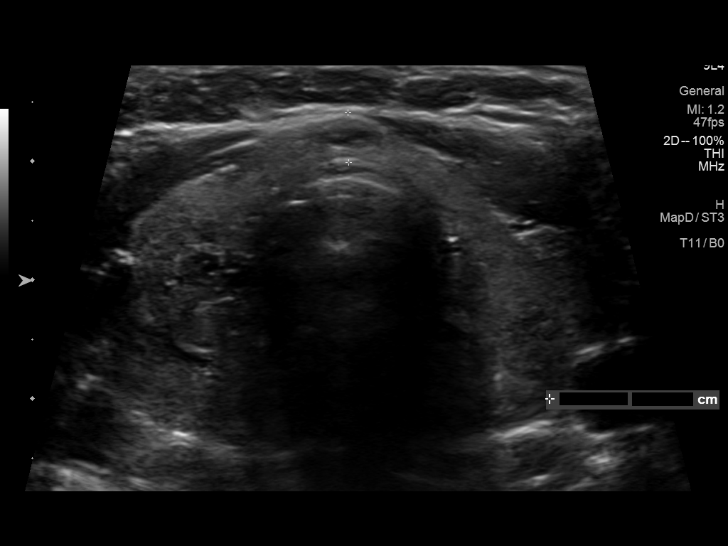
[im 6/71]
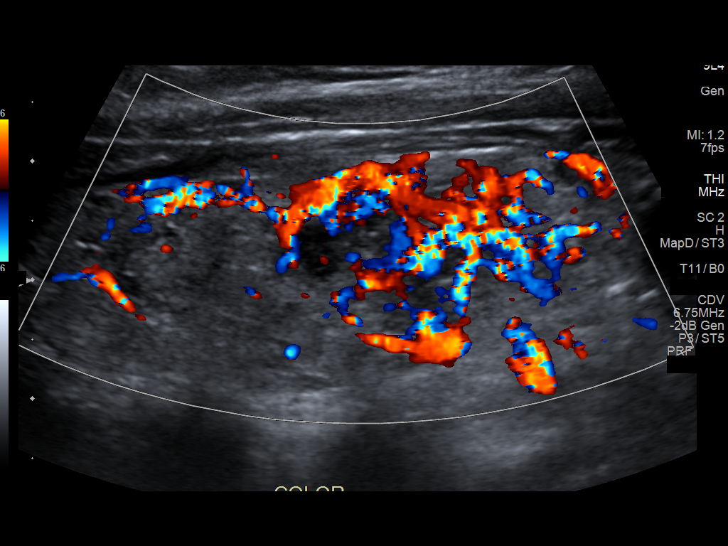
[im 12/71]
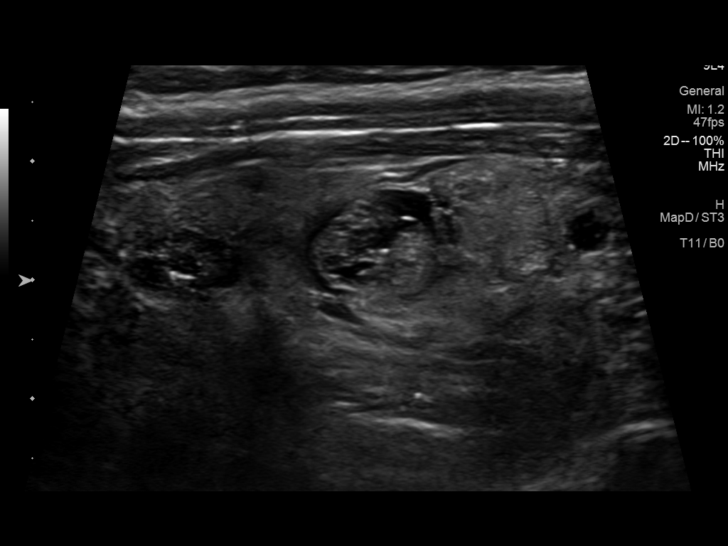
[im 18/71]
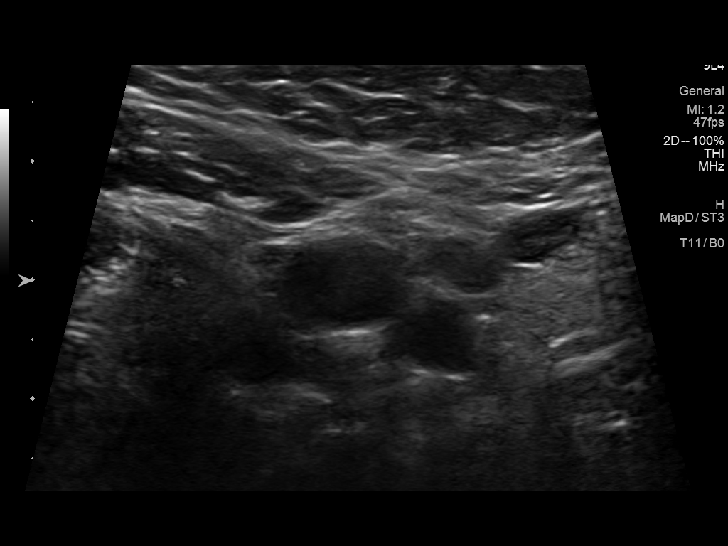
[im 24/71]
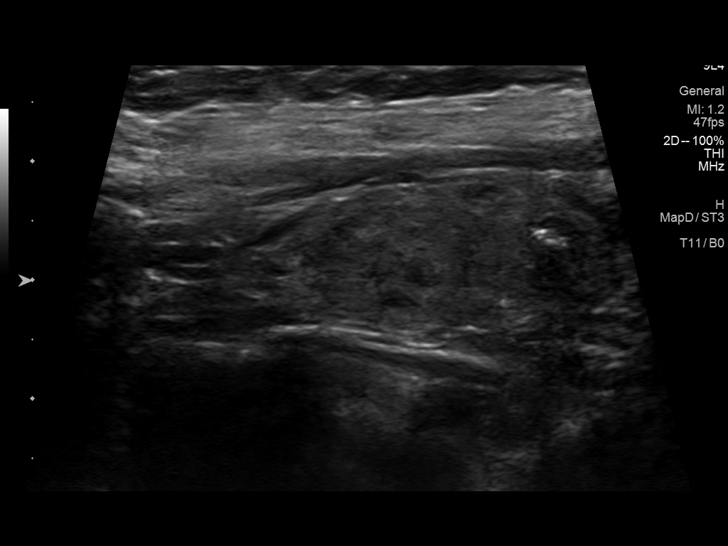
[im 30/71]
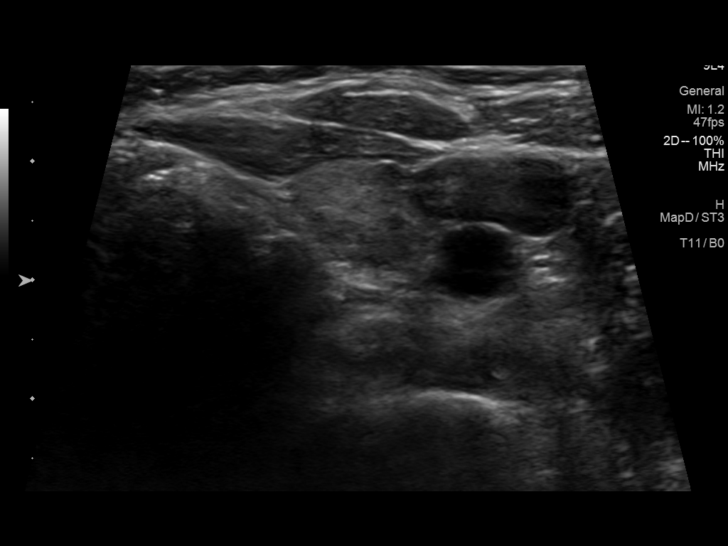
[im 36/71]
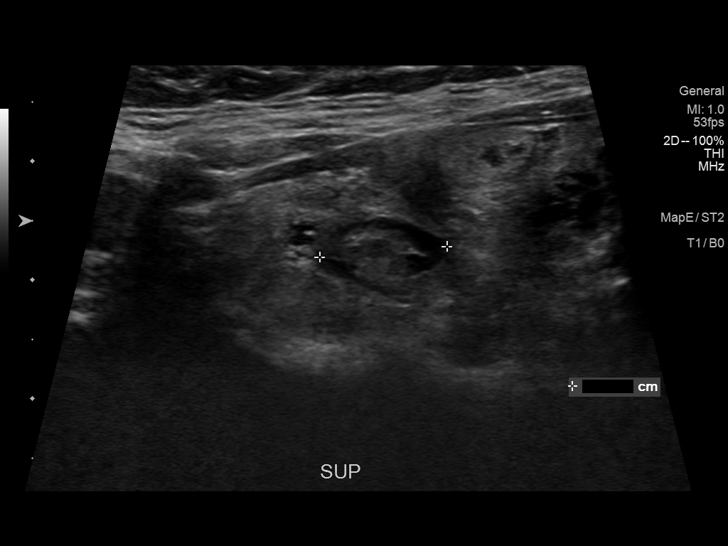
[im 41/71]
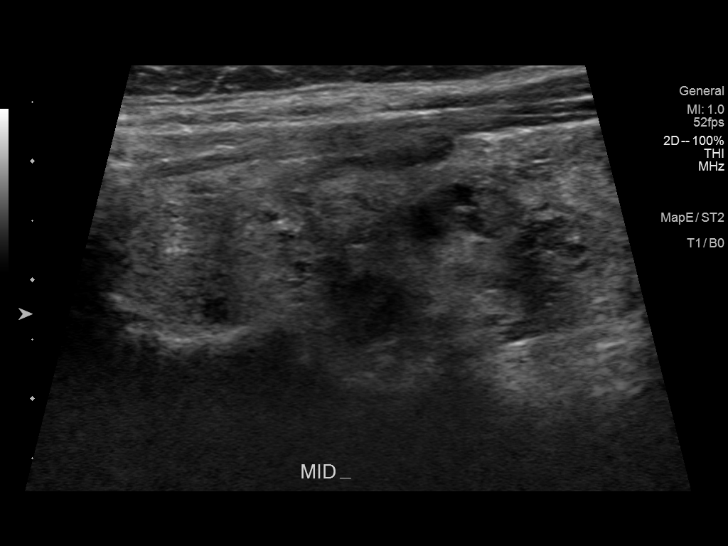
[im 47/71]
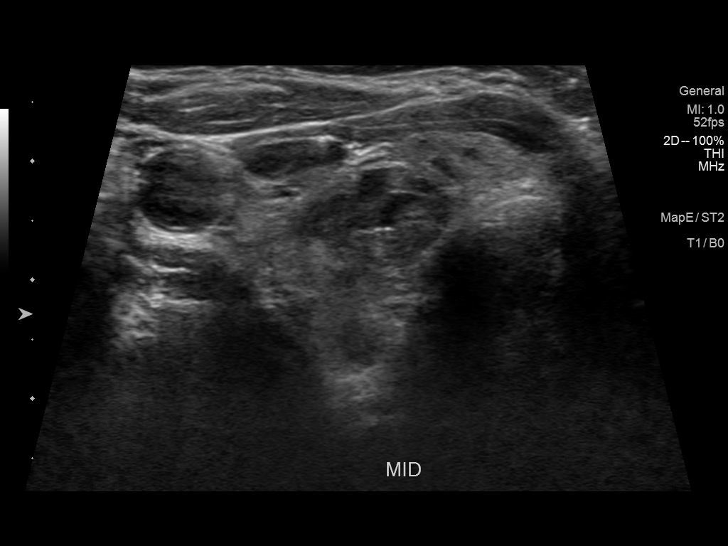
[im 53/71]
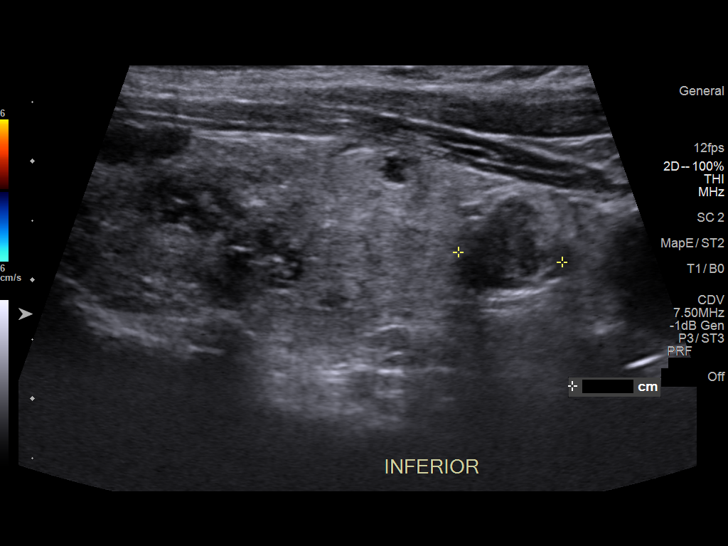
[im 59/71]
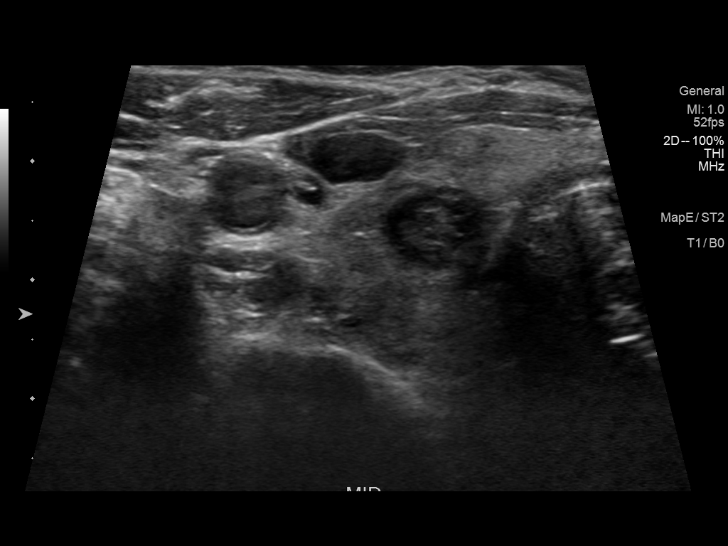
[im 65/71]
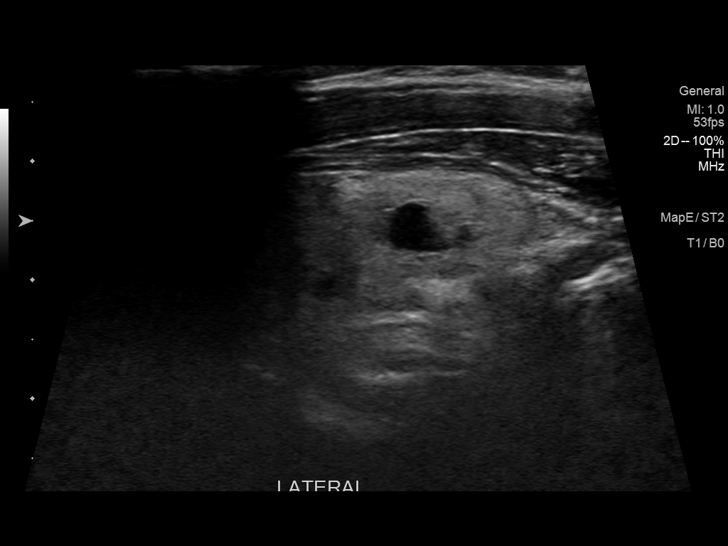
[im 71/71]
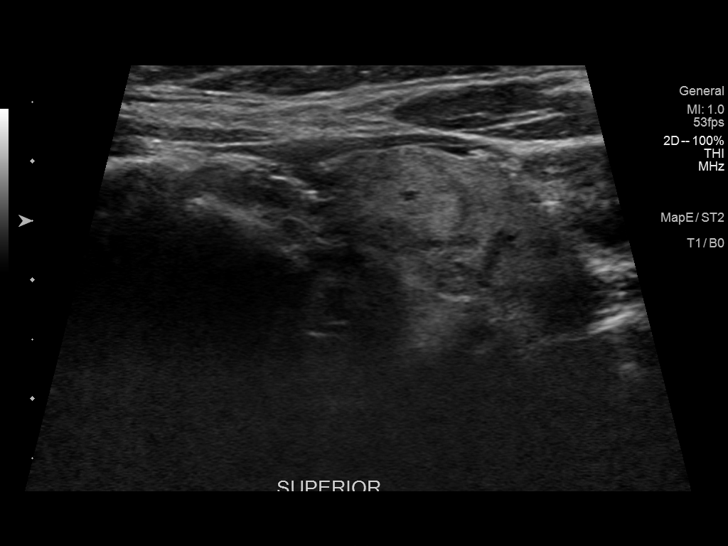

[13 of 25 positions shown; findings below may reference images not displayed]

FINDINGS: Parenchymal Echotexture: Moderately heterogenous

Isthmus: 0.4 cm

Right lobe: 5.8 x 2.2 x 1.9 cm

Left lobe: 4.9 x 1.4 x 1.7 cm

_________________________________________________________

Estimated total number of nodules >/= 1 cm: 3

Number of spongiform nodules >/=  2 cm not described below (TR1): 0

Number of mixed cystic and solid nodules >/= 1.5 cm not described
below (TR2): 0

_________________________________________________________

Nodule # 1: Approximately 1.1 cm mixed cystic and solid nodule in
the right upper gland. The solid component is isoechoic consistent
with TI-RADS category 2. No further follow-up required.

Nodule # 2: Small subcentimeter spongiform nodule in the right mid
gland. No further follow-up.

Nodule # 3: Mixed cystic and solid, isoechoic nodule in the right
mid gland has slightly involuted in size at 1.3 x 1.2 x 0.8 cm
compared to 1.6 x 1.3 x 0.9 cm previously. Involution over time is
consistent with benignity. No further follow-up required.

Nodule # 4: Subcentimeter hypoechoic nodule versus cyst at the lower
pole. No further follow-up.

Nodule # 5: Solid isoechoic nodule in the left superior gland
measures up to 1.1 cm, slightly decreased compared to 1.3 cm
previously. Involution is consistent with benignity. No further
follow-up required.
IMPRESSION: Heterogeneous, enlarged and multinodular thyroid goiter without
significant interval change compared to prior imaging. No nodules
meet criteria to consider biopsy or further imaging evaluation. No
further follow-up required.

The above is in keeping with the ACR TI-RADS recommendations - [HOSPITAL] 4604;[DATE].

## 2021-09-24 ENCOUNTER — Encounter (INDEPENDENT_AMBULATORY_CARE_PROVIDER_SITE_OTHER): Payer: Managed Care, Other (non HMO) | Admitting: Ophthalmology

## 2021-09-27 ENCOUNTER — Ambulatory Visit (HOSPITAL_BASED_OUTPATIENT_CLINIC_OR_DEPARTMENT_OTHER): Payer: Managed Care, Other (non HMO) | Admitting: Obstetrics & Gynecology

## 2021-10-01 ENCOUNTER — Ambulatory Visit (INDEPENDENT_AMBULATORY_CARE_PROVIDER_SITE_OTHER): Payer: Managed Care, Other (non HMO) | Admitting: Ophthalmology

## 2021-10-01 ENCOUNTER — Encounter (INDEPENDENT_AMBULATORY_CARE_PROVIDER_SITE_OTHER): Payer: Self-pay | Admitting: Ophthalmology

## 2021-10-01 ENCOUNTER — Other Ambulatory Visit: Payer: Self-pay

## 2021-10-01 DIAGNOSIS — H35722 Serous detachment of retinal pigment epithelium, left eye: Secondary | ICD-10-CM | POA: Diagnosis not present

## 2021-10-01 DIAGNOSIS — H35712 Central serous chorioretinopathy, left eye: Secondary | ICD-10-CM | POA: Diagnosis not present

## 2021-10-01 MED ORDER — DORZOLAMIDE HCL 2 % OP SOLN: 1.0000 [drp] | Freq: Three times a day (TID) | OPHTHALMIC | 6 refills | Status: AC

## 2021-10-01 NOTE — Assessment & Plan Note (Signed)
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  November 2020.  In the interim patient has undergone topical therapy using topical dorzolamide she is also tried episodic uses of eplerenone the notes from [facility] at [facility] reviewed.  She reports center involved OCT likely October 2021 centered subfield thickness of 324 m in today at 59, which is overall improved.  Therefore I recommend the patient not to use systemic eplerenone, continue on topical dorzolamide 2-3 times daily and moreover to continue to look for spontaneous improvement OS.  If however symptoms recur or do not improve, we could perform fluorescein angiography again and if treatable consider micro pulse subthreshold navigated laser with NAVILAS laser as has been performed in the past for noncentral involved central serous retinopathy superior to the fovea performed in 2020.  I have explained to the patient that this is nonscarring fine, and less likely to trigger late onset CNVM formation.  She expressed concern that persistent subretinal fluid can cause damage.  I informed the patient that that damage is already occurred and still will occur because of the quick sick RPE" from having the underlying condition the first place delivering a lifetime risk of wet macular degeneration developing in the left eye which cannot be avoided.

## 2021-10-01 NOTE — Assessment & Plan Note (Signed)
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  November 2020.  In the interim patient has undergone topical therapy using topical dorzolamide she is also tried episodic uses of eplerenone the notes from Ebony Cargo at [facility] reviewed.  She reports center involved OCT likely October 2021 centered subfield thickness of 324 m in today at 32, which is overall improved.  Therefore I recommend the patient not to use systemic eplerenone, continue on topical dorzolamide 2-3 times daily and moreover to continue to look for spontaneous improvement OS.  If however symptoms recur or do not improve, we could perform fluorescein angiography again and if treatable consider micro pulse subthreshold navigated laser with NAVILAS laser as has been performed in the past for noncentral involved central serous retinopathy superior to the fovea performed in 2020.  I have explained to the patient that this is nonscarring fine, and less likely to trigger late onset CNVM formation.  She expressed concern that persistent subretinal fluid can cause damage.  I informed the patient that that damage is already occurred and still will occur because of the quick sick RPE" from having the underlying condition the first place delivering a lifetime risk of wet macular degeneration developing in the left eye which cannot be avoided.

## 2021-10-01 NOTE — Progress Notes (Signed)
10/01/2021     CHIEF COMPLAINT Patient presents for  Chief Complaint  Patient presents with   Retina Follow Up      HISTORY OF PRESENT ILLNESS: Shelly Gilbert is a 60 y.o. 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 HHoy Morn(missed last fu appt). [facility], [facility] was supposed to go [facility] there but when I left that day I had to get back in line so I thought I would call to make an appointment on the phone but the wait was too long- so Dr WKathlen Mody since retired, told me to try Dr. RZadie Rhineagain. I am not agreeing with what [facility] was doing- I think they made it worse. The eye Dr. RZadie Rhinedid the laser on (my left eye) it helped but it is slowly coming back. [facility] put me on eye drops that I feel like it made it worse." Pt is using trusopt (dorzolamide) BID to TID OS. Pt states she feels like her eye sight is worse since taking those drops. Pt did not use the drops this morning. Pt states she is curious if these eye drops could cause headaches, states she has had headaches often since having COVID Sept 8th. Pt states she has a headache this morning and has had a headache all week.  Patient has been placed on topical dorzolamide 2-3 times.  Other options have included in the past discussion of adding Diamox systemically to her periodic use of eplerenone.  Eplerenone is not currently being used nor has been used for the last 2 to 3 months and during the last 2 to 3 months she has improved while using topical dorzolamide alone.  Symptomatically she reports that Recent overall a improvement symptomatically visual deficit is "breaking up into splotchiness of loss so this is over        Last edited by RHurman Horn MD on 10/01/2021  10:09 AM.      Referring physician: HShirline Frees MD [address]  [place] [id number] HISTORICAL INFORMATION:   Selected notes from the MEDICAL RECORD NUMBER       CURRENT MEDICATIONS: Current Outpatient Medications (Ophthalmic Drugs)  Medication Sig   dorzolamide (TRUSOPT) 2 % ophthalmic solution Place 1 drop into the left eye 3 (three) times daily.   No current facility-administered medications for this visit. (Ophthalmic Drugs)   Current Outpatient Medications (Other)  Medication Sig   COLLAGEN PO Take by mouth.   eplerenone (INSPRA) 25 MG tablet Take 0.5 tablets by mouth daily. (Patient not taking: Reported on 06/06/2021)   Fiber CHEW Chew 1 tablet by mouth daily as needed.   KRILL OIL PO Take 1 capsule by mouth daily.   Multiple Vitamin (MULTIVITAMIN) tablet Take 1 tablet by mouth daily.   Multiple Vitamins-Minerals (EYE VITAMINS PO) Take 1 capsule by mouth 2 (two) times daily.   omeprazole (PRILOSEC) 20 MG capsule TAKE 1 CAPSULE BY MOUTH EVERY DAY   OPZELURA 1.5 % CREA Apply 1 Dose topically daily.   OVER THE COUNTER MEDICATION Take 1  Dose by mouth daily. OAT powder   Probiotic Product (DIGESTIVE ADVANTAGE PO) Take 1 tablet by mouth daily.   tretinoin (RETIN-A) 0.025 % cream tretinoin 0.025 % topical cream  APPLY CREAM TOPICALLY TO AFFECTED AREA AT BEDTIME   Turmeric 500 MG CAPS Take by mouth daily.   VITAMIN D, ERGOCALCIFEROL, PO Take 1,000 Units by mouth daily.   No current facility-administered medications for this visit. (Other)      REVIEW OF SYSTEMS:    ALLERGIES Allergies  Allergen Reactions   Codeine Other (See Comments)    Severe nightmares    PAST MEDICAL HISTORY Past Medical History:  Diagnosis Date   Adenomyosis    Anemia    Anxiety    situational per pt.   Arthritis    RIGHT knee/ bilateral hands/RIGHT foot   Bursitis of hip    left   Chronic kidney disease    hx of kidney stones   Floaters, bilateral    GERD  (gastroesophageal reflux disease)    on meds   Hemorrhoids    Hiatal hernia    Hyperlipidemia    borderline, controlled with diet per pt.   Hypertension    IBS (irritable bowel syndrome)    Seasonal allergies    Seasonal allergies    SVD (spontaneous vaginal delivery)    x 2   Thyroid disease    goiter and cyst on thyroid-followed by [place] yearly   Tubular adenoma of colon 04/2011   White coat syndrome without diagnosis of hypertension    while in office/sx-anxiety increases as well   Past Surgical History:  Procedure Laterality Date   COLONOSCOPY  2017   MS-MAC-suprep(exc)-hems-(hx of TA)-5 yr recall   CYSTOSCOPY N/A 01/12/2018   Procedure: CYSTOSCOPY;  Surgeon: Megan Salon, MD;  Location: [facility];  Service: Gynecology;  Laterality: N/A;   EXTRACORPOREAL SHOCK WAVE LITHOTRIPSY Right 10/09/2020   Procedure: EXTRACORPOREAL SHOCK WAVE LITHOTRIPSY (ESWL);  Surgeon: Janith Lima, MD;  Location: [facility];  Service: Urology;  Laterality: Right;   MAXIMUM ACCESS (MAS)POSTERIOR LUMBAR INTERBODY FUSION (PLIF) 1 LEVEL N/A 02/01/2014   Procedure: LUMBAR FIVE-SACFAL ONE POSTERIOR LUMBAR INTERBODY FUSION,GIL PROCEDURE;  Surgeon: Erline Levine, MD;  Location: [facility];  Service: Neurosurgery;  Laterality: N/A;   MENISCUS REPAIR Bilateral    R 07/2018, L 09/2018   POLYPECTOMY     TONSILLECTOMY     TOTAL LAPAROSCOPIC HYSTERECTOMY WITH SALPINGECTOMY Bilateral 01/12/2018   Procedure: TOTAL LAPAROSCOPIC HYSTERECTOMY WITH SALPINGECTOMY Possible BSO;  Surgeon: Megan Salon, MD;  Location: [facility];  Service: Gynecology;  Laterality: Bilateral;  possible BSO   UPPER GI ENDOSCOPY     reflux   WISDOM TOOTH EXTRACTION      FAMILY HISTORY Family History  Problem Relation Age of Onset   Diabetes Mother    Hypertension Mother    Colon polyps Father        section of colon removed-unknown if CA related   Diabetes Father    Hypertension Sister    Colon polyps Brother 42    Colon polyps Paternal Grandfather 76   Colon cancer Paternal Grandfather 29   Breast cancer Cousin 16   Esophageal cancer Neg Hx    Rectal cancer Neg Hx    Stomach cancer Neg Hx     SOCIAL HISTORY Social History   Tobacco Use   Smoking status: Never   Smokeless tobacco: Never  Vaping Use   Vaping Use: Never used  Substance Use  Topics   Alcohol use: Yes    Comment: social wine   Drug use: No         OPHTHALMIC EXAM:  Base Eye Exam     Visual Acuity (ETDRS)       Right Left   Dist cc 20/20 20/30 -1    Correction: Glasses         Tonometry (Tonopen, 9:39 AM)       Right Left   Pressure 19 18         Pupils       Pupils Dark Light APD   Right PERRL 4 3 None   Left PERRL 4 3 None         Visual Fields (Counting fingers)       Left Right    Full Full         Extraocular Movement       Right Left    Full Full         Neuro/Psych     Oriented x3: Yes   Mood/Affect: Normal         Dilation     Both eyes: 1.0% Mydriacyl, 2.5% Phenylephrine @ 9:39 AM           Slit Lamp and Fundus Exam     External Exam       Right Left   External Normal Normal         Slit Lamp Exam       Right Left   Lids/Lashes Normal Normal   Conjunctiva/Sclera White and quiet White and quiet   Cornea Clear Clear   Anterior Chamber Deep and quiet Deep and quiet   Iris Round and reactive Round and reactive   Lens 1+ Nuclear sclerosis 1+ Nuclear sclerosis   Anterior Vitreous Normal Normal         Fundus Exam       Right Left   Posterior Vitreous Normal Normal   Disc Normal Normal   C/D Ratio 0.45 0.45   Macula Normal Retinal pigment epithelial mottling   Vessels Normal Normal   Periphery Normal Normal            IMAGING AND PROCEDURES  Imaging and Procedures for 10/01/21  OCT, Retina - OU - Both Eyes       Right Eye Quality was good. Scan locations included subfoveal. Central Foveal Thickness: 261. Findings include normal  observations, no SRF.   Left Eye Quality was good. Scan locations included subfoveal. Central Foveal Thickness: 236. Progression has improved. Findings include no SRF, subretinal hyper-reflective material.   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             ASSESSMENT/PLAN:  Serous detachment of retinal pigment epithelium of left eye Central serous retinopathy, recurrent from 2 years previous but overall less active as compared to that time.  November 2020.  In the interim patient has undergone topical therapy using topical dorzolamide she is also tried episodic uses of eplerenone the notes from Ebony Cargo at [facility] reviewed.  She reports center involved OCT likely October 2021 centered subfield thickness of 324 m in today at 17, which is overall improved.  Therefore I recommend the patient not to use systemic eplerenone, continue on topical dorzolamide 2-3 times daily and moreover to continue to look for spontaneous improvement OS.  If however symptoms recur or do not improve,  we could perform fluorescein angiography again and if treatable consider micro pulse subthreshold navigated laser with NAVILAS laser as has been performed in the past for noncentral involved central serous retinopathy superior to the fovea performed in 2020.  I have explained to the patient that this is nonscarring fine, and less likely to trigger late onset CNVM formation.  She expressed concern that persistent subretinal fluid can cause damage.  I informed the patient that that damage is already occurred and still will occur because of the quick sick RPE" from having the underlying condition the first place delivering a lifetime risk of wet macular degeneration developing in the left eye which cannot be avoided.  Central serous chorioretinopathy of left eye Central serous  retinopathy, recurrent from 2 years previous but overall less active as compared to that time.  November 2020.  In the interim patient has undergone topical therapy using topical dorzolamide she is also tried episodic uses of eplerenone the notes from Ebony Cargo at [facility] reviewed.  She reports center involved OCT likely October 2021 centered subfield thickness of 324 m in today at 48, which is overall improved.  Therefore I recommend the patient not to use systemic eplerenone, continue on topical dorzolamide 2-3 times daily and moreover to continue to look for spontaneous improvement OS.  If however symptoms recur or do not improve, we could perform fluorescein angiography again and if treatable consider micro pulse subthreshold navigated laser with NAVILAS laser as has been performed in the past for noncentral involved central serous retinopathy superior to the fovea performed in 2020.  I have explained to the patient that this is nonscarring fine, and less likely to trigger late onset CNVM formation.  She expressed concern that persistent subretinal fluid can cause damage.  I informed the patient that that damage is already occurred and still will occur because of the quick sick RPE" from having the underlying condition the first place delivering a lifetime risk of wet macular degeneration developing in the left eye which cannot be avoided.     ICD-10-CM   1. Central serous chorioretinopathy of left eye  H35.712 OCT, Retina - OU - Both Eyes    Color Fundus Photography Optos - OU - Both Eyes    2. Serous detachment of retinal pigment epithelium of left eye  H35.722       1.  OS, with mild chronic recurrent central serous retinopathy, CS CR.  Recommend observation and continue with topical dorzolamide at this time.  If condition continues to improve no further therapy  2.  If condition worsens at any time we can offer repeat fluorescein angiography and consider micro pulse  subthreshold navigated laser with yellow NAVILAS laser as done but in the past with good temporary improvement  3.  Ophthalmic Meds Ordered this visit:  No orders of the defined types were placed in this encounter.      Return in about 8 weeks (around 11/26/2021) for DILATE OU, COLOR FP, OCT,, possible [place] fluorescein angiography.  There are no Patient Instructions on file for this visit.   Explained the diagnoses, plan, and follow up with the patient and they expressed understanding.  Patient expressed understanding of the importance of proper follow up care.   Clent Demark Jana Swartzlander M.D. Diseases & Surgery of the Retina and Vitreous [facility] 10/01/21     Abbreviations: M myopia (nearsighted); A astigmatism; H hyperopia (farsighted); P presbyopia; Mrx spectacle prescription;  CTL contact lenses; OD right  eye; OS left eye; OU both eyes  XT exotropia; ET esotropia; PEK punctate epithelial keratitis; PEE punctate epithelial erosions; DES dry eye syndrome; MGD meibomian gland dysfunction; ATs artificial tears; PFAT's preservative free artificial tears; [place] nuclear sclerotic cataract; PSC posterior subcapsular cataract; ERM epi-retinal membrane; PVD posterior vitreous detachment; RD retinal detachment; DM diabetes mellitus; DR diabetic retinopathy; NPDR non-proliferative diabetic retinopathy; PDR proliferative diabetic retinopathy; CSME clinically significant macular edema; DME diabetic macular edema; dbh dot blot hemorrhages; CWS cotton wool spot; POAG primary open angle glaucoma; C/D cup-to-disc ratio; HVF humphrey visual field; GVF goldmann visual field; OCT optical coherence tomography; IOP intraocular pressure; BRVO Branch retinal vein occlusion; CRVO central retinal vein occlusion; CRAO central retinal artery occlusion; BRAO branch retinal artery occlusion; RT retinal tear; SB scleral buckle; PPV pars plana vitrectomy; VH Vitreous hemorrhage; PRP panretinal laser  photocoagulation; IVK intravitreal kenalog; VMT vitreomacular traction; MH Macular hole;  NVD neovascularization of the disc; NVE neovascularization elsewhere; AREDS age related eye disease study; ARMD age related macular degeneration; POAG primary open angle glaucoma; EBMD epithelial/anterior basement membrane dystrophy; ACIOL anterior chamber intraocular lens; IOL intraocular lens; PCIOL posterior chamber intraocular lens; Phaco/IOL phacoemulsification with intraocular lens placement; [place] photorefractive keratectomy; LASIK laser assisted in situ keratomileusis; HTN hypertension; DM diabetes mellitus; COPD chronic obstructive pulmonary disease

## 2021-10-08 ENCOUNTER — Other Ambulatory Visit: Payer: Self-pay

## 2021-10-08 ENCOUNTER — Ambulatory Visit (HOSPITAL_BASED_OUTPATIENT_CLINIC_OR_DEPARTMENT_OTHER): Payer: Managed Care, Other (non HMO) | Admitting: Obstetrics & Gynecology

## 2021-10-08 ENCOUNTER — Encounter (HOSPITAL_BASED_OUTPATIENT_CLINIC_OR_DEPARTMENT_OTHER): Payer: Self-pay | Admitting: Obstetrics & Gynecology

## 2021-10-08 VITALS — BP 157/87 | HR 84 | Ht 62.0 in | Wt 132.2 lb

## 2021-10-08 DIAGNOSIS — N393 Stress incontinence (female) (male): Secondary | ICD-10-CM | POA: Diagnosis not present

## 2021-10-08 DIAGNOSIS — N816 Rectocele: Secondary | ICD-10-CM | POA: Diagnosis not present

## 2021-10-08 MED ORDER — NONFORMULARY OR COMPOUNDED ITEM: 3 refills | Status: DC

## 2021-10-08 NOTE — Progress Notes (Signed)
GYNECOLOGY  VISIT  CC:   vaginal bulge  HPI: 61 y.o. G2P2 [name] here for complaint of vaginal bulge.  She has some tissue burning as well.  She feels this is related to how well she eliminates her bowls.  She feels that she has more symptoms when her bowel is looser.  She has less symptoms if her bowels are more firm.  She typically doesn't need fiber to help with bowel movements.  Denies vaginal bleeding.  She does have a lot of pressure when symptoms are more present.  She does use splinting sometime with bowel movements.    Had colonoscopy 05/2021 with Dr. Fuller Plan.  He did not think she needed hemorrhoid treatment.  She does have some urinary incontinence with sneezing and laughing or coughing.  Not terrible but frustrating at times.  GYNECOLOGIC HISTORY: Patient's last menstrual period was 12/31/2017 (approximate). Contraception: hysterectomy Menopausal hormone therapy: none  Patient Active Problem List   Diagnosis Date Noted   Central serous chorioretinopathy of left eye 06/06/2020   Serous detachment of retinal pigment epithelium of left eye 06/06/2020   Gastroesophageal reflux disease 05/28/2019   Spondylolysis of lumbosacral region 02/01/2014    Past Medical History:  Diagnosis Date   Adenomyosis    Anemia    Anxiety    situational per pt.   Arthritis    RIGHT knee/ bilateral hands/RIGHT foot   Bursitis of hip    left   Chronic kidney disease    hx of kidney stones   Floaters, bilateral    GERD (gastroesophageal reflux disease)    on meds   Hemorrhoids    Hiatal hernia    Hyperlipidemia    borderline, controlled with diet per pt.   Hypertension    IBS (irritable bowel syndrome)    Seasonal allergies    Seasonal allergies    SVD (spontaneous vaginal delivery)    x 2   Thyroid disease    goiter and cyst on thyroid-followed by [place] yearly   Tubular adenoma of colon 04/2011   White coat syndrome without diagnosis of hypertension    while  in office/sx-anxiety increases as well    Past Surgical History:  Procedure Laterality Date   COLONOSCOPY  2017   MS-MAC-suprep(exc)-hems-(hx of TA)-5 yr recall   CYSTOSCOPY N/A 01/12/2018   Procedure: CYSTOSCOPY;  Surgeon: Megan Salon, MD;  Location: [facility];  Service: Gynecology;  Laterality: N/A;   EXTRACORPOREAL SHOCK WAVE LITHOTRIPSY Right 10/09/2020   Procedure: EXTRACORPOREAL SHOCK WAVE LITHOTRIPSY (ESWL);  Surgeon: Janith Lima, MD;  Location: [facility];  Service: Urology;  Laterality: Right;   MAXIMUM ACCESS (MAS)POSTERIOR LUMBAR INTERBODY FUSION (PLIF) 1 LEVEL N/A 02/01/2014   Procedure: LUMBAR FIVE-SACFAL ONE POSTERIOR LUMBAR INTERBODY FUSION,GIL PROCEDURE;  Surgeon: Erline Levine, MD;  Location: [facility];  Service: Neurosurgery;  Laterality: N/A;   MENISCUS REPAIR Bilateral    R 07/2018, L 09/2018   POLYPECTOMY     TONSILLECTOMY     TOTAL LAPAROSCOPIC HYSTERECTOMY WITH SALPINGECTOMY Bilateral 01/12/2018   Procedure: TOTAL LAPAROSCOPIC HYSTERECTOMY WITH SALPINGECTOMY Possible BSO;  Surgeon: Megan Salon, MD;  Location: [facility];  Service: Gynecology;  Laterality: Bilateral;  possible BSO   UPPER GI ENDOSCOPY     reflux   WISDOM TOOTH EXTRACTION      MEDS:   Current Outpatient Medications on File Prior to Visit  Medication Sig Dispense Refill   COLLAGEN PO Take by mouth.  dorzolamide (TRUSOPT) 2 % ophthalmic solution Place 1 drop into the left eye 3 (three) times daily. 10 mL 6   Fiber CHEW Chew 1 tablet by mouth daily as needed.     KRILL OIL PO Take 1 capsule by mouth daily.     Multiple Vitamin (MULTIVITAMIN) tablet Take 1 tablet by mouth daily.     Multiple Vitamins-Minerals (EYE VITAMINS PO) Take 1 capsule by mouth 2 (two) times daily.     omeprazole (PRILOSEC) 20 MG capsule TAKE 1 CAPSULE BY MOUTH EVERY DAY 90 capsule 3   Probiotic Product (DIGESTIVE ADVANTAGE PO) Take 1 tablet by mouth daily.     tretinoin (RETIN-A) 0.025 % cream  tretinoin 0.025 % topical cream  APPLY CREAM TOPICALLY TO AFFECTED AREA AT BEDTIME     Turmeric 500 MG CAPS Take by mouth daily.     VITAMIN D, ERGOCALCIFEROL, PO Take 1,000 Units by mouth daily.     eplerenone (INSPRA) 25 MG tablet Take 0.5 tablets by mouth daily. (Patient not taking: Reported on 06/06/2021)     OPZELURA 1.5 % CREA Apply 1 Dose topically daily. (Patient not taking: Reported on 10/08/2021)     OVER THE COUNTER MEDICATION Take 1 Dose by mouth daily. OAT powder (Patient not taking: Reported on 10/08/2021)     No current facility-administered medications on file prior to visit.    ALLERGIES: Codeine and Sulfa antibiotics  Family History  Problem Relation Age of Onset   Diabetes Mother    Hypertension Mother    Colon polyps Father        section of colon removed-unknown if CA related   Diabetes Father    Hypertension Sister    Colon polyps Brother 21   Colon polyps Paternal Grandfather 5   Colon cancer Paternal Grandfather 29   Breast cancer Cousin 45   Esophageal cancer Neg Hx    Rectal cancer Neg Hx    Stomach cancer Neg Hx     SH:  married, non smoker  Review of Systems  Gastrointestinal:        Rectal pressure  All other systems reviewed and are negative.  PHYSICAL EXAMINATION:    BP (!) 157/87 (BP Location: Left Arm, Patient Position: Sitting, Cuff Size: Normal)    Pulse 84    Ht _0  (1.575 m) Comment: reported   Wt 132 lb 3.2 oz (60 kg)    LMP 12/31/2017 (Approximate) Comment: bleeding irregular   BMI 24.18 kg/m     General appearance: alert, cooperative and appears stated age Lymph:  no inguinal LAD noted  Pelvic: External genitalia:  no lesions              Urethra:  normal appearing urethra with no masses, tenderness or lesions              Bartholins and Skenes: normal                 Vagina: normal appearing vagina with normal color and discharge, no lesions, rectocele present              Cervix: absent              Bimanual Exam:  Uterus:   uterus absent              Adnexa: no mass, fullness, tenderness              Rectovaginal: Yes.  .  Confirms.  Anus:  normal sphincter tone, no lesions  Chaperone, [name], CMA, was present for exam.  Assessment/Plan: 1. Rectocele - her symptoms have been consistent with this in the past but there has never been any physical exam findings however, it is obvious today on exam so will refer for surgical consideration - Ambulatory referral to Urogynecology  - will start estradiol compounded 0.02% cream, one ml pv and on external tissue twice weekly  2. SUI (stress urinary incontinence, female) - Ambulatory referral to Urogynecology

## 2021-10-08 NOTE — Patient Instructions (Signed)
[facility] [address], [place], [place] [address]  [phone] Monday - Friday 9:00AM - 7:00PM   Saturday 10:00AM - 3:00PM   Sunday Closed

## 2021-10-11 NOTE — Telephone Encounter (Signed)
April 01, 2021 Pereyra, Leatta D to Capitan, Amy S, PA-C     12:33 PM pt called- internal hemorrhoids flaring, swollen, painful, small amt bleeding- called Anusol HC supp BID x 5days then QDx 5, and Anal pram HC 2.5 % QID , sitz baths

## 2021-10-15 DIAGNOSIS — N816 Rectocele: Secondary | ICD-10-CM | POA: Insufficient documentation

## 2021-10-15 DIAGNOSIS — N393 Stress incontinence (female) (male): Secondary | ICD-10-CM | POA: Insufficient documentation

## 2021-10-15 DIAGNOSIS — E042 Nontoxic multinodular goiter: Secondary | ICD-10-CM | POA: Insufficient documentation

## 2021-10-15 DIAGNOSIS — E7800 Pure hypercholesterolemia, unspecified: Secondary | ICD-10-CM | POA: Insufficient documentation

## 2021-11-26 ENCOUNTER — Encounter (INDEPENDENT_AMBULATORY_CARE_PROVIDER_SITE_OTHER): Payer: Managed Care, Other (non HMO) | Admitting: Ophthalmology

## 2021-11-27 ENCOUNTER — Other Ambulatory Visit: Payer: Self-pay

## 2021-11-27 ENCOUNTER — Ambulatory Visit: Payer: Managed Care, Other (non HMO) | Admitting: Obstetrics and Gynecology

## 2021-11-27 ENCOUNTER — Encounter: Payer: Self-pay | Admitting: Obstetrics and Gynecology

## 2021-11-27 VITALS — BP 147/82 | HR 83 | Ht 62.0 in | Wt 132.0 lb

## 2021-11-27 DIAGNOSIS — R350 Frequency of micturition: Secondary | ICD-10-CM

## 2021-11-27 DIAGNOSIS — N816 Rectocele: Secondary | ICD-10-CM | POA: Diagnosis not present

## 2021-11-27 DIAGNOSIS — N393 Stress incontinence (female) (male): Secondary | ICD-10-CM | POA: Diagnosis not present

## 2021-11-27 DIAGNOSIS — N993 Prolapse of vaginal vault after hysterectomy: Secondary | ICD-10-CM | POA: Diagnosis not present

## 2021-11-27 LAB — POCT URINALYSIS DIPSTICK
Appearance: NORMAL
Bilirubin, UA: NEGATIVE
Blood, UA: NEGATIVE
Glucose, UA: NEGATIVE
Ketones, UA: NEGATIVE
Leukocytes, UA: NEGATIVE
Nitrite, UA: NEGATIVE
Protein, UA: NEGATIVE
Spec Grav, UA: 1.025 (ref 1.010–1.025)
Urobilinogen, UA: 0.2 E.U./dL
pH, UA: 5.5 (ref 5.0–8.0)

## 2021-11-27 NOTE — Progress Notes (Signed)
[facility] New Patient Evaluation and Consultation  Referring Provider: Megan Salon, MD PCP: Shirline Frees, MD Date of Service: 11/27/2021  SUBJECTIVE Chief Complaint: New Patient (Initial Visit) Shelly Gilbert is a 61 y.o. female here for a evaluation in prolapse./)  History of Present Illness: Shelly Gilbert is a 60 y.o. White or Caucasian female seen in consultation at the request of Dr. Sabra Heck for evaluation of prolapse.    Review of records from Dr Sabra Heck significant for: Has worsening vaginal bulge with bowel movements. Has some leakage with sneeze, laugh.   Urinary Symptoms: Leaks urine with cough/ sneeze and with a full bladder Leaks seldomly Pad use: none She is not bothered by her UI symptoms.  Day time voids 4.  Nocturia: 2-3 times per night to void. Voiding dysfunction: she empties her bladder well.  does not use a catheter to empty bladder.  When urinating, she feels she has no difficulties  UTIs:  0  UTI's in the last year.   Reports history of blood in urine and kidney or bladder stones- about a year ago. [facility].   Pelvic Organ Prolapse Symptoms:                  She Admits to a feeling of a bulge the vaginal area. It has been present since 2019- ever since her hysterectomy.  She Admits to seeing a bulge.  This bulge is bothersome. Has tried a pessary in the past.   Bowel Symptom: Bowel movements: 1 time(s) per day Stool consistency: soft  Straining: yes, sometimes Splinting: yes.  Incomplete evacuation: no.  She Denies accidental bowel leakage / fecal incontinence Bowel regimen: none Occasionally will eat cheese and has more constipation and will not feel the bulge as much.  Last colonoscopy: Date 2022, Results - polyp removed  Sexual Function Sexually active: yes.  Pain with sex: No  Pelvic Pain Denies pelvic pain   Past Medical History:  Past Medical History:  Diagnosis Date   Adenomyosis    Anemia     Anxiety    situational per pt.   Arthritis    RIGHT knee/ bilateral hands/RIGHT foot   Bursitis of hip    left   Chronic kidney disease    hx of kidney stones   Floaters, bilateral    GERD (gastroesophageal reflux disease)    on meds   Hemorrhoids    Hiatal hernia    Hyperlipidemia    borderline, controlled with diet per pt.   Hypertension    IBS (irritable bowel syndrome)    Morphea    Seasonal allergies    Seasonal allergies    SVD (spontaneous vaginal delivery)    x 2   Thyroid disease    goiter and cyst on thyroid-followed by [place] yearly   Tubular adenoma of colon 04/2011   White coat syndrome without diagnosis of hypertension    while in office/sx-anxiety increases as well     Past Surgical History:   Past Surgical History:  Procedure Laterality Date   COLONOSCOPY  2017   MS-MAC-suprep(exc)-hems-(hx of TA)-5 yr recall   CYSTOSCOPY N/A 01/12/2018   Procedure: CYSTOSCOPY;  Surgeon: Megan Salon, MD;  Location: [facility];  Service: Gynecology;  Laterality: N/A;   EXTRACORPOREAL SHOCK WAVE LITHOTRIPSY Right 10/09/2020   Procedure: EXTRACORPOREAL SHOCK WAVE LITHOTRIPSY (ESWL);  Surgeon: Janith Lima, MD;  Location: [facility];  Service: Urology;  Laterality: Right;   MAXIMUM ACCESS (MAS)POSTERIOR  LUMBAR INTERBODY FUSION (PLIF) 1 LEVEL N/A 02/01/2014   Procedure: LUMBAR FIVE-SACFAL ONE POSTERIOR LUMBAR INTERBODY FUSION,GIL PROCEDURE;  Surgeon: Erline Levine, MD;  Location: [facility];  Service: Neurosurgery;  Laterality: N/A;   MENISCUS REPAIR Bilateral    R 07/2018, L 09/2018   POLYPECTOMY     TONSILLECTOMY     TOTAL LAPAROSCOPIC HYSTERECTOMY WITH SALPINGECTOMY Bilateral 01/12/2018   Procedure: TOTAL LAPAROSCOPIC HYSTERECTOMY WITH SALPINGECTOMY Possible BSO;  Surgeon: Megan Salon, MD;  Location: [facility];  Service: Gynecology;  Laterality: Bilateral;  possible BSO   UPPER GI ENDOSCOPY     reflux   WISDOM TOOTH EXTRACTION       Past OB/GYN  History: OB History  Gravida Para Term Preterm AB Living  _0 SAB IAB Ectopic Multiple Live Births          2    # Outcome Date GA Lbr Len/2nd Weight Sex Delivery Anes PTL Lv  2 Para 03/16/94    M Vag-Spont     1 Para 02/24/90    M Vag-Forceps      S/p hysterectomy   Medications: She has a current medication list which includes the following prescription(s): collagen, dorzolamide, elderberry, eplerenone, fiber, krill oil, multivitamin, multiple vitamins-minerals, NONFORMULARY OR COMPOUNDED ITEM, omeprazole, opzelura, OVER THE COUNTER MEDICATION, probiotic product, tretinoin, turmeric, and ergocalciferol.   Allergies: Patient is allergic to codeine and sulfa antibiotics.   Social History:  Social History   Tobacco Use   Smoking status: Never   Smokeless tobacco: Never  Vaping Use   Vaping Use: Never used  Substance Use Topics   Alcohol use: Yes    Comment: social wine   Drug use: No    Relationship status: married She lives with husband.   She is employed- owns businesses. Regular exercise: Yes: treadmill, bike History of abuse: No  Family History:   Family History  Problem Relation Age of Onset   Diabetes Mother    Hypertension Mother    Cancer Father    Colon polyps Father        section of colon removed-unknown if CA related   Diabetes Father    Hypertension Sister    Colon polyps Brother 26   Colon polyps Paternal Grandfather 27   Colon cancer Paternal Grandfather 8   Breast cancer Cousin 61   Esophageal cancer Neg Hx    Rectal cancer Neg Hx    Stomach cancer Neg Hx      Review of Systems: Review of Systems  Constitutional:  Negative for fever, malaise/fatigue and weight loss.  Respiratory:  Negative for cough, shortness of breath and wheezing.   Cardiovascular:  Negative for chest pain, palpitations and leg swelling.  Gastrointestinal:  Negative for abdominal pain and blood in stool.  Genitourinary:  Negative for dysuria.  Musculoskeletal:   Negative for myalgias.  Skin:  Negative for rash.  Neurological:  Negative for dizziness and headaches.  Endo/Heme/Allergies:  Bruises/bleeds easily.  Psychiatric/Behavioral:  Negative for depression. The patient is not nervous/anxious.     OBJECTIVE Physical Exam: Vitals:   11/27/21 1536  BP: (!) 147/82  Pulse: 83  Weight: 132 lb (59.9 kg)  Height: _1  (1.575 m)    Physical Exam Constitutional:      General: She is not in acute distress. Pulmonary:     Effort: Pulmonary effort is normal.  Abdominal:     General: There is no distension.  Palpations: Abdomen is soft.     Tenderness: There is no abdominal tenderness. There is no rebound.  Musculoskeletal:        General: No swelling. Normal range of motion.  Skin:    General: Skin is warm and dry.     Findings: No rash.  Neurological:     Mental Status: She is alert and oriented to person, place, and time.  Psychiatric:        Mood and Affect: Mood normal.        Behavior: Behavior normal.     GU / Detailed Urogynecologic Evaluation:  Pelvic Exam: Normal external female genitalia; Bartholin's and Skene's glands normal in appearance; urethral meatus normal in appearance, no urethral masses or discharge.   CST: negative  s/p hysterectomy: Speculum exam reveals normal vaginal mucosa with  atrophy and normal vaginal cuff.  Adnexa normal adnexa.     Pelvic floor strength II/V  Pelvic floor musculature: Right levator non-tender, Right obturator non-tender, Left levator non-tender, Left obturator non-tender  POP-Q:   POP-Q  -3                                            Aa   -3                                           Ba  -5.5                                              C   4                                            Gh  4                                            Pb  8                                            tvl   1                                            Ap  1                                             Bp  D     Rectal Exam:  Normal external rectum  Post-Void Residual (PVR) by Bladder Scan: In order to evaluate bladder emptying, we discussed obtaining a postvoid residual and she agreed to this procedure.  Procedure: The ultrasound unit was placed on the patient's abdomen in the suprapubic region after the patient had voided. A PVR of 14 ml was obtained by bladder scan.  Laboratory Results: POC urine: negative   ASSESSMENT AND PLAN Ms. Trentham is a 61 y.o. with:  1. Prolapse of posterior vaginal wall   2. Vaginal vault prolapse after hysterectomy   3. Urinary frequency   4. SUI (stress urinary incontinence, female)    Stage I anterior, Stage II posterior, Stage I apical prolapse - For treatment of pelvic organ prolapse, we discussed options for management including expectant management, conservative management, and surgical management, such as Kegels, a pessary, pelvic floor physical therapy, and specific surgical procedures (posterior repair with SSLF). - She is interested potentially in surgery but watches her grandchild currently, and is not sure it is feasible for her to take time off from lifting etc at this time. Handout provided on surgical option.   2. SUI - if she did want to pursue surgery, would recommend simple CMG testing to further assess bladder leakage and need for a sling at the same time.   Return as needed   Jaquita Folds, MD

## 2021-12-03 ENCOUNTER — Encounter (INDEPENDENT_AMBULATORY_CARE_PROVIDER_SITE_OTHER): Payer: Managed Care, Other (non HMO) | Admitting: Ophthalmology

## 2021-12-22 ENCOUNTER — Other Ambulatory Visit: Payer: Self-pay

## 2021-12-22 ENCOUNTER — Emergency Department (HOSPITAL_COMMUNITY)
Admission: EM | Admit: 2021-12-22 | Discharge: 2021-12-22 | Disposition: A | Discharge status: Home / Self Care | Payer: Managed Care, Other (non HMO) | Attending: Emergency Medicine | Admitting: Emergency Medicine

## 2021-12-22 ENCOUNTER — Encounter (HOSPITAL_COMMUNITY): Payer: Self-pay

## 2021-12-22 DIAGNOSIS — R319 Hematuria, unspecified: Secondary | ICD-10-CM

## 2021-12-22 DIAGNOSIS — N189 Chronic kidney disease, unspecified: Secondary | ICD-10-CM | POA: Diagnosis not present

## 2021-12-22 DIAGNOSIS — R3915 Urgency of urination: Secondary | ICD-10-CM | POA: Diagnosis not present

## 2021-12-22 DIAGNOSIS — R112 Nausea with vomiting, unspecified: Secondary | ICD-10-CM | POA: Diagnosis not present

## 2021-12-22 DIAGNOSIS — R1030 Lower abdominal pain, unspecified: Secondary | ICD-10-CM | POA: Insufficient documentation

## 2021-12-22 DIAGNOSIS — Z79899 Other long term (current) drug therapy: Secondary | ICD-10-CM | POA: Diagnosis not present

## 2021-12-22 DIAGNOSIS — R310 Gross hematuria: Secondary | ICD-10-CM | POA: Diagnosis not present

## 2021-12-22 DIAGNOSIS — I129 Hypertensive chronic kidney disease with stage 1 through stage 4 chronic kidney disease, or unspecified chronic kidney disease: Secondary | ICD-10-CM | POA: Diagnosis not present

## 2021-12-22 DIAGNOSIS — R300 Dysuria: Secondary | ICD-10-CM | POA: Insufficient documentation

## 2021-12-22 LAB — CBC WITH DIFFERENTIAL/PLATELET
Abs Immature Granulocytes: 0.18 10*3/uL — ABNORMAL HIGH (ref 0.00–0.07)
Basophils Absolute: 0 10*3/uL (ref 0.0–0.1)
Basophils Relative: 0 %
Eosinophils Absolute: 0 10*3/uL (ref 0.0–0.5)
Eosinophils Relative: 0 %
HCT: 44.7 % (ref 36.0–46.0)
Hemoglobin: 14.4 g/dL (ref 12.0–15.0)
Immature Granulocytes: 2 %
Lymphocytes Relative: 4 %
Lymphs Abs: 0.4 10*3/uL — ABNORMAL LOW (ref 0.7–4.0)
MCH: 28.4 pg (ref 26.0–34.0)
MCHC: 32.2 g/dL (ref 30.0–36.0)
MCV: 88.2 fL (ref 80.0–100.0)
Monocytes Absolute: 0.5 10*3/uL (ref 0.1–1.0)
Monocytes Relative: 5 %
Neutro Abs: 9.7 10*3/uL — ABNORMAL HIGH (ref 1.7–7.7)
Neutrophils Relative %: 89 %
Platelets: 227 10*3/uL (ref 150–400)
RBC: 5.07 MIL/uL (ref 3.87–5.11)
RDW: 12.3 % (ref 11.5–15.5)
WBC: 10.9 10*3/uL — ABNORMAL HIGH (ref 4.0–10.5)
nRBC: 0 % (ref 0.0–0.2)

## 2021-12-22 LAB — URINALYSIS, ROUTINE W REFLEX MICROSCOPIC
Bacteria, UA: NONE SEEN
Bilirubin Urine: NEGATIVE
Glucose, UA: 50 mg/dL — AB
Ketones, ur: 5 mg/dL — AB
Nitrite: NEGATIVE
Protein, ur: 30 mg/dL — AB
Specific Gravity, Urine: 1.017 (ref 1.005–1.030)
pH: 5 (ref 5.0–8.0)

## 2021-12-22 LAB — BASIC METABOLIC PANEL
Anion gap: 12 (ref 5–15)
BUN: 17 mg/dL (ref 6–20)
CO2: 21 mmol/L — ABNORMAL LOW (ref 22–32)
Calcium: 9.8 mg/dL (ref 8.9–10.3)
Chloride: 100 mmol/L (ref 98–111)
Creatinine, Ser: 0.77 mg/dL (ref 0.44–1.00)
GFR, Estimated: >60 mL/min (ref >60)
Glucose, Bld: 173 mg/dL — ABNORMAL HIGH (ref 70–99)
Potassium: 3.4 mmol/L — ABNORMAL LOW (ref 3.5–5.1)
Sodium: 133 mmol/L — ABNORMAL LOW (ref 135–145)

## 2021-12-22 MED ORDER — SODIUM CHLORIDE 0.9 % IV BOLUS
1000.0000 mL | Freq: Once | INTRAVENOUS | Status: AC
  Administered 2021-12-22: 1000 mL via INTRAVENOUS

## 2021-12-22 MED ORDER — ONDANSETRON 4 MG PO TBDP: 4.0000 mg | ORAL_TABLET | Freq: Three times a day (TID) | ORAL | 0 refills | Status: DC | PRN

## 2021-12-22 MED ORDER — FENTANYL CITRATE PF 50 MCG/ML IJ SOSY
50.0000 ug | PREFILLED_SYRINGE | Freq: Once | INTRAMUSCULAR | Status: AC
  Administered 2021-12-22: 50 ug via INTRAVENOUS
  Filled 2021-12-22: qty 1

## 2021-12-22 MED ORDER — ONDANSETRON HCL 4 MG/2ML IJ SOLN
4.0000 mg | Freq: Once | INTRAMUSCULAR | Status: AC
Start: 2021-12-22 — End: 2021-12-22
  Administered 2021-12-22: 4 mg via INTRAVENOUS
  Filled 2021-12-22: qty 2

## 2021-12-22 MED ORDER — OXYCODONE-ACETAMINOPHEN 5-325 MG PO TABS: 1.0000 | ORAL_TABLET | Freq: Three times a day (TID) | ORAL | 0 refills | Status: DC | PRN

## 2021-12-22 MED ORDER — ACETAMINOPHEN 500 MG PO TABS
1000.0000 mg | ORAL_TABLET | Freq: Once | ORAL | Status: AC
  Administered 2021-12-22: 1000 mg via ORAL
  Filled 2021-12-22: qty 2

## 2021-12-22 MED ORDER — METOCLOPRAMIDE HCL 5 MG/ML IJ SOLN
10.0000 mg | Freq: Once | INTRAMUSCULAR | Status: AC | PRN
  Administered 2021-12-22: 10 mg via INTRAVENOUS
  Filled 2021-12-22: qty 2

## 2021-12-22 NOTE — ED Triage Notes (Signed)
Patient is having abdominal pain, feeling like she cannot empty her bladder. Having blood in her urine. Patient has been vomiting. Feeling sick to her stomach when pressed upon.  ?

## 2021-12-22 NOTE — ED Provider Notes (Signed)
?  Physical Exam  ?BP 135/79 (BP Location: Right Arm)   Pulse 100   Temp (!) 100.4 ?F (38 ?C) (Oral)   Resp 16   Ht '5\' 2"'$  (1.575 m)   Wt 59 kg   LMP 12/31/2017 (Approximate) Comment: bleeding irregular  SpO2 98%   BMI 23.78 kg/m?  ? ?Physical Exam ? ?Procedures  ?Procedures ? ?ED Course / MDM  ?  ?Medical Decision Making ?Amount and/or Complexity of Data Reviewed ?Labs: ordered. ? ?Risk ?OTC drugs. ?Prescription drug management. ? ? ?CT scan reassuring.  Received in signout.  No urinary retention.-Vomiting.  Feels better.  Urine does not show infection.  Cultures been sent.  We will continue antibiotics that she been prescribed.  We will check with urology as their culture should hopefully return today or tomorrow we will discharge ? ? ? ? ?  ?Davonna Belling, MD ?12/22/21 1525 ? ?

## 2021-12-22 NOTE — Discharge Instructions (Addendum)
Follow-up with urology.  The culture should come back later today or tomorrow ?

## 2021-12-22 NOTE — ED Provider Notes (Signed)
[facility] Provider Note: Georgena Spurling, MD, FACEP  CSN: [id number] MRN: [id number] ARRIVAL: 12/22/21 at 0534 ROOM: [place]  Abdominal Pain   HISTORY OF PRESENT ILLNESS  12/22/21 5:45 AM Shelly Gilbert is a 61 y.o. female history of kidney stones who required lithotripsy in December 2021.  She is here with dysuria, gross hematuria and suprapubic pain that began the day before yesterday.  She was seen at [facility] urology yesterday where she was given shots of antibiotic, Phenergan and an analgesic (Toradol?).  She had a CT performed which showed no evidence of acute abnormality in the abdomen or pelvis. Her hematuria has improved but because continues to have suprapubic pain, urinary urgency, voiding of small amounts and persistent nausea and vomiting.  The vomiting has progressed to retching.  She states her pain is severe and she has some mild right flank pain as well.  She had a low-grade fever yesterday.   Past Medical History:  Diagnosis Date   Adenomyosis    Anemia    Anxiety    situational per pt.   Arthritis    RIGHT knee/ bilateral hands/RIGHT foot   Bursitis of hip    left   Chronic kidney disease    hx of kidney stones   Floaters, bilateral    GERD (gastroesophageal reflux disease)    on meds   Hemorrhoids    Hiatal hernia    Hyperlipidemia    borderline, controlled with diet per pt.   Hypertension    IBS (irritable bowel syndrome)    Morphea    Seasonal allergies    Seasonal allergies    SVD (spontaneous vaginal delivery)    x 2   Thyroid disease    goiter and cyst on thyroid-followed by [place] yearly   Tubular adenoma of colon 04/2011   White coat syndrome without diagnosis of hypertension    while in office/sx-anxiety increases as well    Past Surgical History:  Procedure Laterality Date   COLONOSCOPY  2017   MS-MAC-suprep(exc)-hems-(hx of TA)-5 yr recall   CYSTOSCOPY N/A 01/12/2018   Procedure: CYSTOSCOPY;  Surgeon: Megan Salon, MD;  Location: [facility];  Service: Gynecology;  Laterality: N/A;   EXTRACORPOREAL SHOCK WAVE LITHOTRIPSY Right 10/09/2020   Procedure: EXTRACORPOREAL SHOCK WAVE LITHOTRIPSY (ESWL);  Surgeon: Janith Lima, MD;  [facility];  Service: Urology;  Laterality: Right;   MAXIMUM ACCESS (MAS)POSTERIOR LUMBAR INTERBODY FUSION (PLIF) 1 LEVEL N/A 02/01/2014   Procedure: LUMBAR FIVE-SACFAL ONE POSTERIOR LUMBAR INTERBODY FUSION,GIL PROCEDURE;  Surgeon: Erline Levine, MD;  Location: [facility];  Service: Neurosurgery;  Laterality: N/A;   MENISCUS REPAIR Bilateral    R 07/2018, L 09/2018   POLYPECTOMY     TONSILLECTOMY     TOTAL LAPAROSCOPIC HYSTERECTOMY WITH SALPINGECTOMY Bilateral 01/12/2018   Procedure: TOTAL LAPAROSCOPIC HYSTERECTOMY WITH SALPINGECTOMY Possible BSO;  Surgeon: Megan Salon, MD;  Location: [facility];  Service: Gynecology;  Laterality: Bilateral;  possible BSO   UPPER GI ENDOSCOPY     reflux   WISDOM TOOTH EXTRACTION      Family History  Problem Relation Age of Onset   Diabetes Mother    Hypertension Mother    Cancer Father    Colon polyps Father        section of colon removed-unknown if CA related   Diabetes Father    Hypertension Sister    Colon polyps Brother 55   Colon polyps Paternal Grandfather  39   Colon cancer Paternal Grandfather 24   Breast cancer Cousin 30   Esophageal cancer Neg Hx    Rectal cancer Neg Hx    Stomach cancer Neg Hx     Social History   Tobacco Use   Smoking status: Never   Smokeless tobacco: Never  Vaping Use   Vaping Use: Never used  Substance Use Topics   Alcohol use: Yes    Comment: social wine   Drug use: No    Prior to Admission medications   Medication Sig Start Date End Date Taking? Authorizing Provider  ondansetron (ZOFRAN-ODT) 4 MG disintegrating tablet Take 1 tablet (4 mg total) by mouth every 8 (eight) hours as needed for nausea or vomiting. 12/22/21  Yes Davonna Belling, MD   oxyCODONE-acetaminophen (PERCOCET/ROXICET) 5-325 MG tablet Take 1-2 tablets by mouth every 8 (eight) hours as needed for severe pain. 12/22/21  Yes Davonna Belling, MD  COLLAGEN PO Take by mouth.    [provider]  dorzolamide (TRUSOPT) 2 % ophthalmic solution Place 1 drop into the left eye 3 (three) times daily. 10/01/21   Rankin, Clent Demark, MD  ELDERBERRY PO Take by mouth.    [provider]  eplerenone (INSPRA) 25 MG tablet Take 0.5 tablets by mouth daily. 05/14/19   [provider]  Fiber CHEW Chew 1 tablet by mouth daily as needed.    [provider]  KRILL OIL PO Take 1 capsule by mouth daily.    [provider]  Multiple Vitamin (MULTIVITAMIN) tablet Take 1 tablet by mouth daily.    [provider]  Multiple Vitamins-Minerals (EYE VITAMINS PO) Take 1 capsule by mouth 2 (two) times daily.    [provider]  NONFORMULARY OR COMPOUNDED ITEM Estradiol cream 0.02% in 51m prefilled syringes.  137mpv twice weekly.  #[id number]. 10/08/21   MilMegan SalonD  omeprazole (PRILOSEC) 20 MG capsule TAKE 1 CAPSULE BY MOUTH EVERY DAY 06/20/21   StaLadene ArtistD  OPZELURA 1.5 % CREA Apply 1 Dose topically daily. 05/30/21   [provider]  OVER THE COUNTER MEDICATION Take 1 Dose by mouth daily. OAT powder    [provider]  Probiotic Product (DIGESTIVE ADVANTAGE PO) Take 1 tablet by mouth daily.    [provider]  tretinoin (RETIN-A) 0.025 % cream tretinoin 0.025 % topical cream  APPLY CREAM TOPICALLY TO AFFECTED AREA AT BEDTIME    [provider]  Turmeric 500 MG CAPS Take by mouth daily.    [provider]  VITAMIN D, ERGOCALCIFEROL, PO Take 1,000 Units by mouth daily.    [provider]    Allergies Codeine and Sulfa antibiotics   REVIEW OF SYSTEMS  Negative except as noted here or in the History of Present Illness.   PHYSICAL EXAMINATION  Initial Vital Signs Blood  pressure (!) 152/102, pulse (!) 139, temperature 98.1 F (36.7 C), temperature source Oral, resp. rate (!) 24, last menstrual period 12/31/2017, SpO2 96 %.  Examination General: Well-developed, well-nourished female in no acute distress; appearance consistent with age of record HENT: normocephalic; atraumatic Eyes: Normal appearance Neck: supple Heart: regular rate and rhythm; tachycardia Lungs: clear to auscultation bilaterally Abdomen: soft; nondistended; suprapubic tenderness; bowel sounds present GU: Mild right CVA tenderness; bladder scan shows negligible retained urine Extremities: No deformity; full range of motion Neurologic: Awake, alert and oriented; motor function intact in all extremities and symmetric; no facial droop Skin: Warm and dry Psychiatric: Grimacing  RESULTS  Summary of this visit's results, reviewed and interpreted by myself:   EKG Interpretation  Date/Time:    Ventricular Rate:    PR Interval:    QRS Duration:   QT Interval:    QTC Calculation:   R Axis:     Text Interpretation:         Laboratory Studies: Results for orders placed or performed during the hospital encounter of 12/22/21 (from the past 24 hour(s))  CBC with Differential     Status: Abnormal   Collection Time: 12/22/21  6:06 AM  Result Value Ref Range   WBC 10.9 (H) 4.0 - 10.5 K/uL   RBC 5.07 3.87 - 5.11 MIL/uL   Hemoglobin 14.4 12.0 - 15.0 g/dL   HCT 44.7 36.0 - 46.0 %   MCV 88.2 80.0 - 100.0 fL   MCH 28.4 26.0 - 34.0 pg   MCHC 32.2 30.0 - 36.0 g/dL   RDW 12.3 11.5 - 15.5 %   Platelets 227 150 - 400 K/uL   nRBC 0.0 0.0 - 0.2 %   Neutrophils Relative % 89 %   Neutro Abs 9.7 (H) 1.7 - 7.7 K/uL   Lymphocytes Relative 4 %   Lymphs Abs 0.4 (L) 0.7 - 4.0 K/uL   Monocytes Relative 5 %   Monocytes Absolute 0.5 0.1 - 1.0 K/uL   Eosinophils Relative 0 %   Eosinophils Absolute 0.0 0.0 - 0.5 K/uL   Basophils Relative 0 %   Basophils Absolute 0.0 0.0 - 0.1 K/uL   Immature  Granulocytes 2 %   Abs Immature Granulocytes 0.18 (H) 0.00 - 0.07 K/uL  Basic metabolic panel     Status: Abnormal   Collection Time: 12/22/21  6:06 AM  Result Value Ref Range   Sodium 133 (L) 135 - 145 mmol/L   Potassium 3.4 (L) 3.5 - 5.1 mmol/L   Chloride 100 98 - 111 mmol/L   CO2 21 (L) 22 - 32 mmol/L   Glucose, Bld 173 (H) 70 - 99 mg/dL   BUN 17 6 - 20 mg/dL   Creatinine, Ser 0.77 0.44 - 1.00 mg/dL   Calcium 9.8 8.9 - 10.3 mg/dL   GFR, Estimated >60 >60 mL/min   Anion gap 12 5 - 15  Urinalysis, Routine w reflex microscopic Urine, Clean Catch     Status: Abnormal   Collection Time: 12/22/21  6:45 AM  Result Value Ref Range   Color, Urine YELLOW YELLOW   APPearance CLEAR CLEAR   Specific Gravity, Urine 1.017 1.005 - 1.030   pH 5.0 5.0 - 8.0   Glucose, UA 50 (A) NEGATIVE mg/dL   Hgb urine dipstick MODERATE (A) NEGATIVE   Bilirubin Urine NEGATIVE NEGATIVE   Ketones, ur 5 (A) NEGATIVE mg/dL   Protein, ur 30 (A) NEGATIVE mg/dL   Nitrite NEGATIVE NEGATIVE   Leukocytes,Ua SMALL (A) NEGATIVE   RBC / HPF 21-50 0 - 5 RBC/hpf   WBC, UA 0-5 0 - 5 WBC/hpf   Bacteria, UA NONE SEEN NONE SEEN   Squamous Epithelial / LPF 0-5 0 - 5   Mucus PRESENT     Imaging Studies: (12/22/2021)  CLINICAL DATA: Flank pain.  EXAM: CT ABDOMEN AND PELVIS WITHOUT CONTRAST  TECHNIQUE: Multidetector CT imaging of the abdomen and pelvis was performed following the standard protocol without IV contrast.  RADIATION DOSE REDUCTION: This exam was performed according to the departmental dose-optimization program which includes automated exposure control, adjustment of the mA and/or kV according to patient size  and/or use of iterative reconstruction technique.  COMPARISON: October 18, 2020  FINDINGS: Lower chest: No acute abnormality.  Hepatobiliary: Small cyst in the left hepatic lobe. Gallbladder is unremarkable. No biliary ductal dilation.  Pancreas: No pancreatic ductal dilation or evidence of  acute inflammation.  Spleen: No splenomegaly or focal splenic lesion.  Adrenals/Urinary Tract: Bilateral adrenal glands appear normal. No hydronephrosis. No renal, ureteral or bladder calculi identified. Pelvic phleboliths. Urinary bladder is nondistended limiting evaluation.  Stomach/Bowel: No enteric contrast was administered. Small hiatal hernia otherwise the stomach is unremarkable for degree of distension. No pathologic dilation of small or large bowel. Terminal ileum appears normal. Appendix is within normal limits. No evidence of acute bowel inflammation.  Vascular/Lymphatic: Normal caliber abdominal aorta. No pathologically enlarged abdominal or pelvic lymph nodes.  Reproductive: Status post hysterectomy. No adnexal masses.  Other: No significant abdominopelvic free fluid.  Musculoskeletal: L5-S1 posterior spinal fusion hardware with an by a disc spacer with unchanged 5 mm of L5 on S1 anterolisthesis.  IMPRESSION: 1. No acute abnormality of the abdomen or pelvis. Specifically, no evidence of obstructive uropathy. 2. Small hiatal hernia.   Electronically Signed By: Dahlia Bailiff M.D. On: 12/21/2021 14:46   ED COURSE and MDM  Nursing notes, initial and subsequent vitals signs, including pulse oximetry, reviewed and interpreted by myself.  Vitals:   12/22/21 0543 12/22/21 0551 12/22/21 0601 12/22/21 0737  BP: (!) 152/102   135/79  Pulse: (!) 139   100  Resp: (!) 24   16  Temp: 98.1 F (36.7 C)  (!) 100.4 F (38 C)   TempSrc: Oral  Oral   SpO2: 96%   98%  Weight:  59 kg    Height:  _0  (1.575 m)     Medications  sodium chloride 0.9 % bolus 1,000 mL (0 mLs Intravenous Stopped 12/22/21 0842)  ondansetron (ZOFRAN) injection 4 mg (4 mg Intravenous Given 12/22/21 0612)  metoCLOPramide (REGLAN) injection 10 mg (10 mg Intravenous Given 12/22/21 0613)  fentaNYL (SUBLIMAZE) injection 50 mcg (50 mcg Intravenous Given 12/22/21 0612)  sodium chloride 0.9 % bolus 1,000 mL (0  mLs Intravenous Stopped 12/22/21 0842)  acetaminophen (TYLENOL) tablet 1,000 mg (1,000 mg Oral Given 12/22/21 0714)   6:20 AM Oral temperature was rechecked and found to be 100.4.  6:58 AM Signed out to Dr. Alvino Chapel.  PROCEDURES  Procedures   ED DIAGNOSES     ICD-10-CM   1. Lower abdominal pain  R10.30     2. Hematuria, unspecified type  R31.9          Treena Cosman, Jenny Reichmann, MD 12/22/21 2228

## 2021-12-22 NOTE — ED Notes (Signed)
Bladder scan per MD request: 79m x3 attempts. Molphus MD advised. ENMiles ?

## 2021-12-23 LAB — URINE CULTURE: Culture: NO GROWTH

## 2021-12-27 ENCOUNTER — Other Ambulatory Visit: Payer: Self-pay

## 2021-12-27 ENCOUNTER — Ambulatory Visit (INDEPENDENT_AMBULATORY_CARE_PROVIDER_SITE_OTHER): Payer: Managed Care, Other (non HMO) | Admitting: Ophthalmology

## 2021-12-27 ENCOUNTER — Encounter (INDEPENDENT_AMBULATORY_CARE_PROVIDER_SITE_OTHER): Payer: Self-pay | Admitting: Ophthalmology

## 2021-12-27 DIAGNOSIS — H35712 Central serous chorioretinopathy, left eye: Secondary | ICD-10-CM | POA: Diagnosis not present

## 2021-12-27 DIAGNOSIS — H35722 Serous detachment of retinal pigment epithelium, left eye: Secondary | ICD-10-CM | POA: Diagnosis not present

## 2021-12-27 IMAGING — MG MM DIGITAL SCREENING BILAT W/ TOMO AND CAD
6 of 10 series · 6 of 30 positions shown · non-contrast
Comparison: Previous exam(s).

CLINICAL DATA: Screening.

EXAM:
DIGITAL SCREENING BILATERAL MAMMOGRAM WITH TOMOSYNTHESIS AND CAD
TECHNIQUE: Bilateral screening digital craniocaudal and mediolateral oblique
mammograms were obtained. Bilateral screening digital breast
tomosynthesis was performed. The images were evaluated with
computer-aided detection.

[L MLO synth-2D]
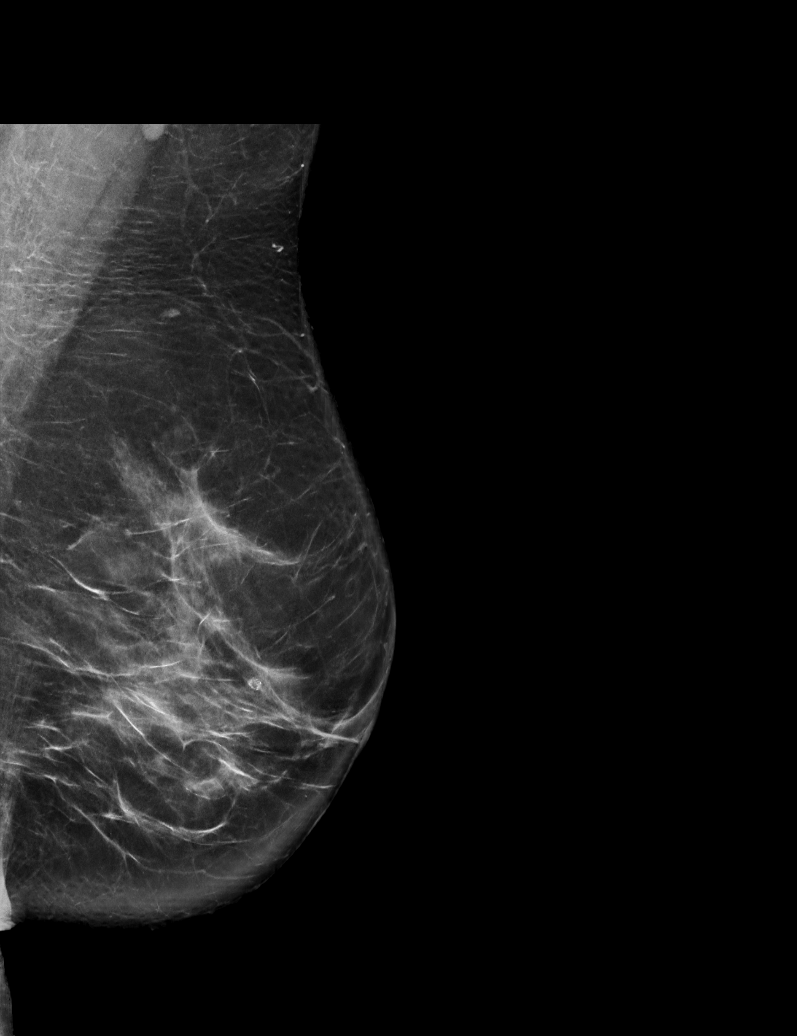

[R MLO synth-2D]
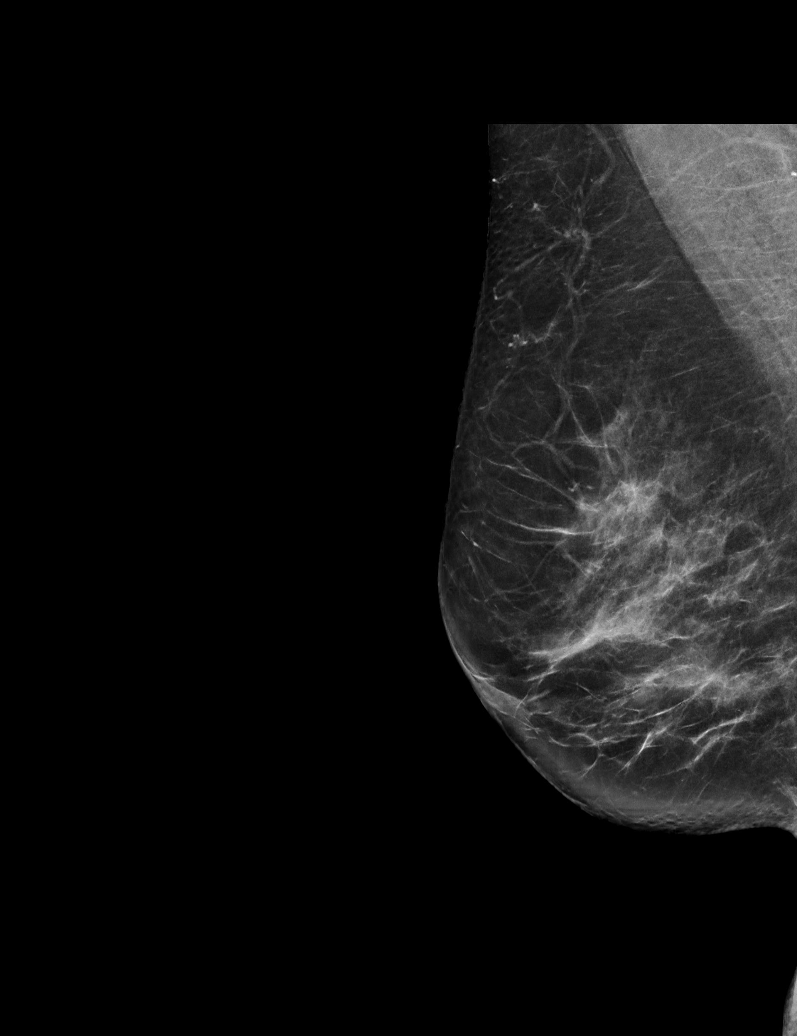

[L CC synth-2D]
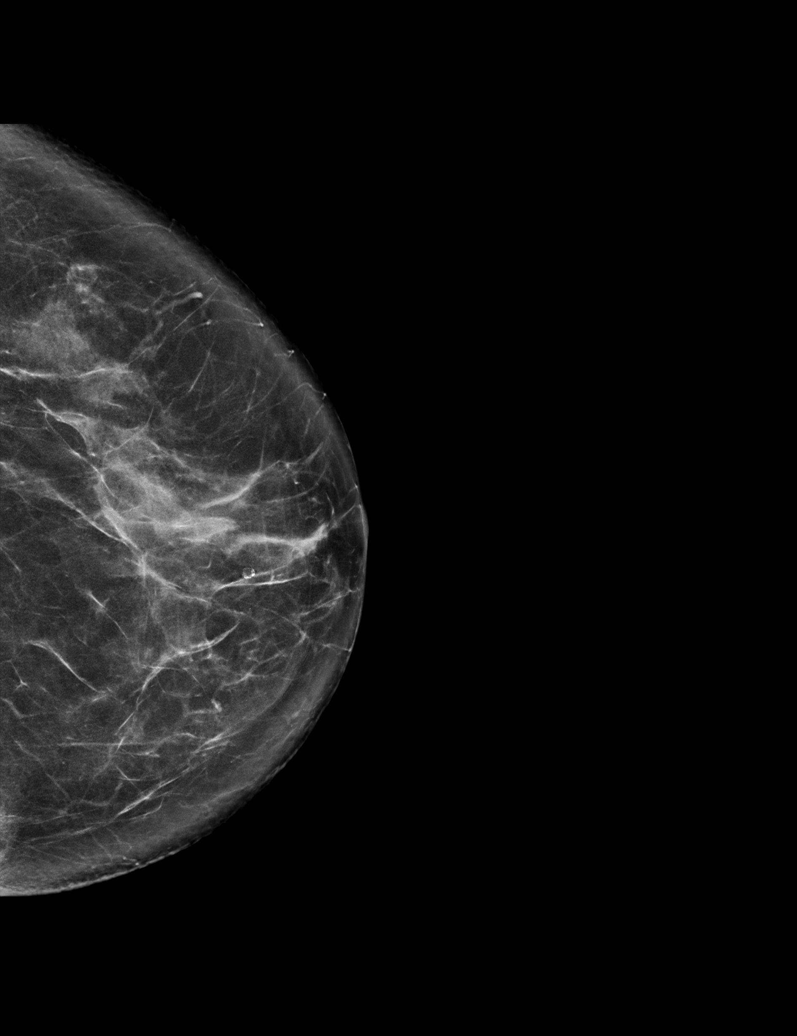

[R CC synth-2D (1 of 2)]
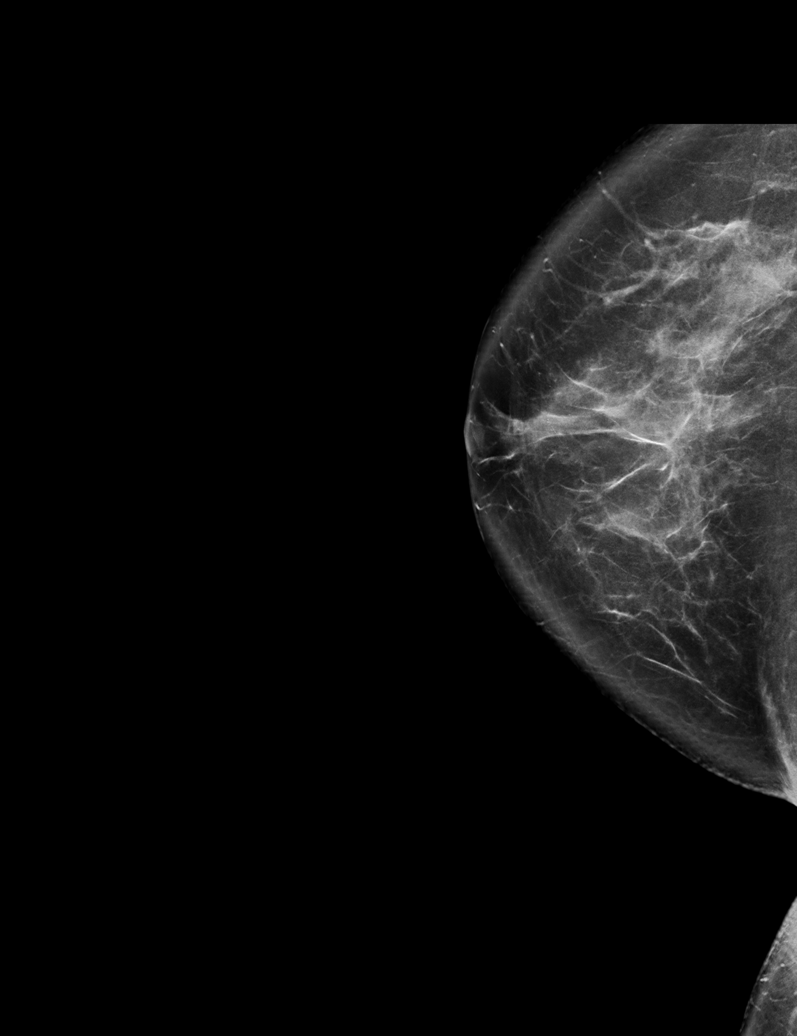

[R CC synth-2D (2 of 2)]
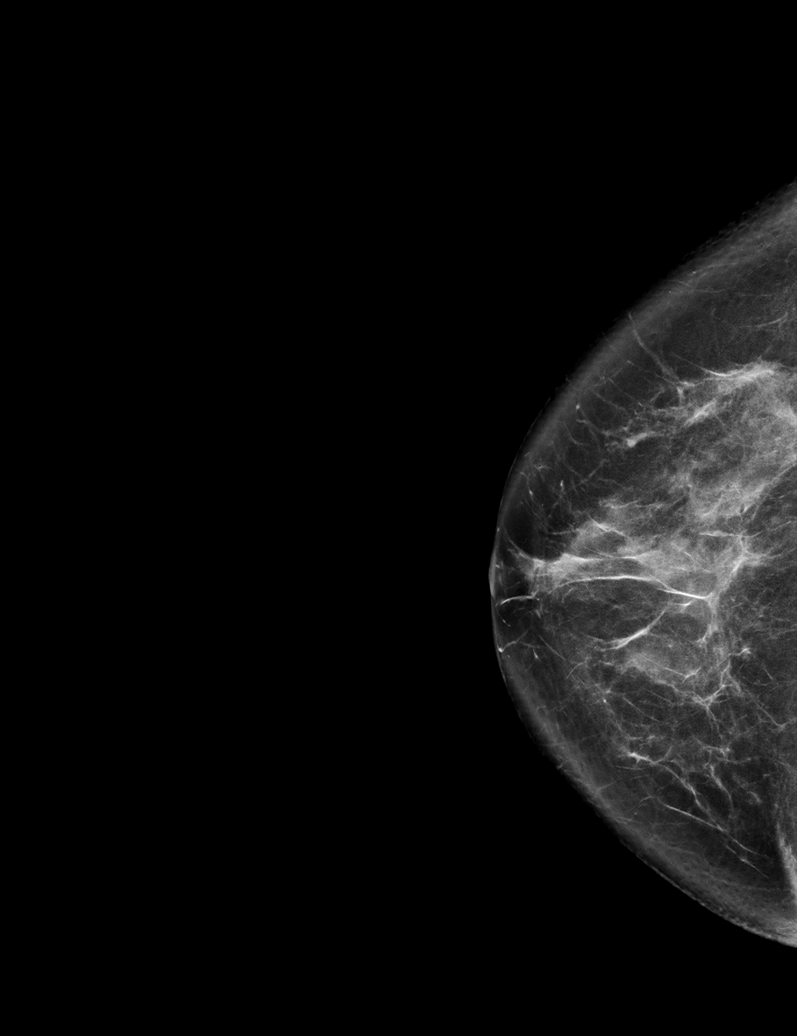

[L MLO tomo · tomo slice 39/78.0]
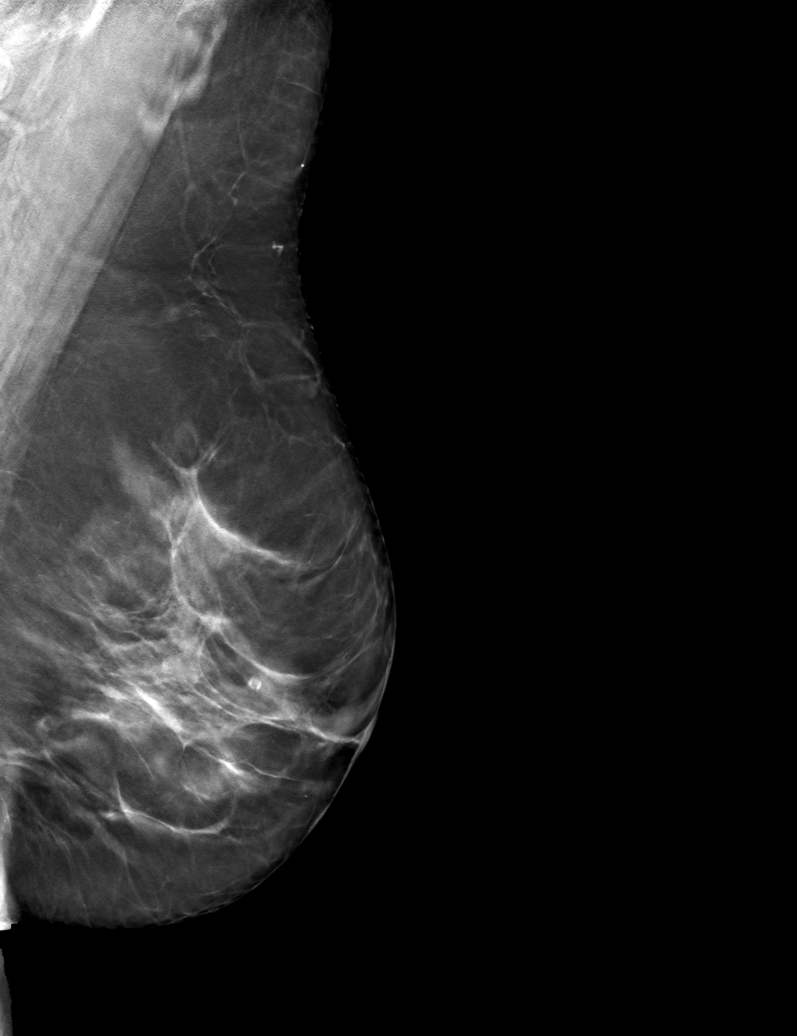

[6 of 30 positions shown; findings below may reference images not displayed]

ACR Breast Density Category c: The breast tissue is heterogeneously
dense, which may obscure small masses.
FINDINGS: There are no findings suspicious for malignancy.
IMPRESSION: No mammographic evidence of malignancy. A result letter of this
screening mammogram will be mailed directly to the patient.

RECOMMENDATION:
Screening mammogram in one year. (Code:Q3-W-BC3)

BI-RADS CATEGORY  1: Negative.

## 2021-12-27 NOTE — Assessment & Plan Note (Signed)
OS some worsening today on follow-up examination. We had planned possible fluorescein angiography to look for signs of persistent leakage in the left eye of CSME hours persistent.  His worsened today.  Per patient request we will not pursue fluorescein angiography today.  I will explained the patient that fluorescein angiography has no effect on urine other than just being expressed as a yellow-colored material.  With no impact on any inflammatory disorder or infection. ? ?Nonetheless no significant risk factors for delaying for some period of time but nonetheless the longer central serous remains in place the more likely long-term side effects will occur ?

## 2021-12-27 NOTE — Progress Notes (Signed)
12/27/2021     CHIEF COMPLAINT Patient presents for  Chief Complaint  Patient presents with   Retina Follow Up      HISTORY OF PRESENT ILLNESS: Shelly Gilbert is a 61 y.o. female who presents to the clinic today for:   HPI     Retina Follow Up           Diagnosis: Other (Central serous chorioretinopathy )   Laterality: left eye   Onset: 12 weeks ago   Course: stable         Comments   12 weeks, 3 days fu OU oct fp, Possible [place] FFA (r/s from 12/03/21). Patient states vision is stable and unchanged since last visit. Denies any new floaters or FOL. Pt states she ended up in the ER for a UTI and has been sick on Saturday. 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 Pt declines FFA for today due to her recent ER visit, and currently being on an antibiotic.  Due to her being on antibiotics and recent UTI.  Patient states she doesn't feel well enough to do the FFA Procedure.      Last edited by Hurman Horn, MD on 12/27/2021 11:37 AM.      Referring physician: Shirline Frees, MD [place],  [place] [id number]  HISTORICAL INFORMATION:   Selected notes from the MEDICAL RECORD NUMBER       CURRENT MEDICATIONS: Current Outpatient Medications (Ophthalmic Drugs)  Medication Sig   dorzolamide (TRUSOPT) 2 % ophthalmic solution Place 1 drop into the left eye 3 (three) times daily.   No current facility-administered medications for this visit. (Ophthalmic Drugs)   Current Outpatient Medications (Other)  Medication Sig   COLLAGEN PO Take by mouth.   ELDERBERRY PO Take by mouth.   eplerenone (INSPRA) 25 MG tablet Take 0.5 tablets by mouth daily.   Fiber CHEW Chew 1 tablet by mouth daily as needed.   KRILL OIL PO Take 1 capsule by mouth daily.   Multiple  Vitamin (MULTIVITAMIN) tablet Take 1 tablet by mouth daily.   Multiple Vitamins-Minerals (EYE VITAMINS PO) Take 1 capsule by mouth 2 (two) times daily.   NONFORMULARY OR COMPOUNDED ITEM Estradiol cream 0.02% in 55m prefilled syringes.  158mpv twice weekly.  #[id number].   omeprazole (PRILOSEC) 20 MG capsule TAKE 1 CAPSULE BY MOUTH EVERY DAY   ondansetron (ZOFRAN-ODT) 4 MG disintegrating tablet Take 1 tablet (4 mg total) by mouth every 8 (eight) hours as needed for nausea or vomiting.   OPZELURA 1.5 % CREA Apply 1 Dose topically daily.   OVER THE COUNTER MEDICATION Take 1 Dose by mouth daily. OAT powder   oxyCODONE-acetaminophen (PERCOCET/ROXICET) 5-325 MG tablet Take 1-2 tablets by mouth every 8 (eight) hours as needed for severe pain.   Probiotic Product (DIGESTIVE ADVANTAGE PO) Take 1 tablet by mouth daily.   tretinoin (RETIN-A) 0.025 % cream tretinoin 0.025 % topical cream  APPLY CREAM TOPICALLY TO AFFECTED AREA AT BEDTIME   Turmeric 500 MG CAPS Take by mouth daily.   VITAMIN D, ERGOCALCIFEROL, PO Take 1,000 Units by mouth daily.   No current facility-administered medications for this visit. (Other)      REVIEW OF SYSTEMS:  ROS   Negative for: Constitutional, Gastrointestinal, Neurological, Skin, Genitourinary, Musculoskeletal, HENT, Endocrine, Cardiovascular, Eyes, Respiratory, Psychiatric, Allergic/Imm, Heme/Lymph Last edited by Hurman Horn, MD on 12/27/2021 11:36 AM.       ALLERGIES Allergies  Allergen Reactions   Codeine Other (See Comments)    Severe nightmares   Sulfa Antibiotics Hives    PAST MEDICAL HISTORY Past Medical History:  Diagnosis Date   Adenomyosis    Anemia    Anxiety    situational per pt.   Arthritis    RIGHT knee/ bilateral hands/RIGHT foot   Bursitis of hip    left   Chronic kidney disease    hx of kidney stones   Floaters, bilateral    GERD (gastroesophageal reflux disease)    on meds   Hemorrhoids    Hiatal hernia    Hyperlipidemia     borderline, controlled with diet per pt.   Hypertension    IBS (irritable bowel syndrome)    Morphea    Seasonal allergies    Seasonal allergies    SVD (spontaneous vaginal delivery)    x 2   Thyroid disease    goiter and cyst on thyroid-followed by [place] yearly   Tubular adenoma of colon 04/2011   White coat syndrome without diagnosis of hypertension    while in office/sx-anxiety increases as well   Past Surgical History:  Procedure Laterality Date   COLONOSCOPY  2017   MS-MAC-suprep(exc)-hems-(hx of TA)-5 yr recall   CYSTOSCOPY N/A 01/12/2018   Procedure: CYSTOSCOPY;  Surgeon: Megan Salon, MD;  Location: [facility];  Service: Gynecology;  Laterality: N/A;   EXTRACORPOREAL SHOCK WAVE LITHOTRIPSY Right 10/09/2020   Procedure: EXTRACORPOREAL SHOCK WAVE LITHOTRIPSY (ESWL);  Surgeon: Janith Lima, MD;  Location: [facility];  Service: Urology;  Laterality: Right;   MAXIMUM ACCESS (MAS)POSTERIOR LUMBAR INTERBODY FUSION (PLIF) 1 LEVEL N/A 02/01/2014   Procedure: LUMBAR FIVE-SACFAL ONE POSTERIOR LUMBAR INTERBODY FUSION,GIL PROCEDURE;  Surgeon: Erline Levine, MD;  Location: [facility];  Service: Neurosurgery;  Laterality: N/A;   MENISCUS REPAIR Bilateral    R 07/2018, L 09/2018   POLYPECTOMY     TONSILLECTOMY     TOTAL LAPAROSCOPIC HYSTERECTOMY WITH SALPINGECTOMY Bilateral 01/12/2018   Procedure: TOTAL LAPAROSCOPIC HYSTERECTOMY WITH SALPINGECTOMY Possible BSO;  Surgeon: Megan Salon, MD;  Location: [facility];  Service: Gynecology;  Laterality: Bilateral;  possible BSO   UPPER GI ENDOSCOPY     reflux   WISDOM TOOTH EXTRACTION      FAMILY HISTORY Family History  Problem Relation Age of Onset   Diabetes Mother    Hypertension Mother    Cancer Father    Colon polyps Father        section of colon removed-unknown if CA related   Diabetes Father    Hypertension Sister    Colon polyps Brother 72   Colon polyps Paternal Grandfather 72   Colon cancer Paternal  Grandfather 10   Breast cancer Cousin 12   Esophageal cancer Neg Hx    Rectal cancer Neg Hx    Stomach cancer Neg Hx     SOCIAL HISTORY Social History   Tobacco Use   Smoking status: Never   Smokeless tobacco: Never  Vaping Use   Vaping Use: Never used  Substance Use Topics   Alcohol use: Yes    Comment: social wine   Drug use: No         OPHTHALMIC EXAM:  Base Eye Exam  Visual Acuity (ETDRS)       Right Left   Dist cc 20/25 20/50 -1   Dist ph cc  20/25    Correction: Glasses         Tonometry (Tonopen, 11:10 AM)       Right Left   Pressure 18 15         Pupils       Pupils Dark Light APD   Right PERRL 4 3 None   Left PERRL 4 3 None         Extraocular Movement       Right Left    Full Full         Neuro/Psych     Oriented x3: Yes   Mood/Affect: Normal         Dilation     Both eyes: 1.0% Mydriacyl, 2.5% Phenylephrine @ 11:10 AM           Slit Lamp and Fundus Exam     External Exam       Right Left   External Normal Normal         Slit Lamp Exam       Right Left   Lids/Lashes Normal Normal   Conjunctiva/Sclera White and quiet White and quiet   Cornea Clear Clear   Anterior Chamber Deep and quiet Deep and quiet   Iris Round and reactive Round and reactive   Lens 1+ Nuclear sclerosis 1+ Nuclear sclerosis   Anterior Vitreous Normal Normal         Fundus Exam       Right Left   Posterior Vitreous Normal Normal   Disc Normal Normal   C/D Ratio 0.45 0.45   Macula Normal Retinal pigment epithelial mottling   Vessels Normal Normal   Periphery Normal Normal            IMAGING AND PROCEDURES  Imaging and Procedures for 12/27/21  OCT, Retina - OU - Both Eyes       Right Eye Quality was good. Scan locations included subfoveal. Central Foveal Thickness: 363. Findings include normal observations, no SRF.   Left Eye Quality was good. Scan locations included subfoveal. Central Foveal Thickness: 252.  Progression has improved. Findings include no SRF, subretinal hyper-reflective material.   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   OS, recent central serous retinopathy with subretinal fluid at 80 m is in fact today worsened to 252 m subfoveal involvement.  I explained to the patient that the UTI itself has probably no impact on this finding however I have seen Cipro inducing secondary worsening of central serous retinopathy.  She was on this medication for her UTI over the last several days but discontinued it yesterday.  I have explained to her that I cannot prove that Cipro made this worse but I have seen severe cases of it in the past     Color Fundus Photography Optos - OU - Both Eyes       Right Eye Progression has been stable.   Left Eye Progression has been stable. Macula : retinal pigment epithelium abnormalities.   Notes No interval change             ASSESSMENT/PLAN:  Serous detachment of retinal pigment epithelium of left eye OS some worsening today on follow-up examination. We had planned possible fluorescein angiography to look for signs of persistent leakage in the  left eye of CSME hours persistent.  His worsened today.  Per patient request we will not pursue fluorescein angiography today.  I will explained the patient that fluorescein angiography has no effect on urine other than just being expressed as a yellow-colored material.  With no impact on any inflammatory disorder or infection.  Nonetheless no significant risk factors for delaying for some period of time but nonetheless the longer central serous remains in place the more likely long-term side effects will occur     ICD-10-CM   1. Central serous chorioretinopathy of left eye  H35.712 OCT, Retina - OU - Both Eyes    Color Fundus Photography Optos - OU - Both Eyes    2. Serous  detachment of retinal pigment epithelium of left eye  H35.722       1.  OU, history of serous retinopathy, OD now stable, OS with worsening of recent development of subfoveal disease.  2.  We will offer patient follow-up in 3 weeks or as needed her level of comfort and ability to proceed with fluorescein angiography to look for the site of leakage in the left eye  3.  Ophthalmic Meds Ordered this visit:  No orders of the defined types were placed in this encounter.      Return in about 3 weeks (around 01/17/2022) for DILATE OU, COLOR FP, OCT, OPTOS FFA L/R.  There are no Patient Instructions on file for this visit.   Explained the diagnoses, plan, and follow up with the patient and they expressed understanding.  Patient expressed understanding of the importance of proper follow up care.   Clent Demark Jeffery Gammell M.D. Diseases & Surgery of the Retina and Vitreous [facility] 12/27/21     Abbreviations: M myopia (nearsighted); A astigmatism; H hyperopia (farsighted); P presbyopia; Mrx spectacle prescription;  CTL contact lenses; OD right eye; OS left eye; OU both eyes  XT exotropia; ET esotropia; PEK punctate epithelial keratitis; PEE punctate epithelial erosions; DES dry eye syndrome; MGD meibomian gland dysfunction; ATs artificial tears; PFAT's preservative free artificial tears; [place] nuclear sclerotic cataract; PSC posterior subcapsular cataract; ERM epi-retinal membrane; PVD posterior vitreous detachment; RD retinal detachment; DM diabetes mellitus; DR diabetic retinopathy; NPDR non-proliferative diabetic retinopathy; PDR proliferative diabetic retinopathy; CSME clinically significant macular edema; DME diabetic macular edema; dbh dot blot hemorrhages; CWS cotton wool spot; POAG primary open angle glaucoma; C/D cup-to-disc ratio; HVF humphrey visual field; GVF goldmann visual field; OCT optical coherence tomography; IOP intraocular pressure; BRVO Branch retinal vein occlusion;  CRVO central retinal vein occlusion; CRAO central retinal artery occlusion; BRAO branch retinal artery occlusion; RT retinal tear; SB scleral buckle; PPV pars plana vitrectomy; VH Vitreous hemorrhage; PRP panretinal laser photocoagulation; IVK intravitreal kenalog; VMT vitreomacular traction; MH Macular hole;  NVD neovascularization of the disc; NVE neovascularization elsewhere; AREDS age related eye disease study; ARMD age related macular degeneration; POAG primary open angle glaucoma; EBMD epithelial/anterior basement membrane dystrophy; ACIOL anterior chamber intraocular lens; IOL intraocular lens; PCIOL posterior chamber intraocular lens; Phaco/IOL phacoemulsification with intraocular lens placement; [place] photorefractive keratectomy; LASIK laser assisted in situ keratomileusis; HTN hypertension; DM diabetes mellitus; COPD chronic obstructive pulmonary disease

## 2022-01-15 ENCOUNTER — Encounter (INDEPENDENT_AMBULATORY_CARE_PROVIDER_SITE_OTHER): Payer: Self-pay | Admitting: Ophthalmology

## 2022-01-15 ENCOUNTER — Other Ambulatory Visit: Payer: Self-pay

## 2022-01-15 ENCOUNTER — Ambulatory Visit (INDEPENDENT_AMBULATORY_CARE_PROVIDER_SITE_OTHER): Payer: Managed Care, Other (non HMO) | Admitting: Ophthalmology

## 2022-01-15 DIAGNOSIS — H35712 Central serous chorioretinopathy, left eye: Secondary | ICD-10-CM | POA: Diagnosis not present

## 2022-01-15 DIAGNOSIS — H35722 Serous detachment of retinal pigment epithelium, left eye: Secondary | ICD-10-CM

## 2022-01-15 NOTE — Assessment & Plan Note (Signed)
Patient has resumed eplerenone over the last 3 weeks. ? ?Central serous also coincidentally did resolved off Cipro ?

## 2022-01-15 NOTE — Assessment & Plan Note (Signed)
Much less subretinal fluid today continues on eplerenone, discontinuation of Cipro associated but unknown if causative ?

## 2022-01-15 NOTE — Progress Notes (Signed)
? ? ?01/15/2022 ? ?  ? ?CHIEF COMPLAINT ?Patient presents for  ?Chief Complaint  ?Patient presents with  ? Retina Follow Up  ? ? ? ? ?HISTORY OF PRESENT ILLNESS: ?Shelly Gilbert is a 61 y.o. female who presents to the clinic today for:  ? ?HPI   ? ? Retina Follow Up   ? ?      ? Diagnosis: Other (Central serous chorioretinopathy)  ? Laterality: left eye  ? Onset: 3 weeks ago  ? ?  ?  ? ? Comments   ?3 weeks Dilate OU, FP, OCT, FFA L/R. ?Pt states she does not notice any changes in her vision since last visit. ?Pt is using dorzolamide BID OS. ?Pt states "I used to be on an 6m aspirin because my mother used to have strokes and died from that, I was off it when I had a hysterectomy and it was mentioned to me that I should go back on it, a couple weeks back when I was in the ER. I would like to know if I could start that again because I read where it helps with central serous retinopathy, but if it interferes with any other medications." ?Pt reports she is taking 0.5 tablet of Eplerenone started 1-2 days after last appointment, about 3 weeks ago. I had Dr. HMarzetta Boardoffice refill it. ? ?  ?  ?Last edited by KLaurin Coderon 01/15/2022 11:11 AM.  ?  ? ? ?Referring physician: ?HShirline Frees MD ?[address] ?[place]  [place] [id number]? ?HISTORICAL INFORMATION:  ? ?Selected notes from the [facility]?  ?   ? ?CURRENT MEDICATIONS: ?Current Outpatient Medications (Ophthalmic Drugs)  ?Medication Sig  ? dorzolamide (TRUSOPT) 2 % ophthalmic solution Place 1 drop into the left eye 3 (three) times daily.  ? ?No current facility-administered medications for this visit. (Ophthalmic Drugs)  ? ?Current Outpatient Medications (Other)  ?Medication Sig  ? COLLAGEN PO Take by mouth.  ? ELDERBERRY PO Take by mouth.  ? eplerenone (INSPRA) 25 MG tablet Take 0.5 tablets by mouth daily.  ? Fiber CHEW Chew 1 tablet by mouth daily as needed.  ? KRILL OIL PO Take 1 capsule by mouth daily.  ? Multiple Vitamin  (MULTIVITAMIN) tablet Take 1 tablet by mouth daily.  ? Multiple Vitamins-Minerals (EYE VITAMINS PO) Take 1 capsule by mouth 2 (two) times daily.  ? NONFORMULARY OR COMPOUNDED ITEM Estradiol cream 0.02% in 143mprefilled syringes.  62m68mv twice weekly.  #[id number].  ? omeprazole (PRILOSEC) 20 MG capsule TAKE 1 CAPSULE BY MOUTH EVERY DAY  ? ondansetron (ZOFRAN-ODT) 4 MG disintegrating tablet Take 1 tablet (4 mg total) by mouth every 8 (eight) hours as needed for nausea or vomiting.  ? OPZELURA 1.5 % CREA Apply 1 Dose topically daily.  ? OVER THE COUNTER MEDICATION Take 1 Dose by mouth daily. OAT powder  ? oxyCODONE-acetaminophen (PERCOCET/ROXICET) 5-325 MG tablet Take 1-2 tablets by mouth every 8 (eight) hours as needed for severe pain.  ? Probiotic Product (DIGESTIVE ADVANTAGE PO) Take 1 tablet by mouth daily.  ? tretinoin (RETIN-A) 0.025 % cream tretinoin 0.025 % topical cream ? APPLY CREAM TOPICALLY TO AFFECTED AREA AT BEDTIME  ? Turmeric 500 MG CAPS Take by mouth daily.  ? VITAMIN D, ERGOCALCIFEROL, PO Take 1,000 Units by mouth daily.  ? ?No current facility-administered medications for this visit. (Other)  ? ? ? ? ?REVIEW OF SYSTEMS: ? ? ? ?ALLERGIES ?Allergies  ?Allergen  Reactions  ? Codeine Other (See Comments)  ?  Severe nightmares  ? Sulfa Antibiotics Hives  ? ? ?PAST MEDICAL HISTORY ?Past Medical History:  ?Diagnosis Date  ? Adenomyosis   ? Anemia   ? Anxiety   ? situational per pt.  ? Arthritis   ? RIGHT knee/ bilateral hands/RIGHT foot  ? Bursitis of hip   ? left  ? Chronic kidney disease   ? hx of kidney stones  ? Floaters, bilateral   ? GERD (gastroesophageal reflux disease)   ? on meds  ? Hemorrhoids   ? Hiatal hernia   ? Hyperlipidemia   ? borderline, controlled with diet per pt.  ? Hypertension   ? IBS (irritable bowel syndrome)   ? Morphea   ? Seasonal allergies   ? Seasonal allergies   ? SVD (spontaneous vaginal delivery)   ? x 2  ? Thyroid disease   ? goiter and cyst on thyroid-followed by [place]  yearly  ? Tubular adenoma of colon 04/2011  ? White coat syndrome without diagnosis of hypertension   ? while in office/sx-anxiety increases as well  ? ?Past Surgical History:  ?Procedure Laterality Date  ? COLONOSCOPY  2017  ? MS-MAC-suprep(exc)-hems-(hx of TA)-5 yr recall  ? CYSTOSCOPY N/A 01/12/2018  ? Procedure: CYSTOSCOPY;  Surgeon: Megan Salon, MD;  Location: [facility];  Service: Gynecology;  Laterality: N/A;  ? EXTRACORPOREAL SHOCK WAVE LITHOTRIPSY Right 10/09/2020  ? Procedure: EXTRACORPOREAL SHOCK WAVE LITHOTRIPSY (ESWL);  Surgeon: Janith Lima, MD;  Location: [facility];  Service: Urology;  Laterality: Right;  ? MAXIMUM ACCESS (MAS)POSTERIOR LUMBAR INTERBODY FUSION (PLIF) 1 LEVEL N/A 02/01/2014  ? Procedure: LUMBAR FIVE-SACFAL ONE POSTERIOR LUMBAR INTERBODY FUSION,GIL PROCEDURE;  Surgeon: Erline Levine, MD;  Location: [facility];  Service: Neurosurgery;  Laterality: N/A;  ? MENISCUS REPAIR Bilateral   ? R 07/2018, L 09/2018  ? POLYPECTOMY    ? TONSILLECTOMY    ? TOTAL LAPAROSCOPIC HYSTERECTOMY WITH SALPINGECTOMY Bilateral 01/12/2018  ? Procedure: TOTAL LAPAROSCOPIC HYSTERECTOMY WITH SALPINGECTOMY Possible BSO;  Surgeon: Megan Salon, MD;  Location: [facility];  Service: Gynecology;  Laterality: Bilateral;  possible BSO  ? UPPER GI ENDOSCOPY    ? reflux  ? WISDOM TOOTH EXTRACTION    ? ? ?FAMILY HISTORY ?Family History  ?Problem Relation Age of Onset  ? Diabetes Mother   ? Hypertension Mother   ? Cancer Father   ? Colon polyps Father   ?     section of colon removed-unknown if CA related  ? Diabetes Father   ? Hypertension Sister   ? Colon polyps Brother 42  ? Colon polyps Paternal Grandfather 72  ? Colon cancer Paternal Grandfather 48  ? Breast cancer Cousin 19  ? Esophageal cancer Neg Hx   ? Rectal cancer Neg Hx   ? Stomach cancer Neg Hx   ? ? ?SOCIAL HISTORY ?Social History  ? ?Tobacco Use  ? Smoking status: Never  ? Smokeless tobacco: Never  ?Vaping Use  ? Vaping Use: Never used   ?Substance Use Topics  ? Alcohol use: Yes  ?  Comment: social wine  ? Drug use: No  ? ?  ? ?  ? ?OPHTHALMIC EXAM: ? ?Base Eye Exam   ? ? Visual Acuity (ETDRS)   ? ?   Right Left  ? Dist cc 20/20 -1 20/30  ? ? Correction: Glasses  ? ?  ?  ? ? Tonometry (Tonopen, 11:13 AM)   ? ?  Right Left  ? Pressure 15 15  ? ?  ?  ? ? Pupils   ? ?   Pupils Dark Light APD  ? Right PERRL 4 3 None  ? Left PERRL 4 3 None  ? ?  ?  ? ? Visual Fields (Counting fingers)   ? ?   Left Right  ?  Full Full  ? ?  ?  ? ? Extraocular Movement   ? ?   Right Left  ?  Full Full  ? ?  ?  ? ? Neuro/Psych   ? ? Oriented x3: Yes  ? Mood/Affect: Normal  ? ?  ?  ? ? Dilation   ? ? Both eyes: 1.0% Mydriacyl, 2.5% Phenylephrine @ 11:13 AM  ? ?  ?  ? ?  ? ?Slit Lamp and Fundus Exam   ? ? External Exam   ? ?   Right Left  ? External Normal Normal  ? ?  ?  ? ? Slit Lamp Exam   ? ?   Right Left  ? Lids/Lashes Normal Normal  ? Conjunctiva/Sclera White and quiet White and quiet  ? Cornea Clear Clear  ? Anterior Chamber Deep and quiet Deep and quiet  ? Iris Round and reactive Round and reactive  ? Lens 1+ Nuclear sclerosis 1+ Nuclear sclerosis  ? Anterior Vitreous Normal Normal  ? ?  ?  ? ? Fundus Exam   ? ?   Right Left  ? Posterior Vitreous Normal Normal  ? Disc Normal Normal  ? C/D Ratio 0.45 0.45  ? Macula Normal Retinal pigment epithelial mottling, no subretinal fluid  ? Vessels Normal Normal  ? Periphery Normal Normal  ? ?  ?  ? ?  ? ? ?IMAGING AND PROCEDURES  ?Imaging and Procedures for 01/15/22 ? ?OCT, Retina - OU - Both Eyes   ? ?   ?Right Eye ?Quality was good. Scan locations included subfoveal. Central Foveal Thickness: 259. Findings include normal observations, no SRF.  ? ?Left Eye ?Quality was good. Scan locations included subfoveal. Central Foveal Thickness: 211. Progression has improved. Findings include no SRF, subretinal hyper-reflective material.  ? ?Notes ?OD, History of central serous retinopathy with large chronic central serous retinal  detachment, which underwent focal laser treatment November 2020. ? ? ?OS, recent central serous retinopathy with subretinal fluid at 236 ?m is in fact today worsened to 252 ?m subfoveal involvement. ? ?I explained to the pati

## 2022-02-11 ENCOUNTER — Other Ambulatory Visit: Payer: Self-pay | Admitting: Family Medicine

## 2022-02-11 DIAGNOSIS — I779 Disorder of arteries and arterioles, unspecified: Secondary | ICD-10-CM

## 2022-02-11 DIAGNOSIS — E042 Nontoxic multinodular goiter: Secondary | ICD-10-CM

## 2022-02-26 ENCOUNTER — Inpatient Hospital Stay: Admission: RE | Admit: 2022-02-26 | Payer: Managed Care, Other (non HMO) | Source: Ambulatory Visit

## 2022-03-26 ENCOUNTER — Other Ambulatory Visit: Payer: Managed Care, Other (non HMO)

## 2022-04-08 ENCOUNTER — Ambulatory Visit
Admission: RE | Admit: 2022-04-08 | Discharge: 2022-04-08 | Disposition: A | Discharge status: Home / Self Care | Payer: Managed Care, Other (non HMO) | Source: Ambulatory Visit | Attending: Family Medicine | Admitting: Family Medicine

## 2022-04-08 DIAGNOSIS — I779 Disorder of arteries and arterioles, unspecified: Secondary | ICD-10-CM

## 2022-04-08 DIAGNOSIS — E042 Nontoxic multinodular goiter: Secondary | ICD-10-CM

## 2022-04-17 ENCOUNTER — Ambulatory Visit (INDEPENDENT_AMBULATORY_CARE_PROVIDER_SITE_OTHER): Payer: Managed Care, Other (non HMO) | Admitting: Ophthalmology

## 2022-04-17 ENCOUNTER — Encounter (INDEPENDENT_AMBULATORY_CARE_PROVIDER_SITE_OTHER): Payer: Self-pay | Admitting: Ophthalmology

## 2022-04-17 ENCOUNTER — Encounter (INDEPENDENT_AMBULATORY_CARE_PROVIDER_SITE_OTHER): Payer: Managed Care, Other (non HMO) | Admitting: Ophthalmology

## 2022-04-17 DIAGNOSIS — H35712 Central serous chorioretinopathy, left eye: Secondary | ICD-10-CM

## 2022-04-17 MED ORDER — DORZOLAMIDE HCL 2 % OP SOLN: 1.0000 [drp] | Freq: Three times a day (TID) | OPHTHALMIC | 1 refills | Status: DC

## 2022-04-17 NOTE — Progress Notes (Signed)
04/17/2022     CHIEF COMPLAINT Patient presents for  Chief Complaint  Patient presents with   Retina Follow Up      HISTORY OF PRESENT ILLNESS: Shelly Gilbert is a 61 y.o. female who presents to the clinic today for:   HPI     Retina Follow Up           Diagnosis: Central Serous Chorioretinopathy   Laterality: left eye   Onset: 3 months ago         Comments   3 mos FU Color FP, OCT, dilate OS. Patient states vision is stable and unchanged since last visit. Denies any new floaters or FOL. Patient reports she is using Dorzolamide BID OS.      Last edited by Laurin Coder on 04/17/2022 10:35 AM.      Referring physician: Monna Fam, MD [place],  [place] [id number]  HISTORICAL INFORMATION:   Selected notes from the MEDICAL RECORD NUMBER       CURRENT MEDICATIONS: Current Outpatient Medications (Ophthalmic Drugs)  Medication Sig   dorzolamide (TRUSOPT) 2 % ophthalmic solution Place 1 drop into the left eye 3 (three) times daily.   No current facility-administered medications for this visit. (Ophthalmic Drugs)   Current Outpatient Medications (Other)  Medication Sig   COLLAGEN PO Take by mouth.   ELDERBERRY PO Take by mouth.   eplerenone (INSPRA) 25 MG tablet Take 0.5 tablets by mouth daily.   Fiber CHEW Chew 1 tablet by mouth daily as needed.   KRILL OIL PO Take 1 capsule by mouth daily.   Multiple Vitamin (MULTIVITAMIN) tablet Take 1 tablet by mouth daily.   Multiple Vitamins-Minerals (EYE VITAMINS PO) Take 1 capsule by mouth 2 (two) times daily.   NONFORMULARY OR COMPOUNDED ITEM Estradiol cream 0.02% in 56m prefilled syringes.  177mpv twice weekly.  #[id number].   omeprazole (PRILOSEC) 20 MG capsule TAKE 1 CAPSULE BY MOUTH EVERY DAY   ondansetron (ZOFRAN-ODT) 4 MG disintegrating tablet Take 1 tablet (4 mg total) by mouth every 8 (eight) hours as needed for nausea or vomiting.   OPZELURA 1.5 % CREA Apply 1 Dose topically  daily.   OVER THE COUNTER MEDICATION Take 1 Dose by mouth daily. OAT powder   oxyCODONE-acetaminophen (PERCOCET/ROXICET) 5-325 MG tablet Take 1-2 tablets by mouth every 8 (eight) hours as needed for severe pain.   Probiotic Product (DIGESTIVE ADVANTAGE PO) Take 1 tablet by mouth daily.   tretinoin (RETIN-A) 0.025 % cream tretinoin 0.025 % topical cream  APPLY CREAM TOPICALLY TO AFFECTED AREA AT BEDTIME   Turmeric 500 MG CAPS Take by mouth daily.   VITAMIN D, ERGOCALCIFEROL, PO Take 1,000 Units by mouth daily.   No current facility-administered medications for this visit. (Other)      REVIEW OF SYSTEMS: ROS   Negative for: Constitutional, Gastrointestinal, Neurological, Skin, Genitourinary, Musculoskeletal, HENT, Endocrine, Cardiovascular, Eyes, Respiratory, Psychiatric, Allergic/Imm, Heme/Lymph Last edited by RanHurman HornD on 04/17/2022 11:45 AM.       ALLERGIES Allergies  Allergen Reactions   Codeine Other (See Comments)    Severe nightmares   Sulfa Antibiotics Hives    PAST MEDICAL HISTORY Past Medical History:  Diagnosis Date   Adenomyosis    Anemia    Anxiety    situational per pt.   Arthritis    RIGHT knee/ bilateral hands/RIGHT foot   Bursitis of hip    left   Chronic kidney disease  hx of kidney stones   Floaters, bilateral    GERD (gastroesophageal reflux disease)    on meds   Hemorrhoids    Hiatal hernia    Hyperlipidemia    borderline, controlled with diet per pt.   Hypertension    IBS (irritable bowel syndrome)    Morphea    Seasonal allergies    Seasonal allergies    SVD (spontaneous vaginal delivery)    x 2   Thyroid disease    goiter and cyst on thyroid-followed by [place] yearly   Tubular adenoma of colon 04/2011   White coat syndrome without diagnosis of hypertension    while in office/sx-anxiety increases as well   Past Surgical History:  Procedure Laterality Date   COLONOSCOPY  2017   MS-MAC-suprep(exc)-hems-(hx of TA)-5 yr recall    CYSTOSCOPY N/A 01/12/2018   Procedure: CYSTOSCOPY;  Surgeon: Megan Salon, MD;  Location: [facility];  Service: Gynecology;  Laterality: N/A;   EXTRACORPOREAL SHOCK WAVE LITHOTRIPSY Right 10/09/2020   Procedure: EXTRACORPOREAL SHOCK WAVE LITHOTRIPSY (ESWL);  Surgeon: Janith Lima, MD;  Location: [facility];  Service: Urology;  Laterality: Right;   MAXIMUM ACCESS (MAS)POSTERIOR LUMBAR INTERBODY FUSION (PLIF) 1 LEVEL N/A 02/01/2014   Procedure: LUMBAR FIVE-SACFAL ONE POSTERIOR LUMBAR INTERBODY FUSION,GIL PROCEDURE;  Surgeon: Erline Levine, MD;  Location: [facility];  Service: Neurosurgery;  Laterality: N/A;   MENISCUS REPAIR Bilateral    R 07/2018, L 09/2018   POLYPECTOMY     TONSILLECTOMY     TOTAL LAPAROSCOPIC HYSTERECTOMY WITH SALPINGECTOMY Bilateral 01/12/2018   Procedure: TOTAL LAPAROSCOPIC HYSTERECTOMY WITH SALPINGECTOMY Possible BSO;  Surgeon: Megan Salon, MD;  Location: [facility];  Service: Gynecology;  Laterality: Bilateral;  possible BSO   UPPER GI ENDOSCOPY     reflux   WISDOM TOOTH EXTRACTION      FAMILY HISTORY Family History  Problem Relation Age of Onset   Diabetes Mother    Hypertension Mother    Cancer Father    Colon polyps Father        section of colon removed-unknown if CA related   Diabetes Father    Hypertension Sister    Colon polyps Brother 24   Colon polyps Paternal Grandfather 86   Colon cancer Paternal Grandfather 27   Breast cancer Cousin 87   Esophageal cancer Neg Hx    Rectal cancer Neg Hx    Stomach cancer Neg Hx     SOCIAL HISTORY Social History   Tobacco Use   Smoking status: Never   Smokeless tobacco: Never  Vaping Use   Vaping Use: Never used  Substance Use Topics   Alcohol use: Yes    Comment: social wine   Drug use: No         OPHTHALMIC EXAM:  Base Eye Exam     Visual Acuity (ETDRS)       Right Left   Dist cc 20/25 -1 20/30 -1   Dist ph cc  20/25 -1    Correction: Glasses         Tonometry  (Tonopen, 10:39 AM)       Right Left   Pressure 16 15         Pupils       Pupils Dark Light APD   Right PERRL 4 3 None   Left PERRL 4 3 None         Extraocular Movement       Right Left    Full  Full         Neuro/Psych     Oriented x3: Yes   Mood/Affect: Normal         Dilation     Left eye: 1.0% Mydriacyl, 2.5% Phenylephrine @ 10:39 AM           Slit Lamp and Fundus Exam     External Exam       Right Left   External Normal Normal         Slit Lamp Exam       Right Left   Lids/Lashes Normal Normal   Conjunctiva/Sclera White and quiet White and quiet   Cornea Clear Clear   Anterior Chamber Deep and quiet Deep and quiet   Iris Round and reactive Round and reactive   Lens 1+ Nuclear sclerosis 1+ Nuclear sclerosis   Anterior Vitreous Normal Normal         Fundus Exam       Right Left   Posterior Vitreous  Normal   Disc  Normal   C/D Ratio  0.45   Macula  Retinal pigment epithelial mottling, no subretinal fluid   Vessels  Normal   Periphery  Normal, pigmentary change superotemporal to meridian near the equator, no holes no tears            IMAGING AND PROCEDURES  Imaging and Procedures for 04/17/22  OCT, Retina - OU - Both Eyes       Right Eye Quality was good. Scan locations included subfoveal. Central Foveal Thickness: 262. Findings include normal observations, no SRF.   Left Eye Quality was good. Scan locations included subfoveal. Central Foveal Thickness: 221. Progression has improved. Findings include no SRF, subretinal hyper-reflective material.   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   OS, improved subfoveal region.  No subretinal fluid.  Drusenoid changes are developing  Deep to the retina pachychoroid is seen  Patient has been off medication termed eplerenone for 3 to 4 days        Color  Fundus Photography Optos - OU - Both Eyes       Right Eye Progression has been stable. Disc findings include normal observations. Macula : drusen. Vessels : normal observations. Periphery : normal observations.   Left Eye Progression has been stable. Macula : drusen, retinal pigment epithelium abnormalities. Vessels : normal observations. Periphery : normal observations.   Notes No subretinal fluid OS,  Superotemporal pigmentary change in the equator, no retinal breaks or tears              ASSESSMENT/PLAN:  Central serous chorioretinopathy of left eye Improved overall.  On eplerenone and topical dorzolamide.  Patient discontinued eplerenone 3 to 4 days previous  We will continue dorzolamide topically left eye and attempt to trial off of eplerenone in order to determine efficacy of continued monitoring alone if recurrent symptoms develop patient to notify the office promptly     ICD-10-CM   1. Central serous chorioretinopathy of left eye  H35.712 OCT, Retina - OU - Both Eyes    Color Fundus Photography Optos - OU - Both Eyes      1.  OS, resolved central serous retinopathy.  On topical dorzolamide twice daily as well as using eplerenone in the past.  Off eplerenone now for 3 days  2.  Therapeutic trial off of eplerenone to determine if this can be discontinued without risk of recurrence  of the CS CR OS  3.  Patient to follow-up here in 6 months or as needed  4.  Patient may also attempt to the use of dorzolamide 5 more days and then discontinue it and if her allergy symptoms improved, that she may have mild allergy to the topical dorzolamide that enters the nasopharynx.  Ophthalmic Meds Ordered this visit:  No orders of the defined types were placed in this encounter.      Return in about 6 months (around 10/17/2022) for DILATE OU, COLOR FP, OCT.  There are no Patient Instructions on file for this visit.   Explained the diagnoses, plan, and follow up with the  patient and they expressed understanding.  Patient expressed understanding of the importance of proper follow up care.   Clent Demark Basma Buchner M.D. Diseases & Surgery of the Retina and Vitreous Retina & Diabetic [place] 04/17/22     Abbreviations: M myopia (nearsighted); A astigmatism; H hyperopia (farsighted); P presbyopia; Mrx spectacle prescription;  CTL contact lenses; OD right eye; OS left eye; OU both eyes  XT exotropia; ET esotropia; PEK punctate epithelial keratitis; PEE punctate epithelial erosions; DES dry eye syndrome; MGD meibomian gland dysfunction; ATs artificial tears; PFAT's preservative free artificial tears; [place] nuclear sclerotic cataract; PSC posterior subcapsular cataract; ERM epi-retinal membrane; PVD posterior vitreous detachment; RD retinal detachment; DM diabetes mellitus; DR diabetic retinopathy; NPDR non-proliferative diabetic retinopathy; PDR proliferative diabetic retinopathy; CSME clinically significant macular edema; DME diabetic macular edema; dbh dot blot hemorrhages; CWS cotton wool spot; POAG primary open angle glaucoma; C/D cup-to-disc ratio; HVF humphrey visual field; GVF goldmann visual field; OCT optical coherence tomography; IOP intraocular pressure; BRVO Branch retinal vein occlusion; CRVO central retinal vein occlusion; CRAO central retinal artery occlusion; BRAO branch retinal artery occlusion; RT retinal tear; SB scleral buckle; PPV pars plana vitrectomy; VH Vitreous hemorrhage; PRP panretinal laser photocoagulation; IVK intravitreal kenalog; VMT vitreomacular traction; MH Macular hole;  NVD neovascularization of the disc; NVE neovascularization elsewhere; AREDS age related eye disease study; ARMD age related macular degeneration; POAG primary open angle glaucoma; EBMD epithelial/anterior basement membrane dystrophy; ACIOL anterior chamber intraocular lens; IOL intraocular lens; PCIOL posterior chamber intraocular lens; Phaco/IOL phacoemulsification with intraocular  lens placement; [place] photorefractive keratectomy; LASIK laser assisted in situ keratomileusis; HTN hypertension; DM diabetes mellitus; COPD chronic obstructive pulmonary disease

## 2022-04-17 NOTE — Assessment & Plan Note (Addendum)
Improved overall.  On eplerenone and topical dorzolamide.  Patient discontinued eplerenone 3 to 4 days previous  We will continue dorzolamide topically left eye and attempt to trial off of eplerenone in order to determine efficacy of continued monitoring alone if recurrent symptoms develop patient to notify the office promptly

## 2022-05-02 ENCOUNTER — Other Ambulatory Visit: Payer: Self-pay | Admitting: Family Medicine

## 2022-05-02 DIAGNOSIS — Z1231 Encounter for screening mammogram for malignant neoplasm of breast: Secondary | ICD-10-CM

## 2022-05-28 ENCOUNTER — Other Ambulatory Visit: Payer: Self-pay | Admitting: Gastroenterology

## 2022-06-10 ENCOUNTER — Ambulatory Visit
Admission: RE | Admit: 2022-06-10 | Discharge: 2022-06-10 | Disposition: A | Discharge status: Home / Self Care | Payer: Managed Care, Other (non HMO) | Source: Ambulatory Visit | Attending: Family Medicine | Admitting: Family Medicine

## 2022-06-10 DIAGNOSIS — Z1231 Encounter for screening mammogram for malignant neoplasm of breast: Secondary | ICD-10-CM

## 2022-06-11 ENCOUNTER — Ambulatory Visit: Payer: Managed Care, Other (non HMO)

## 2022-06-18 ENCOUNTER — Telehealth: Payer: Self-pay | Admitting: Gastroenterology

## 2022-06-18 NOTE — Telephone Encounter (Signed)
Inbound call from patient stating that she is in need for a refill for Omeprazole. Please advise.

## 2022-06-19 MED ORDER — OMEPRAZOLE 20 MG PO CPDR: DELAYED_RELEASE_CAPSULE | ORAL | 0 refills | Status: DC

## 2022-06-19 NOTE — Telephone Encounter (Signed)
Prescription sent to patient's pharmacy and informed patient on prescription to schedule an appt for further refills.

## 2022-07-01 ENCOUNTER — Ambulatory Visit (INDEPENDENT_AMBULATORY_CARE_PROVIDER_SITE_OTHER): Payer: Managed Care, Other (non HMO) | Admitting: Ophthalmology

## 2022-07-01 DIAGNOSIS — H2513 Age-related nuclear cataract, bilateral: Secondary | ICD-10-CM | POA: Diagnosis not present

## 2022-07-01 DIAGNOSIS — H353131 Nonexudative age-related macular degeneration, bilateral, early dry stage: Secondary | ICD-10-CM | POA: Diagnosis not present

## 2022-07-01 DIAGNOSIS — H35712 Central serous chorioretinopathy, left eye: Secondary | ICD-10-CM | POA: Diagnosis not present

## 2022-07-01 NOTE — Assessment & Plan Note (Signed)
No signs of recurrence.  Patient off of topical dorzolamide.  Patient does have a systemic allergy to sulfa.  Trial of visual monitoring and anatomical monitoring off of topical dorzolamide successful.  Patient also reports enhance and improved symptoms with less allergy type symptoms in the upper airways

## 2022-07-01 NOTE — Assessment & Plan Note (Signed)
OU doing well.  Minimal changes of ARMD but nonetheless present.  No signs of complication.

## 2022-07-01 NOTE — Progress Notes (Signed)
07/01/2022     CHIEF COMPLAINT Patient presents for  Chief Complaint  Patient presents with   Retina Follow Up      HISTORY OF PRESENT ILLNESS: Shelly Gilbert is a 61 y.o. female who presents to the clinic today for:   HPI     Retina Follow Up           Diagnosis: Other   Laterality: left eye   Severity: moderate   Course: stable         Comments   6 MOS FOR DILATE OU, OCT, FP. Pt stated, "I noticed when I look at the one object to another thing, my vision is a little blurry. Another reason why I am here is because I was on the [place] and I was told to switch off on the drops. I am allergic to SULFA and I wanted to know if I can be officially off of it."       Last edited by Silvestre Moment on 07/01/2022 10:12 AM.      Referring physician: Shirline Frees, MD [address] [place],  [place] [address]  HISTORICAL INFORMATION:   Selected notes from the MEDICAL RECORD NUMBER       CURRENT MEDICATIONS: Current Outpatient Medications (Ophthalmic Drugs)  Medication Sig   dorzolamide (TRUSOPT) 2 % ophthalmic solution Place 1 drop into the left eye 3 (three) times daily.   dorzolamide (TRUSOPT) 2 % ophthalmic solution Place 1 drop into the left eye 3 (three) times daily.   No current facility-administered medications for this visit. (Ophthalmic Drugs)   Current Outpatient Medications (Other)  Medication Sig   COLLAGEN PO Take by mouth.   ELDERBERRY PO Take by mouth.   eplerenone (INSPRA) 25 MG tablet Take 0.5 tablets by mouth daily.   Fiber CHEW Chew 1 tablet by mouth daily as needed.   KRILL OIL PO Take 1 capsule by mouth daily.   Multiple Vitamin (MULTIVITAMIN) tablet Take 1 tablet by mouth daily.   Multiple Vitamins-Minerals (EYE VITAMINS PO) Take 1 capsule by mouth 2 (two) times daily.   NONFORMULARY OR COMPOUNDED ITEM Estradiol cream 0.02% in 72m prefilled syringes.  174mpv twice weekly.  #[id number].   omeprazole (PRILOSEC) 20 MG capsule  TAKE 1 CAPSULE BY MOUTH EVERY DAY   ondansetron (ZOFRAN-ODT) 4 MG disintegrating tablet Take 1 tablet (4 mg total) by mouth every 8 (eight) hours as needed for nausea or vomiting.   OPZELURA 1.5 % CREA Apply 1 Dose topically daily.   OVER THE COUNTER MEDICATION Take 1 Dose by mouth daily. OAT powder   oxyCODONE-acetaminophen (PERCOCET/ROXICET) 5-325 MG tablet Take 1-2 tablets by mouth every 8 (eight) hours as needed for severe pain.   Probiotic Product (DIGESTIVE ADVANTAGE PO) Take 1 tablet by mouth daily.   tretinoin (RETIN-A) 0.025 % cream tretinoin 0.025 % topical cream  APPLY CREAM TOPICALLY TO AFFECTED AREA AT BEDTIME   Turmeric 500 MG CAPS Take by mouth daily.   VITAMIN D, ERGOCALCIFEROL, PO Take 1,000 Units by mouth daily.   No current facility-administered medications for this visit. (Other)      REVIEW OF SYSTEMS: ROS   Negative for: Constitutional, Gastrointestinal, Neurological, Skin, Genitourinary, Musculoskeletal, HENT, Endocrine, Cardiovascular, Eyes, Respiratory, Psychiatric, Allergic/Imm, Heme/Lymph Last edited by Ma,Silvestre Moment 07/01/2022 10:12 AM.       ALLERGIES Allergies  Allergen Reactions   Codeine Other (See Comments)    Severe nightmares   Sulfa Antibiotics Hives  PAST MEDICAL HISTORY Past Medical History:  Diagnosis Date   Adenomyosis    Anemia    Anxiety    situational per pt.   Arthritis    RIGHT knee/ bilateral hands/RIGHT foot   Bursitis of hip    left   Chronic kidney disease    hx of kidney stones   Floaters, bilateral    GERD (gastroesophageal reflux disease)    on meds   Hemorrhoids    Hiatal hernia    Hyperlipidemia    borderline, controlled with diet per pt.   Hypertension    IBS (irritable bowel syndrome)    Morphea    Seasonal allergies    Seasonal allergies    SVD (spontaneous vaginal delivery)    x 2   Thyroid disease    goiter and cyst on thyroid-followed by [place] yearly   Tubular adenoma of colon 04/2011   White coat  syndrome without diagnosis of hypertension    while in office/sx-anxiety increases as well   Past Surgical History:  Procedure Laterality Date   COLONOSCOPY  2017   MS-MAC-suprep(exc)-hems-(hx of TA)-5 yr recall   CYSTOSCOPY N/A 01/12/2018   Procedure: CYSTOSCOPY;  Surgeon: Megan Salon, MD;  Location: [facility];  Service: Gynecology;  Laterality: N/A;   EXTRACORPOREAL SHOCK WAVE LITHOTRIPSY Right 10/09/2020   Procedure: EXTRACORPOREAL SHOCK WAVE LITHOTRIPSY (ESWL);  Surgeon: Janith Lima, MD;  [facility];  Service: Urology;  Laterality: Right;   MAXIMUM ACCESS (MAS)POSTERIOR LUMBAR INTERBODY FUSION (PLIF) 1 LEVEL N/A 02/01/2014   Procedure: LUMBAR FIVE-SACFAL ONE POSTERIOR LUMBAR INTERBODY FUSION,GIL PROCEDURE;  Surgeon: Erline Levine, MD;  Location: [facility];  Service: Neurosurgery;  Laterality: N/A;   MENISCUS REPAIR Bilateral    R 07/2018, L 09/2018   POLYPECTOMY     TONSILLECTOMY     TOTAL LAPAROSCOPIC HYSTERECTOMY WITH SALPINGECTOMY Bilateral 01/12/2018   Procedure: TOTAL LAPAROSCOPIC HYSTERECTOMY WITH SALPINGECTOMY Possible BSO;  Surgeon: Megan Salon, MD;  Location: [facility];  Service: Gynecology;  Laterality: Bilateral;  possible BSO   UPPER GI ENDOSCOPY     reflux   WISDOM TOOTH EXTRACTION      FAMILY HISTORY Family History  Problem Relation Age of Onset   Diabetes Mother    Hypertension Mother    Cancer Father    Colon polyps Father        section of colon removed-unknown if CA related   Diabetes Father    Hypertension Sister    Colon polyps Brother 15   Colon polyps Paternal Grandfather 7   Colon cancer Paternal Grandfather 79   Breast cancer Cousin 43   Esophageal cancer Neg Hx    Rectal cancer Neg Hx    Stomach cancer Neg Hx     SOCIAL HISTORY Social History   Tobacco Use   Smoking status: Never   Smokeless tobacco: Never  Vaping Use   Vaping Use: Never used  Substance Use Topics   Alcohol use: Yes    Comment: social  wine   Drug use: No         OPHTHALMIC EXAM:  Base Eye Exam     Visual Acuity (ETDRS)       Right Left   Dist cc 20/20 -2 20/25 -1    Correction: Glasses         Tonometry (Tonopen, 10:17 AM)       Right Left   Pressure 16 18         Pupils  Pupils APD   Right PERRL None   Left PERRL None         Visual Fields       Left Right    Full Full         Extraocular Movement       Right Left    Full, Ortho Full, Ortho         Neuro/Psych     Oriented x3: Yes   Mood/Affect: Normal         Dilation     Both eyes: 1.0% Mydriacyl, 2.5% Phenylephrine @ 10:17 AM           Slit Lamp and Fundus Exam     External Exam       Right Left   External Normal Normal         Slit Lamp Exam       Right Left   Lids/Lashes Normal Normal   Conjunctiva/Sclera White and quiet White and quiet   Cornea Clear Clear   Anterior Chamber Deep and quiet Deep and quiet   Iris Round and reactive Round and reactive   Lens 1+ Nuclear sclerosis 1+ Nuclear sclerosis   Anterior Vitreous Normal Normal         Fundus Exam       Right Left   Posterior Vitreous Normal Normal   Disc Normal Normal   C/D Ratio 0.45 0.45   Macula Hard drusen Retinal pigment epithelial mottling, no subretinal fluid   Vessels Normal Normal   Periphery Normal Normal, pigmentary change superotemporal to meridian near the equator, no holes no tears            IMAGING AND PROCEDURES  Imaging and Procedures for 07/01/22  OCT, Retina - OU - Both Eyes       Right Eye Quality was good. Scan locations included subfoveal. Central Foveal Thickness: 264. Findings include normal observations, no SRF.   Left Eye Quality was good. Scan locations included subfoveal. Central Foveal Thickness: 223. Progression has improved. Findings include no SRF, subretinal hyper-reflective material.   Notes OD, History of central serous retinopathy with large chronic central serous retinal  detachment, which underwent focal laser treatment November 2020.   OS, improved subfoveal region.  No subretinal fluid.  Drusenoid changes are developing  Deep to the retina pachychoroid is seen  Patient has been off medication termed eplerenone for 3 to 4 days        Color Fundus Photography Optos - OU - Both Eyes       Right Eye Progression has been stable. Disc findings include normal observations. Macula : drusen. Vessels : normal observations. Periphery : normal observations.   Left Eye Progression has been stable. Macula : drusen, retinal pigment epithelium abnormalities. Vessels : normal observations. Periphery : normal observations.   Notes No subretinal fluid OS,  Superotemporal pigmentary change in the equator, no retinal breaks or tears              ASSESSMENT/PLAN:  Central serous chorioretinopathy of left eye No signs of recurrence.  Patient off of topical dorzolamide.  Patient does have a systemic allergy to sulfa.  Trial of visual monitoring and anatomical monitoring off of topical dorzolamide successful.  Patient also reports enhance and improved symptoms with less allergy type symptoms in the upper airways  Early stage nonexudative age-related macular degeneration of both eyes OU doing well.  Minimal changes of ARMD but nonetheless present.  No signs of complication.  Nuclear sclerotic  cataract of both eyes Follow-up with [facility] eye care as scheduled     ICD-10-CM   1. Central serous chorioretinopathy of left eye  H35.712 OCT, Retina - OU - Both Eyes    Color Fundus Photography Optos - OU - Both Eyes    2. Early stage nonexudative age-related macular degeneration of both eyes  H35.3131     3. Nuclear sclerotic cataract of both eyes  H25.13       1.  OU doing well.  No signs recurrence of CS CR.  We will continue to monitor  2.  Oral AREDS 2 vitamin continued.  3.  Follow-up Dr. Herbert Deaner as scheduled  Ophthalmic Meds Ordered this visit:   No orders of the defined types were placed in this encounter.      Return in about 1 year (around 07/02/2023) for DILATE OU, COLOR FP, OCT.  There are no Patient Instructions on file for this visit.   Explained the diagnoses, plan, and follow up with the patient and they expressed understanding.  Patient expressed understanding of the importance of proper follow up care.   Clent Demark Arlin Sass M.D. Diseases & Surgery of the Retina and Vitreous [facility]ina [facility] Di[facility] [place] 07/01/22     Abbreviations: M myopia (nearsighted); A astigmatism; H hyperopia (farsighted); P presbyopia; Mrx spectacle prescription;  CTL contact lenses; OD right eye; OS left eye; OU both eyes  XT exotropia; ET esotropia; PEK punctate epithelial keratitis; PEE punctate epithelial erosions; DES dry eye syndrome; MGD meibomian gland dysfunction; ATs artificial tears; PFAT's preservative free artificial tears; [place] nuclear sclerotic cataract; PSC posterior subcapsular cataract; ERM epi-retinal membrane; PVD posterior vitreous detachment; RD retinal detachment; DM diabetes mellitus; DR diabetic retinopathy; NPDR non-proliferative diabetic retinopathy; PDR proliferative diabetic retinopathy; CSME clinically significant macular edema; DME diabetic macular edema; dbh dot blot hemorrhages; CWS cotton wool spot; POAG primary open angle glaucoma; C/D cup-to-disc ratio; HVF humphrey visual field; GVF goldmann visual field; OCT optical coherence tomography; IOP intraocular pressure; BRVO Branch retinal vein occlusion; CRVO central retinal vein occlusion; CRAO central retinal artery occlusion; BRAO branch retinal artery occlusion; RT retinal tear; SB scleral buckle; PPV pars plana vitrectomy; VH Vitreous hemorrhage; PRP panretinal laser photocoagulation; IVK intravitreal kenalog; VMT vitreomacular traction; MH Macular hole;  NVD neovascularization of the disc; NVE neovascularization elsewhere; AREDS age related eye disease study; ARMD age  related macular degeneration; POAG primary open angle glaucoma; EBMD epithelial/anterior basement membrane dystrophy; ACIOL anterior chamber intraocular lens; IOL intraocular lens; PCIOL posterior chamber intraocular lens; Phaco/IOL phacoemulsification with intraocular lens placement; [place] photorefractive keratectomy; LASIK laser assisted in situ keratomileusis; HTN hypertension; DM diabetes mellitus; COPD chronic obstructive pulmonary disease

## 2022-07-01 NOTE — Assessment & Plan Note (Signed)
Follow-up with [facility] eye care as scheduled

## 2022-07-13 ENCOUNTER — Other Ambulatory Visit: Payer: Self-pay | Admitting: Gastroenterology

## 2022-07-15 ENCOUNTER — Telehealth: Payer: Self-pay | Admitting: Gastroenterology

## 2022-07-15 MED ORDER — OMEPRAZOLE 20 MG PO CPDR: 20.0000 mg | DELAYED_RELEASE_CAPSULE | Freq: Every day | ORAL | 0 refills | Status: DC

## 2022-07-15 NOTE — Telephone Encounter (Signed)
Per patient's request omeprazole 20 mg sent to [facility] (purple brand manufacture only). Informed patient she is due for follow up with Dr. Fuller Plan. Patient scheduled appt for 09/18/22 at 3pm.

## 2022-07-15 NOTE — Telephone Encounter (Signed)
Inbound call from patient states she was filled for her rx omerprazole but pharmacy keeps giving her a "pink" one, says it doesn't help like the "purple" one. She is requesting a call back to further advise.

## 2022-08-12 ENCOUNTER — Encounter: Payer: Self-pay | Admitting: *Deleted

## 2022-09-18 ENCOUNTER — Ambulatory Visit: Payer: Managed Care, Other (non HMO) | Admitting: Gastroenterology

## 2022-09-18 ENCOUNTER — Telehealth: Payer: Self-pay | Admitting: Gastroenterology

## 2022-09-18 NOTE — Telephone Encounter (Signed)
Good morning Dr. Fuller Plan,   Patient called stating that she needed to cancel her appointment with you today at 3:00 due to being up all night getting sick.  Patient stated that she will call back later on this afternoon to reschedule.

## 2022-09-25 ENCOUNTER — Other Ambulatory Visit: Payer: Self-pay | Admitting: Gastroenterology

## 2022-10-17 ENCOUNTER — Encounter (INDEPENDENT_AMBULATORY_CARE_PROVIDER_SITE_OTHER): Payer: Managed Care, Other (non HMO) | Admitting: Ophthalmology

## 2022-10-30 ENCOUNTER — Ambulatory Visit: Payer: Managed Care, Other (non HMO) | Admitting: Gastroenterology

## 2022-10-30 ENCOUNTER — Encounter: Payer: Self-pay | Admitting: Gastroenterology

## 2022-10-30 VITALS — BP 160/90 | HR 105 | Ht 63.0 in | Wt 138.0 lb

## 2022-10-30 DIAGNOSIS — K219 Gastro-esophageal reflux disease without esophagitis: Secondary | ICD-10-CM

## 2022-10-30 DIAGNOSIS — Z86010 Personal history of colonic polyps: Secondary | ICD-10-CM | POA: Diagnosis not present

## 2022-10-30 MED ORDER — OMEPRAZOLE 20 MG PO CPDR: 20.0000 mg | DELAYED_RELEASE_CAPSULE | Freq: Every day | ORAL | 3 refills | Status: DC

## 2022-10-30 NOTE — Patient Instructions (Signed)
We have sent the following prescriptions to your mail in pharmacy: omeprazole   If you have not heard from your mail in pharmacy within 1 week or if you have not received your medication in the mail, please contact us at [phone] so we may find out why.   You have been scheduled for an endoscopy. Please follow written instructions given to you at your visit today. If you use inhalers (even only as needed), please bring them with you on the day of your procedure.  The [facility] providers would like to encourage you to use [facility] to communicate with providers for non-urgent requests or questions.  Due to long hold times on the telephone, sending your provider a message by [facility] may be a faster and more efficient way to get a response.  Please allow 48 business hours for a response.  Please remember that this is for non-urgent requests.   Due to recent changes in healthcare laws, you may see the results of your imaging and laboratory studies on MyChart before your provider has had a chance to review them.  We understand that in some cases there may be results that are confusing or concerning to you. Not all laboratory results come back in the same time frame and the provider may be waiting for multiple results in order to interpret others.  Please give [place] 48 hours in order for your provider to thoroughly review all the results before contacting the office for clarification of your results.   Thank you for choosing me and [facility].  Pricilla Riffle. Dagoberto Ligas., MD., Marval Regal

## 2022-10-30 NOTE — Progress Notes (Signed)
    Assessment     GERD, R/O LPR, esophagitis, Barrett's Family history of colon polyps, multiple first-degree relatives, and personal history of adenomatous colon polyps   Recommendations    Recommended increasing omeprazole to 40 mg bid for suspected LPR and proceeding with EGD. She prefers to remain on omeprazole 20 mg qd at least until her EGD.  Closely follow antireflux measures.  The risks (including bleeding, perforation, infection, missed lesions, medication reactions and possible hospitalization or surgery if complications occur), benefits, and alternatives to endoscopy with possible biopsy and possible dilation were discussed with the patient and they consent to proceed.  Surveillance colonoscopy recommended in August 2027. Recommended ENT referral for further evaluation of hoarseness and she prefers to wait till after their endoscopy to proceed.   HPI    This is a 62 year old female with hoarseness, GERD. Symptoms have increased since Claritin was stopped.  She has been maintained on omeprazole 20 mg daily.  She prefers a specific purple omeprazole as it works more effectively for her than other omeprazole options.  She was bothered in the past by right upper quadrant pain and abdominal bloating which have resolved.  She underwent EGD in 1990.  Colonoscopy Aug 2022 - One 4 mm polyp in the transverse colon, removed with a cold snare. Resected and retrieved. (tubular adenoma) - External and internal hemorrhoids. - The examination was otherwise normal on direct and retroflexion views.  EGD May 1990 small hiatal hernia with reflux esophagitis  Labs / Imaging       Latest Ref Rng & Units 07/10/2018    4:00 PM 02/26/2018   11:14 AM 02/26/2018    9:50 AM  Hepatic Function  Total Protein 6.0 - 8.5 g/dL 6.5  6.9  WILL FOLLOW  P  Albumin 3.5 - 5.5 g/dL 4.6  4.6  WILL FOLLOW  P  AST 0 - 40 IU/L 17  16  WILL FOLLOW  P  ALT 0 - 32 IU/L 13  13  WILL FOLLOW  P  Alk Phosphatase 39 - 117  IU/L 70  78  WILL FOLLOW  P  Total Bilirubin 0.0 - 1.2 mg/dL 0.2  0.2  WILL FOLLOW  P    P Preliminary result       Latest Ref Rng & Units 12/22/2021    6:06 AM 02/26/2018   10:15 AM 02/26/2018    9:50 AM  CBC  WBC 4.0 - 10.5 K/uL 10.9  9.7  CANCELED   Hemoglobin 12.0 - 15.0 g/dL 14.4  13.8    Hematocrit 36.0 - 46.0 % 44.7  40.8    Platelets 150 - 400 K/uL 227  240     Current Medications, Allergies, Past Medical History, Past Surgical History, Family History and Social History were reviewed in [employer] record.   Physical Exam: General: Well developed, well nourished, no acute distress Head: Normocephalic and atraumatic Eyes: Sclerae anicteric, EOMI Ears: Normal auditory acuity Mouth: No deformities or lesions noted Lungs: Clear throughout to auscultation Heart: Regular rate and rhythm; No murmurs, rubs or bruits Abdomen: Soft, non tender and non distended. No masses, hepatosplenomegaly or hernias noted. Normal Bowel sounds Rectal: Not done Musculoskeletal: Symmetrical with no gross deformities  Pulses:  Normal pulses noted Extremities: No edema or deformities noted Neurological: Alert oriented x 4, grossly nonfocal Psychological:  Alert and cooperative. Normal mood and affect   Javeion Cannedy T. Fuller Plan, MD 10/30/2022, 3:12 PM

## 2022-11-06 IMAGING — US US THYROID
1 series · 13 of 25 positions shown · non-contrast
Comparison: 02/22/2021

01/28/2020

07/17/2016

CLINICAL DATA: Multinodular goiter

EXAM:
THYROID ULTRASOUND
TECHNIQUE: Ultrasound examination of the thyroid gland and adjacent soft
tissues was performed.

[Series 1: us thyroid · 0.05mm/px · 13 of 104 slices shown]
[im 1/104]
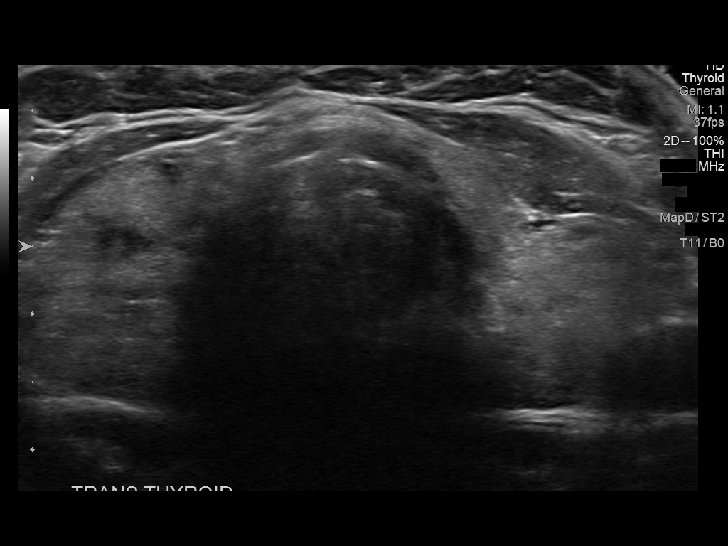
[im 9/104]
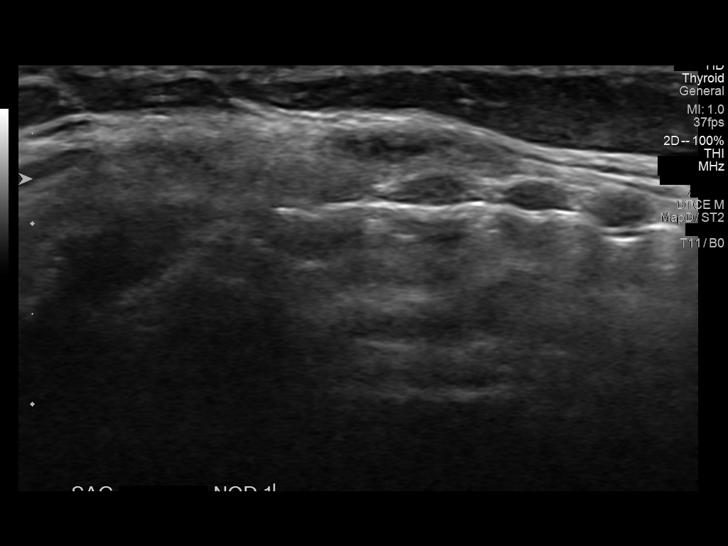
[im 18/104]
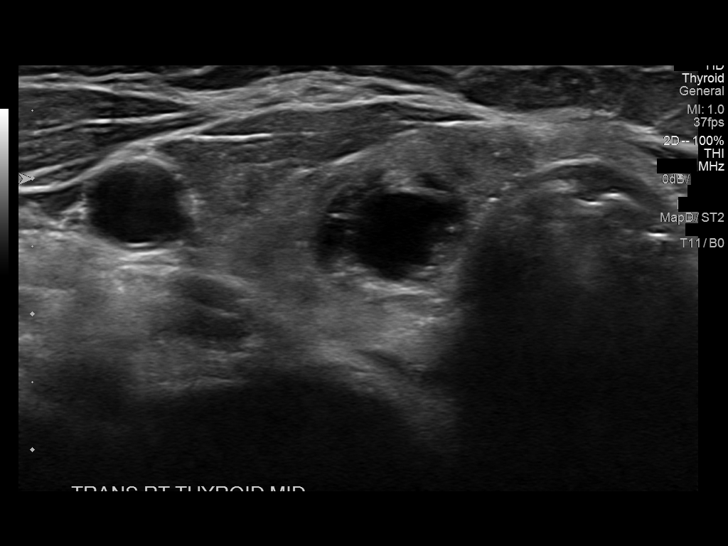
[im 26/104]
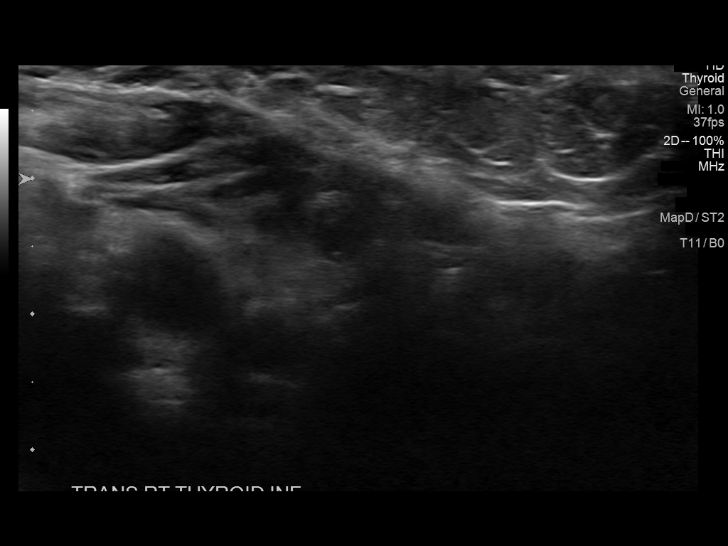
[im 35/104]
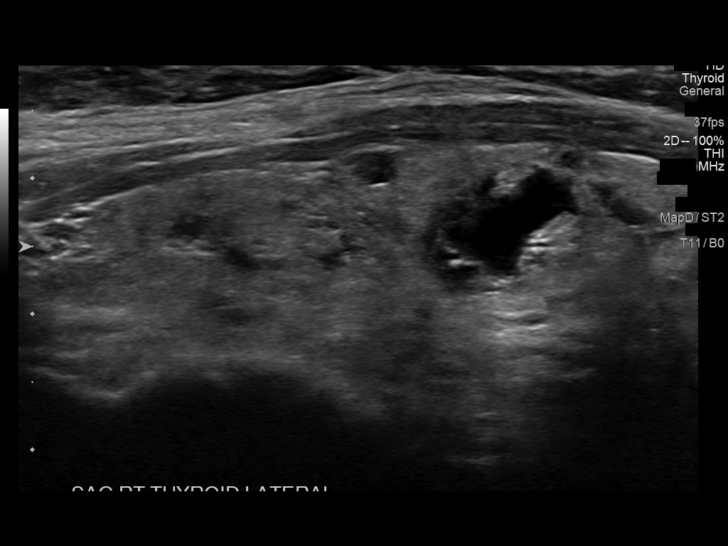
[im 43/104]
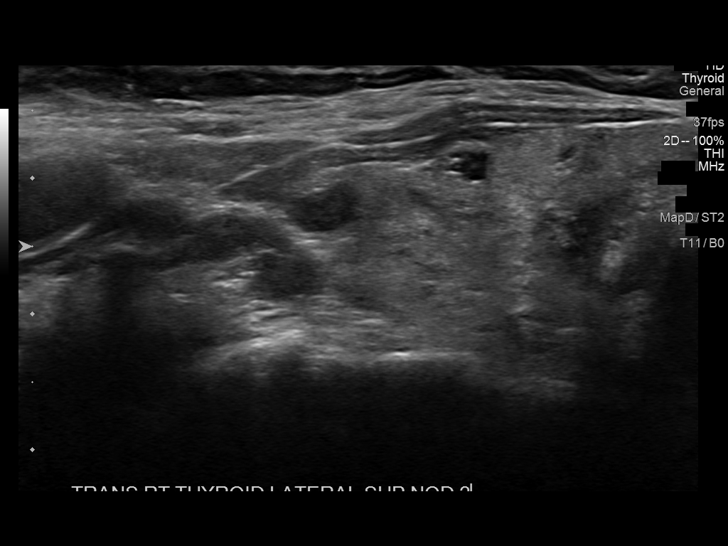
[im 52/104]
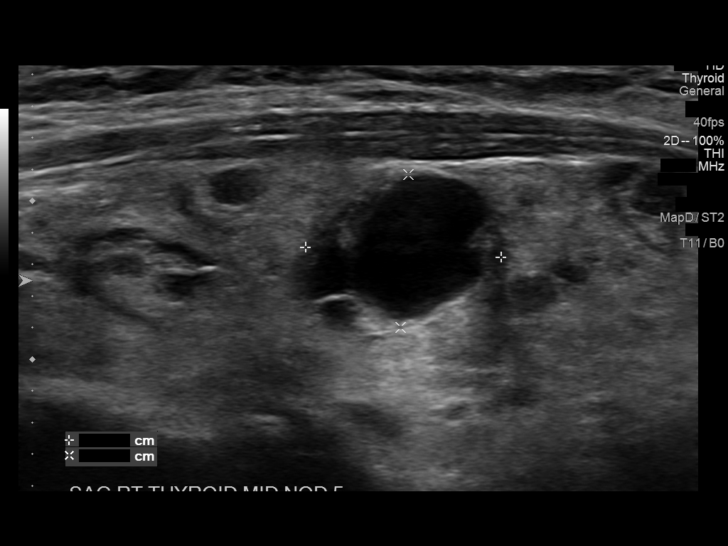
[im 61/104]
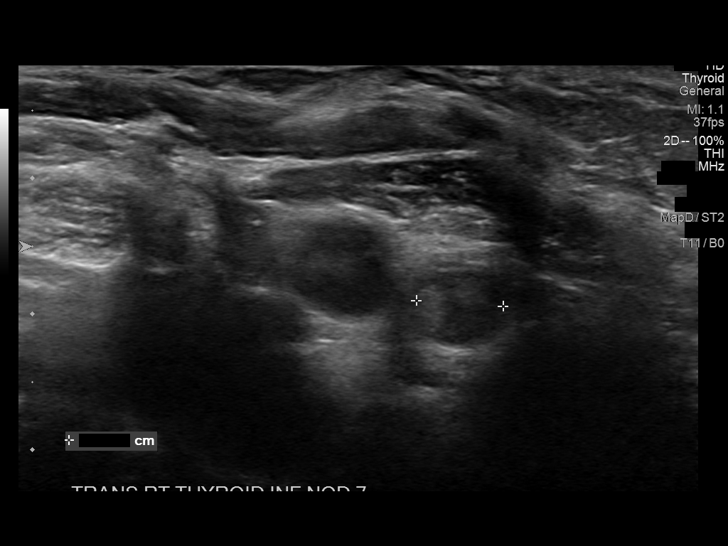
[im 69/104]
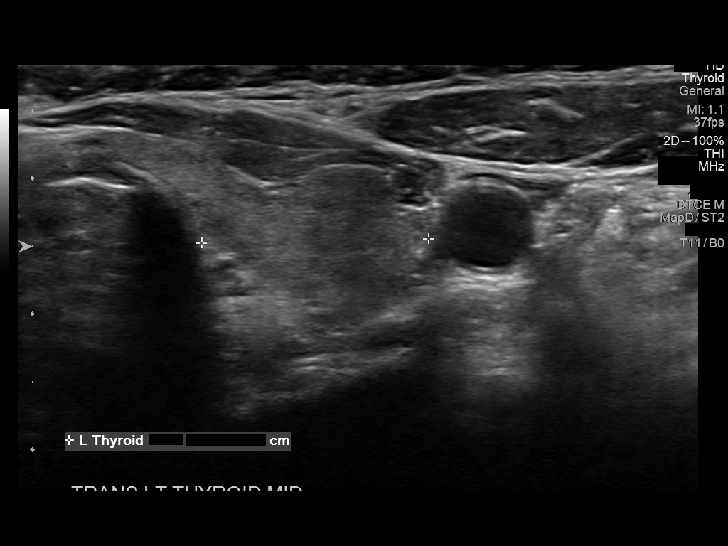
[im 78/104]
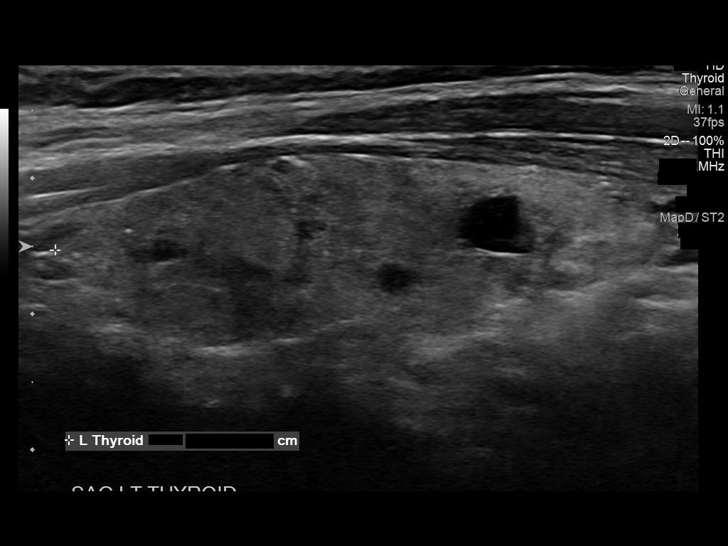
[im 86/104]
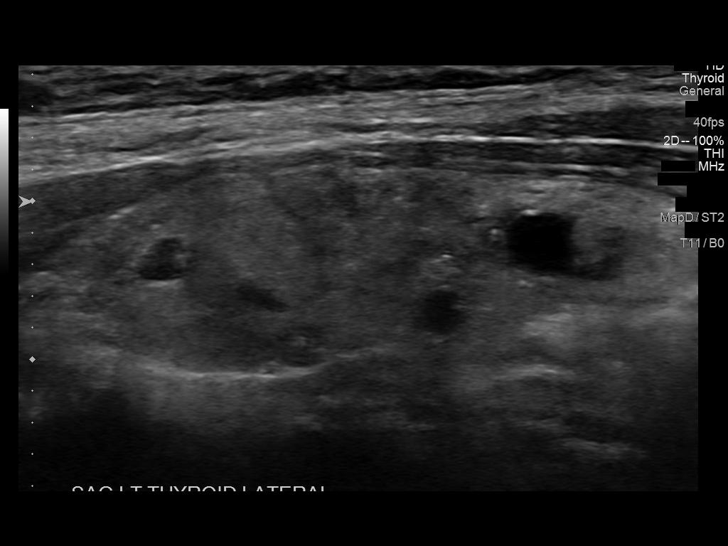
[im 95/104]
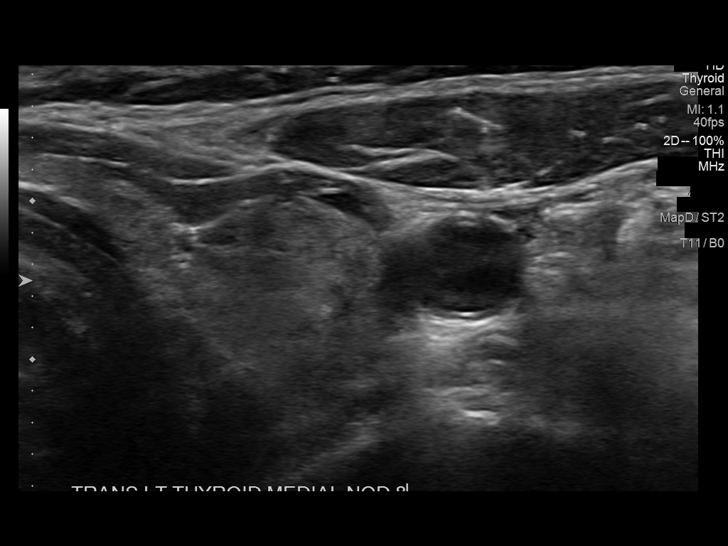
[im 104/104]
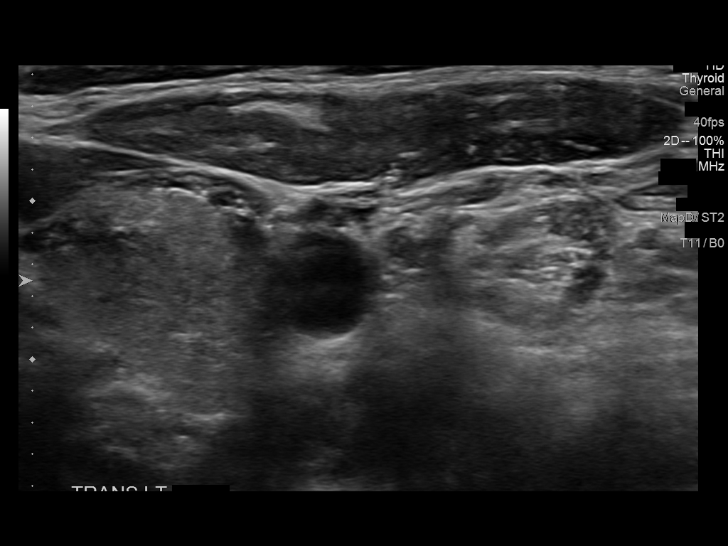

[13 of 25 positions shown; findings below may reference images not displayed]

FINDINGS: Parenchymal Echotexture: Mildly heterogenous

Isthmus: 0.4 cm

Right lobe: 5.0 x 1.8 x 1.7 cm

Left lobe: 4.6 x 1.6 x 1.7 cm

_________________________________________________________

Estimated total number of nodules >/= 1 cm: 3

Number of spongiform nodules >/=  2 cm not described below (TR1): 0

Number of mixed cystic and solid nodules >/= 1.5 cm not described
below (TR2): 0

_________________________________________________________

Nodule 5: Predominantly cystic 1.2 x 1.0 x 1.1 cm right mid thyroid
nodule does not meet criteria for imaging surveillance or FNA.

_________________________________________________________

Nodule 6: 1.1 x 0.5 x 1.0 cm isoechoic solid nodule in the inferior
right thyroid lobe does not meet criteria for imaging surveillance
or FNA.

_________________________________________________________

Nodule 8: 1.2 x 1.1 x 1.0 cm solid isoechoic nodule in the superior
left thyroid lobe is unchanged in size since prior examination. This
TI-RADS 3 nodule does not meet criteria for FNA or imaging
surveillance.

_________________________________________________________

Remaining thyroid nodules are subcentimeter and do not meet criteria
for FNA or imaging follow-up.
IMPRESSION: Numerous bilateral thyroid nodules, which do not meet criteria for
FNA or imaging follow-up.

The above is in keeping with the ACR TI-RADS recommendations - [HOSPITAL] 0468;[DATE].

## 2022-12-23 ENCOUNTER — Encounter: Payer: Managed Care, Other (non HMO) | Admitting: Gastroenterology

## 2023-01-15 ENCOUNTER — Encounter: Payer: Self-pay | Admitting: Gastroenterology

## 2023-01-15 ENCOUNTER — Ambulatory Visit (AMBULATORY_SURGERY_CENTER): Payer: BC Managed Care – PPO | Admitting: *Deleted

## 2023-01-15 VITALS — Ht 62.5 in | Wt 130.0 lb

## 2023-01-15 DIAGNOSIS — K219 Gastro-esophageal reflux disease without esophagitis: Secondary | ICD-10-CM

## 2023-01-15 NOTE — Progress Notes (Signed)
Pt's previsit is done over the phone and all paperwork (prep instructions) sent to patient. Pt's name and DOB verified at the beginning of the previsit. Pt denies any difficulty with ambulating.  No egg or soy allergy known to patient  No issues known to pt with past sedation with any surgeries or procedures Patient denies issues being intubated No FH of Malignant Hyperthermia Pt is not on diet pills Pt is not on  home 02  Pt is not on blood thinners  Pt denies issues with constipation  Pt is not on dialysis Pt denies any upcoming cardiac testing Pt encouraged to use to use Singlecare or Goodrx to reduce cost  Patient's chart reviewed by Osvaldo Angst CNRA prior to previsit and patient appropriate for the [place].  Previsit completed and red dot placed by patient's name on their procedure day (on provider's schedule).  . Visit by phone Pt states her weight is  Instructions reviewed with pt and pt states understanding. Instructed to review again prior to procedure. Pt states they will.  Instructions sent by mail with coupon and by my chart

## 2023-01-22 ENCOUNTER — Other Ambulatory Visit (HOSPITAL_COMMUNITY): Payer: Self-pay | Admitting: Family Medicine

## 2023-01-22 DIAGNOSIS — R2241 Localized swelling, mass and lump, right lower limb: Secondary | ICD-10-CM

## 2023-01-23 ENCOUNTER — Ambulatory Visit (HOSPITAL_COMMUNITY)
Admission: RE | Admit: 2023-01-23 | Discharge: 2023-01-23 | Disposition: A | Discharge status: Home / Self Care | Payer: BC Managed Care – PPO | Source: Ambulatory Visit | Attending: Cardiology | Admitting: Cardiology

## 2023-01-23 DIAGNOSIS — R2241 Localized swelling, mass and lump, right lower limb: Secondary | ICD-10-CM | POA: Insufficient documentation

## 2023-01-30 ENCOUNTER — Telehealth: Payer: Self-pay | Admitting: Gastroenterology

## 2023-01-30 NOTE — Telephone Encounter (Signed)
Inbound call from patient, would like to discuss with a nurse about stopping her vitamins/ supplements prior to her procedure. She states she had a list and she would like to discuss prior to EGD.

## 2023-01-30 NOTE — Telephone Encounter (Signed)
Returned patient call.  Patient asked to call back in an hour or so, we kept getting disconnected.  She was in a bad area.

## 2023-02-05 ENCOUNTER — Ambulatory Visit: Payer: BC Managed Care – PPO | Admitting: Gastroenterology

## 2023-02-05 ENCOUNTER — Encounter: Payer: Self-pay | Admitting: Gastroenterology

## 2023-02-05 VITALS — BP 102/66 | HR 96 | Temp 98.0°F | Resp 11 | Ht 63.0 in | Wt 130.0 lb

## 2023-02-05 DIAGNOSIS — K317 Polyp of stomach and duodenum: Secondary | ICD-10-CM

## 2023-02-05 DIAGNOSIS — K219 Gastro-esophageal reflux disease without esophagitis: Secondary | ICD-10-CM

## 2023-02-05 MED ORDER — SODIUM CHLORIDE 0.9 % IV SOLN: 500.0000 mL | INTRAVENOUS | Status: DC

## 2023-02-05 MED ORDER — OMEPRAZOLE 20 MG PO CPDR
40.0000 mg | DELAYED_RELEASE_CAPSULE | Freq: Two times a day (BID) | ORAL | 3 refills | Status: DC
Start: 2023-02-05 — End: 2023-02-06

## 2023-02-05 NOTE — Progress Notes (Signed)
Vss nad trans to pacu 

## 2023-02-05 NOTE — Patient Instructions (Addendum)
Thank you for coming in to see [place] today. Resume previous diet and medications today. Recommend to increase Omeprazole 40 mg by mouth twice per day. Follow anti-reflux measures, handout provided. Await pathology results, approximately 1-2 weeks. Follow up with ENT.     YOU HAD AN ENDOSCOPIC PROCEDURE TODAY AT THE [facility]:   Refer to the procedure report that was given to you for any specific questions about what was found during the examination.  If the procedure report does not answer your questions, please call your gastroenterologist to clarify.  If you requested that your care partner not be given the details of your procedure findings, then the procedure report has been included in a sealed envelope for you to review at your convenience later.  YOU SHOULD EXPECT: Some feelings of bloating in the abdomen. Passage of more gas than usual.  Walking can help get rid of the air that was put into your GI tract during the procedure and reduce the bloating. If you had a lower endoscopy (such as a colonoscopy or flexible sigmoidoscopy) you may notice spotting of blood in your stool or on the toilet paper. If you underwent a bowel prep for your procedure, you may not have a normal bowel movement for a few days.  Please Note:  You might notice some irritation and congestion in your nose or some drainage.  This is from the oxygen used during your   Following upper endoscopy (EGD)  Vomiting of blood or coffee ground material  New chest pain or pain under the shoulder blades  Painful or persistently difficult swallowing  New shortness of breath  Fever of 100F or higher  Black, tarry-looking stools  For urgent or emergent issues, a gastroenterologist can be reached at any hour by calling [phone]. Do not use MyChart messaging for urgent concerns.    DIET:  We do recommend a small meal at first, but then you may proceed to your regular diet.  Drink plenty of fluids but you  should avoid alcoholic beverages for 24 hours.  ACTIVITY:  You should plan to take it easy for the rest of today and you should NOT DRIVE or use heavy machinery until tomorrow (because of the sedation medicines used during the test).    FOLLOW UP: Our staff will call the number listed on your records the next business day following your procedure.  We will call around 7:15- 8:00 am to check on you and address any questions or concerns that you may have regarding the information given to you following your procedure. If we do not reach you, we will leave a message.     If any biopsies were taken you will be contacted by phone or by letter within the next 1-3 weeks.  Please call us at [phone] if you have not heard about the biopsies in 3 weeks.    SIGNATURES/CONFIDENTIALITY: You and/or your care partner have signed paperwork which will be entered into your electronic medical record.  These signatures attest to the fact that that the information above on your After Visit Summary has been reviewed and is understood.  Full responsibility of the confidentiality of this discharge information lies with you and/or your care-partner.

## 2023-02-05 NOTE — Progress Notes (Signed)
Called to room to assist during endoscopic procedure.  Patient ID and intended procedure confirmed with present staff. Received instructions for my participation in the procedure from the performing physician.  

## 2023-02-05 NOTE — Op Note (Signed)
[facility] Patient Name: Shelly Gilbert Procedure Date: 02/05/2023 8:29 AM MRN: [id number] Endoscopist: Meryl Dare , MD, [phone] Age: 62 Referring MD:  Date of Birth: 05-20-1961 Gender: Female Account #: [id number] Procedure:                Upper GI endoscopy Indications:              Gastroesophageal reflux disease Medicines:                Monitored Anesthesia Care Procedure:                Pre-Anesthesia Assessment:                           - Prior to the procedure, a History and Physical                            was performed, and patient medications and                            allergies were reviewed. The patient's tolerance of                            previous anesthesia was also reviewed. The risks                            and benefits of the procedure and the sedation                            options and risks were discussed with the patient.                            All questions were answered, and informed consent                            was obtained. Prior Anticoagulants: The patient has                            taken no anticoagulant or antiplatelet agents. ASA                            Grade Assessment: II - A patient with mild systemic                            disease. After reviewing the risks and benefits,                            the patient was deemed in satisfactory condition to                            undergo the procedure.                           After obtaining informed consent, the endoscope was  passed under direct vision. Throughout the                            procedure, the patient's blood pressure, pulse, and                            oxygen saturations were monitored continuously. The                            Olympus Scope [id number] was introduced through the                            mouth, and advanced to the second part of duodenum.                            The upper GI  endoscopy was accomplished without                            difficulty. The patient tolerated the procedure                            well. Scope In: Scope Out: Findings:                 The examined esophagus was normal.                           A small hiatal hernia was present.                           Four 4 to 6 mm sessile polyps with no bleeding and                            no stigmata of recent bleeding were found in the                            gastric fundus and in the gastric body. Biopsies                            were taken with a cold forceps for histology.                           The exam of the stomach was otherwise normal.                           The duodenal bulb and second portion of the                            duodenum were normal. Complications:            No immediate complications. Estimated Blood Loss:     Estimated blood loss was minimal. Impression:               - Normal esophagus.                           -  Small hiatal hernia.                           - Four gastric polyps. Biopsied.                           - Normal duodenal bulb and second portion of the                            duodenum. Recommendation:           - Patient has a contact number available for                            emergencies. The signs and symptoms of potential                            delayed complications were discussed with the                            patient. Return to normal activities tomorrow.                            Written discharge instructions were provided to the                            patient.                           - Resume previous diet.                           - Follow antireflux measures.                           - Continue present medications.                           - Advised to increase omeprazole to 40 mg bid, 1                            year of refills.                           - Await pathology results.                            - Follow up with ENT. Meryl Dare, MD 02/05/2023 8:53:25 AM This report has been signed electronically.

## 2023-02-05 NOTE — Progress Notes (Signed)
History & Physical  Primary Care Physician:  Johny Blamer, MD Primary Gastroenterologist: Claudette Head, MD  Impression / Plan:  GERD for EGD.   CHIEF COMPLAINT:  GERD   HPI: Shelly Gilbert is a 62 y.o. female with GERD for EGD.    Past Medical History:  Diagnosis Date   Adenomyosis    Anemia    Arthritis    RIGHT knee/ bilateral hands/RIGHT foot   Bursitis of hip    left   Central serous retinopathy    Chronic kidney disease    hx of kidney stones   Floaters, bilateral    GERD (gastroesophageal reflux disease)    on meds   Goiter    Hemorrhoids    Hiatal hernia    Hyperlipidemia    borderline, controlled with diet per pt.   IBS (irritable bowel syndrome)    Kidney stone    Morphea    Seasonal allergies    Seasonal allergies    SVD (spontaneous vaginal delivery)    x 2   Thyroid disease    goiter and cyst on thyroid-followed by [place] yearly   Tubular adenoma of colon 04/2011    Past Surgical History:  Procedure Laterality Date   COLONOSCOPY  2017   MS-MAC-suprep(exc)-hems-(hx of TA)-5 yr recall   CYSTOSCOPY N/A 01/12/2018   Procedure: CYSTOSCOPY;  Surgeon: Jerene Bears, MD;  Location: [facility];  Service: Gynecology;  Laterality: N/A;   EXTRACORPOREAL SHOCK WAVE LITHOTRIPSY Right 10/09/2020   Procedure: EXTRACORPOREAL SHOCK WAVE LITHOTRIPSY (ESWL);  Surgeon: [name], MD;  Location: [facility];  Service: Urology;  Laterality: Right;   MAXIMUM ACCESS (MAS)POSTERIOR LUMBAR INTERBODY FUSION (PLIF) 1 LEVEL N/A 02/01/2014   Procedure: LUMBAR FIVE-SACFAL ONE POSTERIOR LUMBAR INTERBODY FUSION,GIL PROCEDURE;  Surgeon: Maeola Harman, MD;  Location: [facility];  Service: Neurosurgery;  Laterality: N/A;   MENISCUS REPAIR Bilateral    R 07/2018, L 09/2018   POLYPECTOMY     TONSILLECTOMY     TOTAL LAPAROSCOPIC HYSTERECTOMY WITH SALPINGECTOMY Bilateral 01/12/2018   Procedure: TOTAL LAPAROSCOPIC HYSTERECTOMY WITH SALPINGECTOMY Possible BSO;   Surgeon: Jerene Bears, MD;  Location: [facility];  Service: Gynecology;  Laterality: Bilateral;  possible BSO   UPPER GI ENDOSCOPY     reflux   WISDOM TOOTH EXTRACTION      Prior to Admission medications   Medication Sig Start Date End Date Taking? Authorizing Provider  COLLAGEN PO Take by mouth.   Yes [provider]  ELDERBERRY PO Take by mouth.   Yes [provider]  Fiber CHEW Chew 1 tablet by mouth daily as needed.   Yes [provider]  KRILL OIL PO Take 1 capsule by mouth daily.   Yes [provider]  Multiple Vitamin (MULTIVITAMIN) tablet Take 1 tablet by mouth daily.   Yes [provider]  Multiple Vitamins-Minerals (EYE VITAMINS PO) Take 1 capsule by mouth 2 (two) times daily.   Yes [provider]  omeprazole (PRILOSEC) 20 MG capsule Take 1 capsule (20 mg total) by mouth daily. 10/30/22  Yes Meryl Dare, MD  Probiotic Product (DIGESTIVE ADVANTAGE PO) Take 1 tablet by mouth daily.   Yes [provider]  tretinoin (RETIN-A) 0.025 % cream tretinoin 0.025 % topical cream  APPLY CREAM TOPICALLY TO AFFECTED AREA AT BEDTIME   Yes [provider]  dorzolamide (TRUSOPT) 2 % ophthalmic solution Place 1 drop into the left eye 3 (three) times daily. 10/01/21  Rankin, Alford Highland, MD  OVER THE COUNTER MEDICATION Take 1 Dose by mouth daily. OAT powder Patient not taking: Reported on 10/30/2022    [provider]  VITAMIN D, ERGOCALCIFEROL, PO Take 1,000 Units by mouth daily. Patient not taking: Reported on 02/05/2023    [provider]    Current Outpatient Medications  Medication Sig Dispense Refill   COLLAGEN PO Take by mouth.     ELDERBERRY PO Take by mouth.     Fiber CHEW Chew 1 tablet by mouth daily as needed.     KRILL OIL PO Take 1 capsule by mouth daily.     Multiple Vitamin (MULTIVITAMIN) tablet Take 1 tablet by mouth daily.     Multiple Vitamins-Minerals (EYE VITAMINS PO) Take 1 capsule by  mouth 2 (two) times daily.     omeprazole (PRILOSEC) 20 MG capsule Take 1 capsule (20 mg total) by mouth daily. 90 capsule 3   Probiotic Product (DIGESTIVE ADVANTAGE PO) Take 1 tablet by mouth daily.     tretinoin (RETIN-A) 0.025 % cream tretinoin 0.025 % topical cream  APPLY CREAM TOPICALLY TO AFFECTED AREA AT BEDTIME     dorzolamide (TRUSOPT) 2 % ophthalmic solution Place 1 drop into the left eye 3 (three) times daily. 10 mL 6   OVER THE COUNTER MEDICATION Take 1 Dose by mouth daily. OAT powder (Patient not taking: Reported on 10/30/2022)     VITAMIN D, ERGOCALCIFEROL, PO Take 1,000 Units by mouth daily. (Patient not taking: Reported on 02/05/2023)     Current Facility-Administered Medications  Medication Dose Route Frequency Provider Last Rate Last Admin   0.9 %  sodium chloride infusion  500 mL Intravenous Continuous Meryl Dare, MD        Allergies as of 02/05/2023 - Review Complete 02/05/2023  Allergen Reaction Noted   Ciprofloxacin Other (See Comments) 02/13/2022   Codeine Other (See Comments)    Sulfa antibiotics Hives 09/19/2021    Family History  Problem Relation Age of Onset   Diabetes Mother    Hypertension Mother    Cancer Father    Colon polyps Father        section of colon removed-unknown if CA related   Diabetes Father    Hypertension Sister    Colon polyps Brother 94   Colon polyps Paternal Grandfather 71   Colon cancer Paternal Grandfather 77   Breast cancer Cousin 45   Esophageal cancer Neg Hx    Rectal cancer Neg Hx    Stomach cancer Neg Hx     Social History   Socioeconomic History   Marital status: Married    Spouse name: Not on file   Number of children: 2    Years of education: Not on file   Highest education level: Not on file  Occupational History   Occupation: Print production planner  Tobacco Use   Smoking status: Never   Smokeless tobacco: Never  Vaping Use   Vaping Use: Never used  Substance and Sexual Activity   Alcohol use: Yes     Comment: social wine   Drug use: No   Sexual activity: Yes    Birth control/protection: Other-see comments    Comment: husband vasectomy   Other Topics Concern   Not on file  Social History Narrative   One caffeine drink weekly    Social Determinants of Health   Financial Resource Strain: Not on file  Food Insecurity: Not on file  Transportation Needs: Not on file  Physical Activity:  Not on file  Stress: Not on file  Social Connections: Not on file  Intimate Partner Violence: Not on file    Review of Systems:  All systems reviewed were negative except where noted in HPI.   Physical Exam: General:  Alert, well-developed, in NAD Head:  Normocephalic and atraumatic. Eyes:  Sclera clear, no icterus.   Conjunctiva pink. Ears:  Normal auditory acuity. Mouth:  No deformity or lesions.  Neck:  Supple; no masses. Lungs:  Clear throughout to auscultation.   No wheezes, crackles, or rhonchi.  Heart:  Regular rate and rhythm; no murmurs. Abdomen:  Soft, nondistended, nontender. No masses, hepatomegaly. No palpable masses.  Normal bowel sounds.    Rectal:  Deferred   Msk:  Symmetrical without gross deformities. Extremities:  Without edema. Neurologic:  Alert and  oriented x 4; grossly normal neurologically. Skin:  Intact without significant lesions or rashes. Psych:  Alert and cooperative. Normal mood and affect.   Venita Lick. Russella Dar  02/05/2023, 8:31 AM See Loretha Stapler, [facility], to contact our on call provider

## 2023-02-06 ENCOUNTER — Telehealth: Payer: Self-pay | Admitting: *Deleted

## 2023-02-06 ENCOUNTER — Other Ambulatory Visit: Payer: Self-pay

## 2023-02-06 MED ORDER — OMEPRAZOLE 40 MG PO CPDR: 40.0000 mg | DELAYED_RELEASE_CAPSULE | Freq: Two times a day (BID) | ORAL | 3 refills | Status: AC

## 2023-02-06 NOTE — Telephone Encounter (Signed)
  Follow up Call-     02/05/2023    7:45 AM 06/20/2021    8:00 AM  Call back number  Post procedure Call Back phone  # [phone]  Permission to leave phone message Yes Yes     Patient questions:  Do you have a fever, pain , or abdominal swelling? No. Pain Score  0 *  Have you tolerated food without any problems? Yes.    Have you been able to return to your normal activities? Yes.    Do you have any questions about your discharge instructions: Diet   No. Medications  No. Follow up visit  No.  Do you have questions or concerns about your Care? No.  Actions: * If pain score is 4 or above: No action needed, pain <4.

## 2023-02-07 ENCOUNTER — Telehealth: Payer: Self-pay | Admitting: Gastroenterology

## 2023-02-07 NOTE — Telephone Encounter (Signed)
PT was prescribed omeprazole and she is concerned. She would like a nurse to discuss this with her. Please advise.

## 2023-02-07 NOTE — Telephone Encounter (Signed)
The pt is calling to ask if she can take omeprazole 20 mg BID instead of 40 mg BID. She thinks that 40 mg BID is "over kill"  She states she does not like to take medications and prefers to take a lesser amount for a shorter period.  She is aware that Dr Russella Dar is out of the office today and will be contacted as soon as able.

## 2023-02-10 NOTE — Telephone Encounter (Signed)
The pt states that she appreciates what Dr Russella Dar has recommended but she prefers to only take 20 mg BID. She will call back if she has any further concerns.

## 2023-02-11 ENCOUNTER — Encounter: Payer: Managed Care, Other (non HMO) | Admitting: Gastroenterology

## 2023-02-14 ENCOUNTER — Encounter: Payer: Self-pay | Admitting: Gastroenterology

## 2023-02-14 ENCOUNTER — Other Ambulatory Visit (HOSPITAL_COMMUNITY): Payer: Self-pay | Admitting: Family Medicine

## 2023-02-14 DIAGNOSIS — Z8249 Family history of ischemic heart disease and other diseases of the circulatory system: Secondary | ICD-10-CM

## 2023-03-07 ENCOUNTER — Ambulatory Visit (HOSPITAL_COMMUNITY)
Admission: RE | Admit: 2023-03-07 | Discharge: 2023-03-07 | Disposition: A | Discharge status: Home / Self Care | Payer: BC Managed Care – PPO | Source: Ambulatory Visit | Attending: Family Medicine | Admitting: Family Medicine

## 2023-03-07 DIAGNOSIS — Z8249 Family history of ischemic heart disease and other diseases of the circulatory system: Secondary | ICD-10-CM | POA: Insufficient documentation

## 2023-05-22 ENCOUNTER — Other Ambulatory Visit: Payer: Self-pay | Admitting: Family Medicine

## 2023-05-22 DIAGNOSIS — Z1231 Encounter for screening mammogram for malignant neoplasm of breast: Secondary | ICD-10-CM

## 2023-06-09 DIAGNOSIS — H353131 Nonexudative age-related macular degeneration, bilateral, early dry stage: Secondary | ICD-10-CM | POA: Diagnosis not present

## 2023-06-09 DIAGNOSIS — H2513 Age-related nuclear cataract, bilateral: Secondary | ICD-10-CM | POA: Diagnosis not present

## 2023-06-09 DIAGNOSIS — H35722 Serous detachment of retinal pigment epithelium, left eye: Secondary | ICD-10-CM | POA: Diagnosis not present

## 2023-06-09 DIAGNOSIS — H35712 Central serous chorioretinopathy, left eye: Secondary | ICD-10-CM | POA: Diagnosis not present

## 2023-06-17 ENCOUNTER — Ambulatory Visit
Admission: RE | Admit: 2023-06-17 | Discharge: 2023-06-17 | Disposition: A | Discharge status: Home / Self Care | Payer: BC Managed Care – PPO | Source: Ambulatory Visit | Attending: Family Medicine | Admitting: Family Medicine

## 2023-06-17 DIAGNOSIS — Z1231 Encounter for screening mammogram for malignant neoplasm of breast: Secondary | ICD-10-CM

## 2023-06-25 DIAGNOSIS — H65192 Other acute nonsuppurative otitis media, left ear: Secondary | ICD-10-CM | POA: Diagnosis not present

## 2023-06-25 DIAGNOSIS — U071 COVID-19: Secondary | ICD-10-CM | POA: Diagnosis not present

## 2023-07-02 ENCOUNTER — Encounter (INDEPENDENT_AMBULATORY_CARE_PROVIDER_SITE_OTHER): Payer: Managed Care, Other (non HMO) | Admitting: Ophthalmology

## 2023-08-04 DIAGNOSIS — H35712 Central serous chorioretinopathy, left eye: Secondary | ICD-10-CM | POA: Diagnosis not present

## 2023-08-04 DIAGNOSIS — H35722 Serous detachment of retinal pigment epithelium, left eye: Secondary | ICD-10-CM | POA: Diagnosis not present

## 2023-08-04 DIAGNOSIS — H353131 Nonexudative age-related macular degeneration, bilateral, early dry stage: Secondary | ICD-10-CM | POA: Diagnosis not present

## 2023-08-04 DIAGNOSIS — H2513 Age-related nuclear cataract, bilateral: Secondary | ICD-10-CM | POA: Diagnosis not present

## 2023-09-03 DIAGNOSIS — L814 Other melanin hyperpigmentation: Secondary | ICD-10-CM | POA: Diagnosis not present

## 2023-09-03 DIAGNOSIS — D229 Melanocytic nevi, unspecified: Secondary | ICD-10-CM | POA: Diagnosis not present

## 2023-09-03 DIAGNOSIS — L821 Other seborrheic keratosis: Secondary | ICD-10-CM | POA: Diagnosis not present

## 2023-09-03 DIAGNOSIS — L941 Linear scleroderma: Secondary | ICD-10-CM | POA: Diagnosis not present

## 2023-09-03 DIAGNOSIS — L820 Inflamed seborrheic keratosis: Secondary | ICD-10-CM | POA: Diagnosis not present

## 2023-09-11 DIAGNOSIS — H353131 Nonexudative age-related macular degeneration, bilateral, early dry stage: Secondary | ICD-10-CM | POA: Diagnosis not present

## 2023-09-11 DIAGNOSIS — H35712 Central serous chorioretinopathy, left eye: Secondary | ICD-10-CM | POA: Diagnosis not present

## 2023-10-08 ENCOUNTER — Other Ambulatory Visit: Payer: Self-pay | Admitting: Gastroenterology

## 2023-11-06 DIAGNOSIS — H35722 Serous detachment of retinal pigment epithelium, left eye: Secondary | ICD-10-CM | POA: Diagnosis not present

## 2023-11-06 DIAGNOSIS — H35712 Central serous chorioretinopathy, left eye: Secondary | ICD-10-CM | POA: Diagnosis not present

## 2023-11-06 DIAGNOSIS — H2513 Age-related nuclear cataract, bilateral: Secondary | ICD-10-CM | POA: Diagnosis not present

## 2023-11-06 DIAGNOSIS — H353131 Nonexudative age-related macular degeneration, bilateral, early dry stage: Secondary | ICD-10-CM | POA: Diagnosis not present

## 2023-11-19 DIAGNOSIS — H35722 Serous detachment of retinal pigment epithelium, left eye: Secondary | ICD-10-CM | POA: Diagnosis not present

## 2023-11-19 DIAGNOSIS — H353131 Nonexudative age-related macular degeneration, bilateral, early dry stage: Secondary | ICD-10-CM | POA: Diagnosis not present

## 2023-11-19 DIAGNOSIS — H35712 Central serous chorioretinopathy, left eye: Secondary | ICD-10-CM | POA: Diagnosis not present

## 2023-11-19 DIAGNOSIS — H2513 Age-related nuclear cataract, bilateral: Secondary | ICD-10-CM | POA: Diagnosis not present

## 2023-12-30 DIAGNOSIS — Z860100 Personal history of colon polyps, unspecified: Secondary | ICD-10-CM | POA: Diagnosis not present

## 2023-12-30 DIAGNOSIS — K219 Gastro-esophageal reflux disease without esophagitis: Secondary | ICD-10-CM | POA: Diagnosis not present

## 2024-01-23 DIAGNOSIS — H35719 Central serous chorioretinopathy, unspecified eye: Secondary | ICD-10-CM | POA: Diagnosis not present

## 2024-01-23 DIAGNOSIS — J0100 Acute maxillary sinusitis, unspecified: Secondary | ICD-10-CM | POA: Diagnosis not present

## 2024-01-23 DIAGNOSIS — J029 Acute pharyngitis, unspecified: Secondary | ICD-10-CM | POA: Diagnosis not present

## 2024-02-12 DIAGNOSIS — H35722 Serous detachment of retinal pigment epithelium, left eye: Secondary | ICD-10-CM | POA: Diagnosis not present

## 2024-02-12 DIAGNOSIS — H35712 Central serous chorioretinopathy, left eye: Secondary | ICD-10-CM | POA: Diagnosis not present

## 2024-02-12 DIAGNOSIS — H353131 Nonexudative age-related macular degeneration, bilateral, early dry stage: Secondary | ICD-10-CM | POA: Diagnosis not present

## 2024-02-12 DIAGNOSIS — H2513 Age-related nuclear cataract, bilateral: Secondary | ICD-10-CM | POA: Diagnosis not present

## 2024-02-23 DIAGNOSIS — K219 Gastro-esophageal reflux disease without esophagitis: Secondary | ICD-10-CM | POA: Diagnosis not present

## 2024-02-23 DIAGNOSIS — J309 Allergic rhinitis, unspecified: Secondary | ICD-10-CM | POA: Diagnosis not present

## 2024-02-23 DIAGNOSIS — Z0000 Encounter for general adult medical examination without abnormal findings: Secondary | ICD-10-CM | POA: Diagnosis not present

## 2024-02-23 DIAGNOSIS — E042 Nontoxic multinodular goiter: Secondary | ICD-10-CM | POA: Diagnosis not present

## 2024-02-23 DIAGNOSIS — E7800 Pure hypercholesterolemia, unspecified: Secondary | ICD-10-CM | POA: Diagnosis not present

## 2024-03-16 DIAGNOSIS — E041 Nontoxic single thyroid nodule: Secondary | ICD-10-CM | POA: Diagnosis not present

## 2024-03-16 DIAGNOSIS — E042 Nontoxic multinodular goiter: Secondary | ICD-10-CM | POA: Diagnosis not present

## 2024-03-22 DIAGNOSIS — R490 Dysphonia: Secondary | ICD-10-CM | POA: Diagnosis not present

## 2024-04-15 DIAGNOSIS — H35712 Central serous chorioretinopathy, left eye: Secondary | ICD-10-CM | POA: Diagnosis not present

## 2024-04-15 DIAGNOSIS — H353131 Nonexudative age-related macular degeneration, bilateral, early dry stage: Secondary | ICD-10-CM | POA: Diagnosis not present

## 2024-06-17 ENCOUNTER — Other Ambulatory Visit: Payer: Self-pay | Admitting: Obstetrics & Gynecology

## 2024-06-17 DIAGNOSIS — H353131 Nonexudative age-related macular degeneration, bilateral, early dry stage: Secondary | ICD-10-CM | POA: Diagnosis not present

## 2024-06-17 DIAGNOSIS — Z1231 Encounter for screening mammogram for malignant neoplasm of breast: Secondary | ICD-10-CM

## 2024-06-17 DIAGNOSIS — H35722 Serous detachment of retinal pigment epithelium, left eye: Secondary | ICD-10-CM | POA: Diagnosis not present

## 2024-06-17 DIAGNOSIS — H2513 Age-related nuclear cataract, bilateral: Secondary | ICD-10-CM | POA: Diagnosis not present

## 2024-06-17 DIAGNOSIS — H35712 Central serous chorioretinopathy, left eye: Secondary | ICD-10-CM | POA: Diagnosis not present

## 2024-06-24 DIAGNOSIS — K219 Gastro-esophageal reflux disease without esophagitis: Secondary | ICD-10-CM | POA: Diagnosis not present

## 2024-07-01 ENCOUNTER — Ambulatory Visit
Admission: RE | Admit: 2024-07-01 | Discharge: 2024-07-01 | Disposition: A | Discharge status: Home / Self Care | Source: Ambulatory Visit | Attending: Obstetrics & Gynecology | Admitting: Obstetrics & Gynecology

## 2024-07-01 DIAGNOSIS — Z1231 Encounter for screening mammogram for malignant neoplasm of breast: Secondary | ICD-10-CM
